# Patient Record
Sex: Female | Born: 1945
Health system: Southern US, Community
[De-identification: ages and names within clinical notes are randomized; demographics above are authoritative.]

## PROBLEM LIST (undated history)

## (undated) DIAGNOSIS — M069 Rheumatoid arthritis, unspecified: Secondary | ICD-10-CM

## (undated) DIAGNOSIS — F32A Depression, unspecified: Secondary | ICD-10-CM

## (undated) DIAGNOSIS — M1990 Unspecified osteoarthritis, unspecified site: Secondary | ICD-10-CM

## (undated) DIAGNOSIS — E559 Vitamin D deficiency, unspecified: Secondary | ICD-10-CM

## (undated) DIAGNOSIS — F419 Anxiety disorder, unspecified: Secondary | ICD-10-CM

## (undated) DIAGNOSIS — N390 Urinary tract infection, site not specified: Secondary | ICD-10-CM

## (undated) DIAGNOSIS — F410 Panic disorder [episodic paroxysmal anxiety] without agoraphobia: Secondary | ICD-10-CM

## (undated) DIAGNOSIS — K219 Gastro-esophageal reflux disease without esophagitis: Secondary | ICD-10-CM

## (undated) DIAGNOSIS — F329 Major depressive disorder, single episode, unspecified: Secondary | ICD-10-CM

## (undated) DIAGNOSIS — J111 Influenza due to unidentified influenza virus with other respiratory manifestations: Secondary | ICD-10-CM

## (undated) HISTORY — PX: COLONOSCOPY: SHX174

## (undated) HISTORY — DX: Vitamin D deficiency, unspecified: E55.9

## (undated) HISTORY — PX: ABDOMINAL HYSTERECTOMY: SHX81

## (undated) HISTORY — PX: OTHER SURGICAL HISTORY: SHX169

## (undated) HISTORY — PX: BRONCHOSCOPY: SUR163

## (undated) HISTORY — DX: Panic disorder (episodic paroxysmal anxiety): F41.0

---

## 1997-05-22 ENCOUNTER — Other Ambulatory Visit: Admission: RE | Admit: 1997-05-22 | Discharge: 1997-05-22 | Payer: Self-pay | Admitting: Gynecology

## 1998-08-12 ENCOUNTER — Other Ambulatory Visit: Admission: RE | Admit: 1998-08-12 | Discharge: 1998-08-12 | Payer: Self-pay | Admitting: Gynecology

## 1998-11-15 ENCOUNTER — Encounter: Payer: Self-pay | Admitting: Gynecology

## 1998-11-15 ENCOUNTER — Ambulatory Visit (HOSPITAL_COMMUNITY): Admission: RE | Admit: 1998-11-15 | Discharge: 1998-11-15 | Payer: Self-pay | Admitting: Gynecology

## 1999-10-29 ENCOUNTER — Other Ambulatory Visit: Admission: RE | Admit: 1999-10-29 | Discharge: 1999-10-29 | Payer: Self-pay | Admitting: Gynecology

## 2000-03-03 ENCOUNTER — Encounter: Payer: Self-pay | Admitting: Gynecology

## 2000-03-03 ENCOUNTER — Ambulatory Visit (HOSPITAL_COMMUNITY): Admission: RE | Admit: 2000-03-03 | Discharge: 2000-03-03 | Payer: Self-pay | Admitting: Gynecology

## 2000-10-28 ENCOUNTER — Other Ambulatory Visit: Admission: RE | Admit: 2000-10-28 | Discharge: 2000-10-28 | Payer: Self-pay | Admitting: Gynecology

## 2001-11-10 ENCOUNTER — Other Ambulatory Visit: Admission: RE | Admit: 2001-11-10 | Discharge: 2001-11-10 | Payer: Self-pay | Admitting: Gynecology

## 2002-06-09 ENCOUNTER — Encounter (INDEPENDENT_AMBULATORY_CARE_PROVIDER_SITE_OTHER): Payer: Self-pay | Admitting: *Deleted

## 2002-06-09 ENCOUNTER — Ambulatory Visit (HOSPITAL_COMMUNITY): Admission: RE | Admit: 2002-06-09 | Discharge: 2002-06-09 | Payer: Self-pay | Admitting: *Deleted

## 2002-06-13 ENCOUNTER — Encounter: Payer: Self-pay | Admitting: Gynecology

## 2002-06-13 ENCOUNTER — Ambulatory Visit (HOSPITAL_COMMUNITY): Admission: RE | Admit: 2002-06-13 | Discharge: 2002-06-13 | Payer: Self-pay | Admitting: Gynecology

## 2003-04-25 ENCOUNTER — Other Ambulatory Visit: Admission: RE | Admit: 2003-04-25 | Discharge: 2003-04-25 | Payer: Self-pay | Admitting: Gynecology

## 2003-06-14 ENCOUNTER — Ambulatory Visit (HOSPITAL_COMMUNITY): Admission: RE | Admit: 2003-06-14 | Discharge: 2003-06-14 | Payer: Self-pay | Admitting: Gynecology

## 2004-04-24 ENCOUNTER — Other Ambulatory Visit: Admission: RE | Admit: 2004-04-24 | Discharge: 2004-04-24 | Payer: Self-pay | Admitting: Gynecology

## 2004-09-29 ENCOUNTER — Ambulatory Visit (HOSPITAL_COMMUNITY): Admission: RE | Admit: 2004-09-29 | Discharge: 2004-09-29 | Payer: Self-pay | Admitting: Gynecology

## 2005-10-01 ENCOUNTER — Ambulatory Visit (HOSPITAL_COMMUNITY): Admission: RE | Admit: 2005-10-01 | Discharge: 2005-10-01 | Payer: Self-pay | Admitting: Gynecology

## 2005-10-07 ENCOUNTER — Other Ambulatory Visit: Admission: RE | Admit: 2005-10-07 | Discharge: 2005-10-07 | Payer: Self-pay | Admitting: Gynecology

## 2006-10-26 ENCOUNTER — Ambulatory Visit (HOSPITAL_COMMUNITY): Admission: RE | Admit: 2006-10-26 | Discharge: 2006-10-26 | Payer: Self-pay | Admitting: Gynecology

## 2007-04-05 ENCOUNTER — Other Ambulatory Visit: Admission: RE | Admit: 2007-04-05 | Discharge: 2007-04-05 | Payer: Self-pay | Admitting: Gynecology

## 2007-10-24 ENCOUNTER — Emergency Department (HOSPITAL_BASED_OUTPATIENT_CLINIC_OR_DEPARTMENT_OTHER): Admission: EM | Admit: 2007-10-24 | Discharge: 2007-10-24 | Payer: Self-pay | Admitting: Emergency Medicine

## 2010-02-13 ENCOUNTER — Ambulatory Visit (HOSPITAL_COMMUNITY)
Admission: RE | Admit: 2010-02-13 | Discharge: 2010-02-13 | Payer: Self-pay | Source: Home / Self Care | Attending: Gynecology | Admitting: Gynecology

## 2010-11-19 LAB — DIFFERENTIAL
Basophils Absolute: 0
Eosinophils Absolute: 0.1
Eosinophils Relative: 2
Lymphocytes Relative: 24
Monocytes Absolute: 0.4

## 2010-11-19 LAB — COMPREHENSIVE METABOLIC PANEL
ALT: 40 — ABNORMAL HIGH
AST: 50 — ABNORMAL HIGH
Albumin: 4.3
Alkaline Phosphatase: 104
Chloride: 105
Creatinine, Ser: 0.8
GFR calc Af Amer: >60
Potassium: 4.7
Sodium: 141
Total Bilirubin: 0.4

## 2010-11-19 LAB — POCT CARDIAC MARKERS
Troponin i, poc: <0.05
Troponin i, poc: <0.05

## 2010-11-19 LAB — CBC
Platelets: 175
RBC: 4.3
WBC: 4.6

## 2011-02-02 ENCOUNTER — Other Ambulatory Visit (HOSPITAL_COMMUNITY): Payer: Self-pay | Admitting: Gynecology

## 2011-02-02 DIAGNOSIS — Z1231 Encounter for screening mammogram for malignant neoplasm of breast: Secondary | ICD-10-CM

## 2011-02-13 ENCOUNTER — Other Ambulatory Visit: Payer: Self-pay

## 2011-02-13 ENCOUNTER — Emergency Department (HOSPITAL_COMMUNITY)
Admission: EM | Admit: 2011-02-13 | Discharge: 2011-02-13 | Disposition: A | Discharge status: Home / Self Care | Payer: Medicare Other | Attending: Emergency Medicine | Admitting: Emergency Medicine

## 2011-02-13 ENCOUNTER — Emergency Department (HOSPITAL_COMMUNITY): Payer: Medicare Other

## 2011-02-13 DIAGNOSIS — Z7982 Long term (current) use of aspirin: Secondary | ICD-10-CM | POA: Insufficient documentation

## 2011-02-13 DIAGNOSIS — Z79899 Other long term (current) drug therapy: Secondary | ICD-10-CM | POA: Insufficient documentation

## 2011-02-13 DIAGNOSIS — M129 Arthropathy, unspecified: Secondary | ICD-10-CM | POA: Insufficient documentation

## 2011-02-13 DIAGNOSIS — R002 Palpitations: Secondary | ICD-10-CM

## 2011-02-13 DIAGNOSIS — R42 Dizziness and giddiness: Secondary | ICD-10-CM | POA: Insufficient documentation

## 2011-02-13 HISTORY — DX: Unspecified osteoarthritis, unspecified site: M19.90

## 2011-02-13 LAB — URINALYSIS, ROUTINE W REFLEX MICROSCOPIC
Glucose, UA: NEGATIVE mg/dL
Specific Gravity, Urine: 1.003 — ABNORMAL LOW (ref 1.005–1.030)
pH: 7 (ref 5.0–8.0)

## 2011-02-13 LAB — CBC
HCT: 42.5 % (ref 36.0–46.0)
Platelets: 163 10*3/uL (ref 150–400)
RDW: 13.1 % (ref 11.5–15.5)
WBC: 7 10*3/uL (ref 4.0–10.5)

## 2011-02-13 LAB — BASIC METABOLIC PANEL
CO2: 27 mEq/L (ref 19–32)
Chloride: 106 mEq/L (ref 96–112)
Sodium: 143 mEq/L (ref 135–145)

## 2011-02-13 LAB — DIFFERENTIAL
Basophils Absolute: 0 10*3/uL (ref 0.0–0.1)
Lymphocytes Relative: 23 % (ref 12–46)
Monocytes Absolute: 0.5 10*3/uL (ref 0.1–1.0)
Neutro Abs: 4.8 10*3/uL (ref 1.7–7.7)

## 2011-02-13 LAB — URINE MICROSCOPIC-ADD ON

## 2011-02-13 LAB — POCT I-STAT TROPONIN I

## 2011-02-13 LAB — TSH: TSH: 0.907 u[IU]/mL (ref 0.350–4.500)

## 2011-02-13 NOTE — ED Provider Notes (Cosign Needed)
History     CSN: [id number]  Arrival date & time 02/13/11  1503   None     Chief Complaint  Patient presents with  . Dizziness  . Irregular Heart Beat    (Consider location/radiation/quality/duration/timing/severity/associated sxs/prior treatment) HPI Comments: The patient is a 65 year old woman who says she became very dizzy. It was a weird feeling. She's has an application on her cell phone which checks the pulse. She says her pulse was jumping between 40 and 180. Paramedics came and he said her pulse was followed with place also. 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 She feels better now. She says several months ago she had an episode where she felt kind of dizzy, and her pulse 102 150 but this resolved, and she did not seek medical evaluation at that time.  Patient is a 65 y.o. female presenting with palpitations.  Palpitations  This is a new problem. The current episode started less than 1 hour ago. Episode frequency: She had one episode of palpitations. The problem has been resolved. On average, each episode lasts 30 minutes. Associated symptoms include dizziness. She has tried nothing for the symptoms. Risk factors include no known risk factors (One sister has had atrial fibrillation.). Past medical history comments: One sister has thyroid problems..    Past Medical History  Diagnosis Date  . Arthritis     History reviewed. No pertinent past surgical history.  History reviewed. No pertinent family history.  History  Substance Use Topics  . Smoking status: Never Smoker   . Smokeless tobacco: Never Used  . Alcohol Use: Yes    OB History    Grav Para Term Preterm Abortions TAB SAB Ect Mult Living                  Review of Systems  Constitutional: Negative.   HENT: Negative.   Eyes: Negative.   Respiratory: Negative.   Cardiovascular: Positive for palpitations.  Gastrointestinal: Negative.   Genitourinary:  Negative.   Skin: Negative.   Neurological: Positive for dizziness.  Psychiatric/Behavioral: Negative.     Allergies  Review of patient's allergies indicates no known allergies.  Home Medications   Current Outpatient Rx  Name Route Sig Dispense Refill  . VITAMIN C PO Oral Take 1 tablet by mouth daily.      . ASPIRIN EC 325 MG PO TBEC Oral Take 325 mg by mouth daily.      Marland Kitchen CALCIUM PO Oral Take 1 tablet by mouth daily.      Marland Kitchen VITAMIN D 1000 UNITS PO TABS Oral Take 1,000 Units by mouth daily.      Marland Kitchen HYDROXYCHLOROQUINE SULFATE 200 MG PO TABS Oral Take by mouth daily.        BP 125/82  Pulse 93  Temp(Src) 98.1 F (36.7 C) (Oral)  Resp 20  SpO2 99%  Physical Exam  Nursing note and vitals reviewed. Constitutional: She is oriented to person, place, and time. She appears well-developed and well-nourished. No distress.  HENT:  Head: Normocephalic and atraumatic.  Right Ear: External ear normal.  Left Ear: External ear normal.  Mouth/Throat: Oropharynx is clear and moist.  Eyes: Conjunctivae and EOM are normal. Pupils are equal, round, and reactive to light.  Neck: Normal range of motion. Neck supple.       No carotid bruits.  Cardiovascular: Normal rate, regular rhythm and normal heart sounds.   Pulmonary/Chest: Effort normal and breath sounds normal.  Abdominal: Soft. Bowel sounds are normal.  Musculoskeletal: Normal range of motion.  Neurological: She is alert and oriented to person, place, and time.       Sensory or motor deficit.  Skin: Skin is warm and dry.  Psychiatric: She has a normal mood and affect. Her behavior is normal.    ED Course  Procedures (including critical care time)   Labs Reviewed  CBC  DIFFERENTIAL  BASIC METABOLIC PANEL  I-STAT TROPONIN I    3:27 PM  Date: 02/13/2011  Rate:87  Rhythm: normal sinus rhythm  QRS Axis: normal  Intervals: normal PQRS:  Left atrial abnormality  ST/T Wave abnormalities: normal  Conduction Disutrbances:none   Narrative Interpretation: Borderline EKG.  Old EKG Reviewed: unchanged Since tracing of 10/24/2007.  3:54 PM There were no tracings from the paramedics available to review. Review of her current monitoring shows no episodes of tachycardia. Laboratory evaluation was ordered.  5:20 PM Issues lab workup was negative. She has been asymptomatic throughout her visit. I will call [facility] to set up a time for her to be checked and possibly to have an event monitor placed.  1. Palpitations            Carleene Cooper III, MD 02/13/11 1730  Carleene Cooper III, MD 02/13/11 [phone]

## 2011-02-13 NOTE — ED Notes (Signed)
Patient was at work when she had onset of weakness and dizziness that she associated with an irregular feeling heartbeat. She states that at one point she felt very anxious and felt like there was some mild chest tightness and stated that she felt like she was having some pressure in her neck. With ems she was in atrial fib which is a new rhythm for her. Her rate ranged anywhere from 80-140 with ems. Pt already has taken 325mg  asa at home with her regular medications. She is alert and oriented. Iv started pta by ems. Protocols initiated upon arrival to dept.

## 2011-02-13 NOTE — ED Notes (Signed)
Patient was at work when she had onset of dizziness and weakness around 2pm today. Upon ems arrival her cbg was 79 but she was found to be in a-fib on their monitor. She denies any pain. Pt states that she just didn't feel right. Iv started pta by ems. Protocols initiated upon arrival to dept.

## 2011-02-26 ENCOUNTER — Ambulatory Visit (HOSPITAL_COMMUNITY): Payer: Medicare Other

## 2011-03-26 ENCOUNTER — Ambulatory Visit (HOSPITAL_COMMUNITY): Payer: Medicare Other

## 2011-04-07 ENCOUNTER — Ambulatory Visit (HOSPITAL_COMMUNITY)
Admission: RE | Admit: 2011-04-07 | Discharge: 2011-04-07 | Disposition: A | Discharge status: Home / Self Care | Payer: Medicare Other | Source: Ambulatory Visit | Attending: Gynecology | Admitting: Gynecology

## 2011-04-07 DIAGNOSIS — Z1231 Encounter for screening mammogram for malignant neoplasm of breast: Secondary | ICD-10-CM | POA: Insufficient documentation

## 2012-08-15 ENCOUNTER — Other Ambulatory Visit (HOSPITAL_COMMUNITY): Payer: Self-pay | Admitting: Gynecology

## 2012-08-15 DIAGNOSIS — Z1231 Encounter for screening mammogram for malignant neoplasm of breast: Secondary | ICD-10-CM

## 2012-08-30 ENCOUNTER — Ambulatory Visit (HOSPITAL_COMMUNITY)
Admission: RE | Admit: 2012-08-30 | Discharge: 2012-08-30 | Disposition: A | Discharge status: Home / Self Care | Payer: Medicare Other | Source: Ambulatory Visit | Attending: Gynecology | Admitting: Gynecology

## 2012-08-30 DIAGNOSIS — Z1231 Encounter for screening mammogram for malignant neoplasm of breast: Secondary | ICD-10-CM

## 2012-12-03 ENCOUNTER — Emergency Department (HOSPITAL_BASED_OUTPATIENT_CLINIC_OR_DEPARTMENT_OTHER)
Admission: EM | Admit: 2012-12-03 | Discharge: 2012-12-03 | Disposition: A | Discharge status: Home / Self Care | Payer: Medicare Other | Attending: Emergency Medicine | Admitting: Emergency Medicine

## 2012-12-03 ENCOUNTER — Encounter (HOSPITAL_BASED_OUTPATIENT_CLINIC_OR_DEPARTMENT_OTHER): Payer: Self-pay | Admitting: Emergency Medicine

## 2012-12-03 ENCOUNTER — Emergency Department (HOSPITAL_BASED_OUTPATIENT_CLINIC_OR_DEPARTMENT_OTHER): Payer: Medicare Other

## 2012-12-03 DIAGNOSIS — N390 Urinary tract infection, site not specified: Secondary | ICD-10-CM | POA: Insufficient documentation

## 2012-12-03 DIAGNOSIS — R109 Unspecified abdominal pain: Secondary | ICD-10-CM

## 2012-12-03 DIAGNOSIS — Z79899 Other long term (current) drug therapy: Secondary | ICD-10-CM | POA: Insufficient documentation

## 2012-12-03 DIAGNOSIS — M129 Arthropathy, unspecified: Secondary | ICD-10-CM | POA: Insufficient documentation

## 2012-12-03 LAB — COMPREHENSIVE METABOLIC PANEL
ALT: 59 U/L — ABNORMAL HIGH (ref 0–35)
AST: 65 U/L — ABNORMAL HIGH (ref 0–37)
Alkaline Phosphatase: 86 U/L (ref 39–117)
CO2: 24 mEq/L (ref 19–32)
Chloride: 101 mEq/L (ref 96–112)
GFR calc non Af Amer: 74 mL/min — ABNORMAL LOW (ref >90)
Sodium: 137 mEq/L (ref 135–145)
Total Bilirubin: 0.6 mg/dL (ref 0.3–1.2)

## 2012-12-03 LAB — CBC WITH DIFFERENTIAL/PLATELET
Basophils Absolute: 0 10*3/uL (ref 0.0–0.1)
HCT: 40.3 % (ref 36.0–46.0)
Lymphocytes Relative: 33 % (ref 12–46)
Neutro Abs: 3.1 10*3/uL (ref 1.7–7.7)
Platelets: 191 10*3/uL (ref 150–400)
RBC: 4.37 MIL/uL (ref 3.87–5.11)
RDW: 12.9 % (ref 11.5–15.5)
WBC: 5.5 10*3/uL (ref 4.0–10.5)

## 2012-12-03 LAB — URINALYSIS, ROUTINE W REFLEX MICROSCOPIC
Glucose, UA: NEGATIVE mg/dL
Protein, ur: NEGATIVE mg/dL
Specific Gravity, Urine: 1.005 (ref 1.005–1.030)

## 2012-12-03 LAB — URINE MICROSCOPIC-ADD ON

## 2012-12-03 MED ORDER — IOHEXOL 300 MG/ML  SOLN
100.0000 mL | Freq: Once | INTRAMUSCULAR | Status: AC | PRN
  Administered 2012-12-03: 100 mL via INTRAVENOUS

## 2012-12-03 MED ORDER — IOHEXOL 300 MG/ML  SOLN
50.0000 mL | Freq: Once | INTRAMUSCULAR | Status: AC | PRN
  Administered 2012-12-03: 50 mL via ORAL

## 2012-12-03 MED ORDER — SODIUM CHLORIDE 0.9 % IV BOLUS (SEPSIS)
500.0000 mL | Freq: Once | INTRAVENOUS | Status: AC
  Administered 2012-12-03: 500 mL via INTRAVENOUS

## 2012-12-03 MED ORDER — CEPHALEXIN 500 MG PO CAPS: ORAL_CAPSULE | ORAL | Status: DC

## 2012-12-03 NOTE — ED Notes (Signed)
Pt having abdominal pain and bloating since august.  Pt seen numerous times at [facility].  Pt treated for UTI.  Pt had abdominal xray.  Pt continues to have cramping.  Pt took laxative, had good results, but continues with cramping.

## 2012-12-03 NOTE — ED Provider Notes (Signed)
CSN: [id number]     Arrival date & time 12/03/12  1539 History  This chart was scribed for Hurman Horn, MD by Danella Maiers, ED Scribe. This patient was seen in room MH03/MH03 and the patient's care was started at 4:03 PM.   Chief Complaint  Patient presents with  . Abdominal Pain  . Dysuria   HPI HPI Comments: [name] [name] is a 67 y.o. female who presents to the Emergency Department complaining of dull, intermittent abdominal pain that radiates to the back for the past few months that worsened for a few hours yesterday but is now improved. She explains it as a "pressure" pain. It is mild, diffuse, lasts from hours at a time to all day. She has noticed a change in her bowel habits over the last month where she has had small caliber stools in a much smaller amount. She had an abdominal x-ray that was normal except for constipation and a pelvic US that was normal. She also reports dysuria and has recently been treated by her PCP for UTI. She denies emesis, diarrhea, blood in her stool, CP, SOB, fevers, hallucinations. She is otherwise healthy. She denies operations on her abdomen. She denies alcohol or tobacco use.    Past Medical History  Diagnosis Date  . Arthritis    History reviewed. No pertinent past surgical history. History reviewed. No pertinent family history. History  Substance Use Topics  . Smoking status: Never Smoker   . Smokeless tobacco: Never Used  . Alcohol Use: Yes   OB History   Grav Para Term Preterm Abortions TAB SAB Ect Mult Living                 Review of Systems 10 Systems reviewed and are negative for acute change except as noted in the HPI. Allergies  Review of patient's allergies indicates no known allergies.  Home Medications   Current Outpatient Rx  Name  Route  Sig  Dispense  Refill  . Ascorbic Acid (VITAMIN C PO)   Oral   Take 1 tablet by mouth daily.           Marland Kitchen CALCIUM PO   Oral   Take 1 tablet by mouth daily.           .  cephALEXin (KEFLEX) 500 MG capsule      2 caps po bid x 7 days   28 capsule   0   . cholecalciferol (VITAMIN D) 1000 UNITS tablet   Oral   Take 1,000 Units by mouth daily.           . hydroxychloroquine (PLAQUENIL) 200 MG tablet   Oral   Take by mouth daily.            BP 139/86  Pulse 93  Temp(Src) 98.1 F (36.7 C) (Oral)  Resp 16  Ht 5\' 8"  (1.727 m)  Wt 148 lb (67.132 kg)  BMI 22.51 kg/m2  SpO2 99% Physical Exam  Nursing note and vitals reviewed. Constitutional:  Awake, alert, nontoxic appearance.  HENT:  Head: Atraumatic.  Eyes: Right eye exhibits no discharge. Left eye exhibits no discharge.  Neck: Neck supple.  Cardiovascular: Normal rate and regular rhythm.   No murmur heard. Pulmonary/Chest: Effort normal and breath sounds normal. No respiratory distress. She has no wheezes. She has no rales. She exhibits no tenderness.  Abdominal: Soft. Bowel sounds are normal. She exhibits no distension and no mass. There is no tenderness. There is no rebound and  no guarding.  Genitourinary:  No CVA tenderness  Musculoskeletal: She exhibits no tenderness.  Baseline ROM, no obvious new focal weakness. Back nontender  Neurological:  Mental status and motor strength appears baseline for patient and situation.  Skin: No rash noted.  Psychiatric: She has a normal mood and affect.    ED Course  Procedures (including critical care time) Patient / Family / Caregiver understand and agree with initial ED impression and plan with expectations set for ED visit. Pt prefers CT while in ED. Medications  sodium chloride 0.9 % bolus 500 mL (500 mLs Intravenous New Bag/Given 12/03/12 1654)  iohexol (OMNIPAQUE) 300 MG/ML solution 50 mL (50 mLs Oral Contrast Given 12/03/12 1734)  iohexol (OMNIPAQUE) 300 MG/ML solution 100 mL (100 mLs Intravenous Contrast Given 12/03/12 1734)    DIAGNOSTIC STUDIES: Oxygen Saturation is 99% on RA, normal by my interpretation.    COORDINATION OF  CARE: 4:10 PM- Discussed treatment plan with pt which includes CT abdomen. Pt agrees to plan.    Labs Review Labs Reviewed  URINALYSIS, ROUTINE W REFLEX MICROSCOPIC - Abnormal; Notable for the following:    APPearance CLOUDY (*)    Hgb urine dipstick TRACE (*)    Leukocytes, UA LARGE (*)    All other components within normal limits  COMPREHENSIVE METABOLIC PANEL - Abnormal; Notable for the following:    AST 65 (*)    ALT 59 (*)    GFR calc non Af Amer 74 (*)    GFR calc Af Amer 86 (*)    All other components within normal limits  URINE MICROSCOPIC-ADD ON - Abnormal; Notable for the following:    Bacteria, UA MANY (*)    All other components within normal limits  URINE CULTURE  CBC WITH DIFFERENTIAL  LIPASE, BLOOD   Imaging Review No results found.  EKG Interpretation   None       MDM   1. Abdominal pain   2. UTI (urinary tract infection)    I doubt any other EMC precluding discharge at this time including, but not necessarily limited to the following:peritonitis.    I personally performed the services described in this documentation, which was scribed in my presence. The recorded information has been reviewed and is accurate.    Hurman Horn, MD 12/18/12 2037

## 2012-12-05 LAB — URINE CULTURE: Culture: NO GROWTH

## 2012-12-12 ENCOUNTER — Encounter: Payer: Self-pay | Admitting: Gastroenterology

## 2012-12-22 ENCOUNTER — Other Ambulatory Visit: Payer: Self-pay

## 2012-12-26 ENCOUNTER — Ambulatory Visit: Payer: Medicare Other | Admitting: Gastroenterology

## 2014-05-14 ENCOUNTER — Other Ambulatory Visit (HOSPITAL_COMMUNITY): Payer: Self-pay | Admitting: Gynecology

## 2014-05-14 DIAGNOSIS — Z1231 Encounter for screening mammogram for malignant neoplasm of breast: Secondary | ICD-10-CM

## 2014-05-18 ENCOUNTER — Ambulatory Visit (HOSPITAL_COMMUNITY)
Admission: RE | Admit: 2014-05-18 | Discharge: 2014-05-18 | Disposition: A | Discharge status: Home / Self Care | Payer: Medicare Other | Source: Ambulatory Visit | Attending: Gynecology | Admitting: Gynecology

## 2014-05-18 DIAGNOSIS — Z1231 Encounter for screening mammogram for malignant neoplasm of breast: Secondary | ICD-10-CM | POA: Insufficient documentation

## 2014-10-04 ENCOUNTER — Observation Stay (HOSPITAL_BASED_OUTPATIENT_CLINIC_OR_DEPARTMENT_OTHER)
Admission: EM | Admit: 2014-10-04 | Discharge: 2014-10-05 | Disposition: A | Discharge status: Home / Self Care | Payer: Medicare Other | Attending: Cardiology | Admitting: Cardiology

## 2014-10-04 ENCOUNTER — Encounter (HOSPITAL_BASED_OUTPATIENT_CLINIC_OR_DEPARTMENT_OTHER): Payer: Self-pay | Admitting: Emergency Medicine

## 2014-10-04 ENCOUNTER — Emergency Department (HOSPITAL_BASED_OUTPATIENT_CLINIC_OR_DEPARTMENT_OTHER): Payer: Medicare Other

## 2014-10-04 DIAGNOSIS — R079 Chest pain, unspecified: Secondary | ICD-10-CM

## 2014-10-04 DIAGNOSIS — R42 Dizziness and giddiness: Principal | ICD-10-CM

## 2014-10-04 DIAGNOSIS — R55 Syncope and collapse: Secondary | ICD-10-CM | POA: Diagnosis present

## 2014-10-04 DIAGNOSIS — R9431 Abnormal electrocardiogram [ECG] [EKG]: Secondary | ICD-10-CM

## 2014-10-04 HISTORY — DX: Anxiety disorder, unspecified: F41.9

## 2014-10-04 LAB — CBC
HCT: 41.3 % (ref 36.0–46.0)
Hemoglobin: 14 g/dL (ref 12.0–15.0)
MCH: 32.4 pg (ref 26.0–34.0)
MCHC: 33.9 g/dL (ref 30.0–36.0)
MCV: 95.6 fL (ref 78.0–100.0)
Platelets: 166 10*3/uL (ref 150–400)
RBC: 4.32 MIL/uL (ref 3.87–5.11)
RDW: 13.5 % (ref 11.5–15.5)
WBC: 5.9 10*3/uL (ref 4.0–10.5)

## 2014-10-04 LAB — BASIC METABOLIC PANEL
ANION GAP: 13 (ref 5–15)
BUN: 13 mg/dL (ref 6–20)
CHLORIDE: 105 mmol/L (ref 101–111)
CO2: 21 mmol/L — AB (ref 22–32)
Calcium: 9.1 mg/dL (ref 8.9–10.3)
Creatinine, Ser: 0.67 mg/dL (ref 0.44–1.00)
GFR calc non Af Amer: >60 mL/min (ref >60)
GLUCOSE: 91 mg/dL (ref 65–99)
POTASSIUM: 3.8 mmol/L (ref 3.5–5.1)
Sodium: 139 mmol/L (ref 135–145)

## 2014-10-04 LAB — TROPONIN I: Troponin I: <0.03 ng/mL (ref <0.031)

## 2014-10-04 MED ORDER — ASPIRIN 81 MG PO CHEW
324.0000 mg | CHEWABLE_TABLET | Freq: Once | ORAL | Status: AC
  Administered 2014-10-04: 324 mg via ORAL
  Filled 2014-10-04: qty 4

## 2014-10-04 MED ORDER — NITROGLYCERIN 2 % TD OINT
1.0000 [in_us] | TOPICAL_OINTMENT | Freq: Once | TRANSDERMAL | Status: AC
  Administered 2014-10-04: 1 [in_us] via TOPICAL
  Filled 2014-10-04: qty 1

## 2014-10-04 MED ORDER — ACETAMINOPHEN 500 MG PO TABS
1000.0000 mg | ORAL_TABLET | Freq: Once | ORAL | Status: AC
  Administered 2014-10-04: 1000 mg via ORAL
  Filled 2014-10-04: qty 2

## 2014-10-04 NOTE — ED Notes (Signed)
MD at bedside. 

## 2014-10-04 NOTE — ED Provider Notes (Signed)
CSN: [id number]     Arrival date & time 10/04/14  1736 History   First MD Initiated Contact with Patient 10/04/14 1740     Chief Complaint  Patient presents with  . Weakness     (Consider location/radiation/quality/duration/timing/severity/associated sxs/prior Treatment) HPI Comments: Was driving home about 45 minutes ago and had acute onset dizziness with associated L arm weakness and jaw pressure. Was hypertensive at home in the 170s.  No medical history other than some depression. She is not on any meds for HTN, HLD.   Patient is a 69 y.o. female presenting with weakness. The history is provided by the patient.  Weakness This is a new problem. The problem occurs constantly. The problem has been rapidly improving. Pertinent negatives include no chest pain and no shortness of breath. Nothing aggravates the symptoms. Nothing relieves the symptoms.    Past Medical History  Diagnosis Date  . Arthritis    History reviewed. No pertinent past surgical history. No family history on file. Social History  Substance Use Topics  . Smoking status: Never Smoker   . Smokeless tobacco: Never Used  . Alcohol Use: Yes   OB History    No data available     Review of Systems  Constitutional: Negative for fever.  Respiratory: Negative for shortness of breath.   Cardiovascular: Negative for chest pain and leg swelling.  Neurological: Positive for weakness.  All other systems reviewed and are negative.     Allergies  Review of patient's allergies indicates no known allergies.  Home Medications   Prior to Admission medications   Medication Sig Start Date End Date Taking? Authorizing Provider  citalopram (CELEXA) 10 MG tablet Take 10 mg by mouth daily.   Yes Historical Provider, MD  Ascorbic Acid (VITAMIN C PO) Take 1 tablet by mouth daily.      Historical Provider, MD  CALCIUM PO Take 1 tablet by mouth daily.      Historical Provider, MD  cephALEXin (KEFLEX) 500 MG capsule 2 caps po  bid x 7 days 12/03/12   Riki Altes, MD  cholecalciferol (VITAMIN D) 1000 UNITS tablet Take 1,000 Units by mouth daily.      Historical Provider, MD  hydroxychloroquine (PLAQUENIL) 200 MG tablet Take by mouth daily.      Historical Provider, MD   BP 174/99 mmHg  Pulse 86  Temp(Src) 98.1 F (36.7 C) (Oral)  Resp 16  Ht 5\' 8"  (1.727 m)  Wt 145 lb (65.772 kg)  BMI 22.05 kg/m2  SpO2 100% Physical Exam  Constitutional: She is oriented to person, place, and time. She appears well-developed and well-nourished. No distress.  HENT:  Head: Normocephalic and atraumatic.  Mouth/Throat: Oropharynx is clear and moist.  Eyes: EOM are normal. Pupils are equal, round, and reactive to light.  Neck: Normal range of motion. Neck supple.  Cardiovascular: Normal rate and regular rhythm.  Exam reveals no friction rub.   No murmur heard. Pulmonary/Chest: Effort normal and breath sounds normal. No respiratory distress. She has no wheezes. She has no rales.  Abdominal: Soft. She exhibits no distension. There is no tenderness. There is no rebound.  Musculoskeletal: Normal range of motion. She exhibits no edema.  Neurological: She is alert and oriented to person, place, and time.  Skin: No rash noted. She is not diaphoretic.  Nursing note and vitals reviewed.   ED Course  Procedures (including critical care time) Labs Review Labs Reviewed  CBC  BASIC METABOLIC PANEL  TROPONIN I  Imaging Review Dg Chest 2 View  10/04/2014   CLINICAL DATA:  Hypertension and syncope  EXAM: CHEST  2 VIEW  COMPARISON:  February 13, 2011  FINDINGS: There is no edema or consolidation. The heart size and pulmonary vascularity are normal. No adenopathy. No bone lesions.  IMPRESSION: No edema or consolidation.   Electronically Signed   By: Lowella Grip III M.D.   On: 10/04/2014 18:40   I have personally reviewed and evaluated these images and lab results as part of my medical decision-making.   EKG  Interpretation   Date/Time:  Thursday October 04 2014 17:47:30 EDT Ventricular Rate:  86 PR Interval:  138 QRS Duration: 88 QT Interval:  374 QTC Calculation: 447 R Axis:   39 Text Interpretation:  Normal sinus rhythm T wave abnormality, consider  anterior ischemia Abnormal ECG Confirmed by Mingo Amber  MD, [place] ([id number]) on  10/04/2014 5:51:52 PM      MDM   Final diagnoses:  Dizziness  EKG, abnormal    69 year old female here with story concerning for ACS. Was driving and acutely became dizzy and had some associated left arm weakness and jaw pressure. She is feeling a little better but still feeling mildly bad. Did not denies any chest pain, chest pressure, shortness of breath. Here has an EKG with T-wave inversions anteriorly. This is new from previous. She is mildly hypertensive at 175/99. Will give aspirin, Nitropaste. Will plan on admission  Labs ok. Dr Wynonia Lawman with Cards consulted, will admit. Transferred to [employer].  Evelina Bucy, MD 10/04/14 [phone]

## 2014-10-04 NOTE — ED Notes (Addendum)
Pt driving home from work and started feeling dizzy and "like I was going to pass out".  Weakness in left arm and "wierd feeling in jaws".

## 2014-10-05 ENCOUNTER — Observation Stay (HOSPITAL_COMMUNITY): Payer: Medicare Other

## 2014-10-05 ENCOUNTER — Encounter (HOSPITAL_COMMUNITY): Payer: Medicare Other

## 2014-10-05 ENCOUNTER — Other Ambulatory Visit: Payer: Self-pay | Admitting: Cardiology

## 2014-10-05 DIAGNOSIS — R55 Syncope and collapse: Secondary | ICD-10-CM | POA: Diagnosis not present

## 2014-10-05 DIAGNOSIS — R9431 Abnormal electrocardiogram [ECG] [EKG]: Secondary | ICD-10-CM | POA: Diagnosis not present

## 2014-10-05 DIAGNOSIS — R079 Chest pain, unspecified: Secondary | ICD-10-CM

## 2014-10-05 DIAGNOSIS — R42 Dizziness and giddiness: Secondary | ICD-10-CM

## 2014-10-05 LAB — LIPID PANEL
Cholesterol: 209 mg/dL — ABNORMAL HIGH (ref 0–200)
HDL: 76 mg/dL (ref >40)
LDL Cholesterol: 125 mg/dL — ABNORMAL HIGH (ref 0–99)
Total CHOL/HDL Ratio: 2.8 RATIO
Triglycerides: 42 mg/dL (ref <150)
VLDL: 8 mg/dL (ref 0–40)

## 2014-10-05 LAB — TROPONIN I: TROPONIN I: 0.03 ng/mL (ref <0.031)

## 2014-10-05 LAB — TSH: TSH: 2.172 u[IU]/mL (ref 0.350–4.500)

## 2014-10-05 MED ORDER — TECHNETIUM TC 99M SESTAMIBI GENERIC - CARDIOLITE
10.0000 | Freq: Once | INTRAVENOUS | Status: AC | PRN
  Administered 2014-10-05: 10 via INTRAVENOUS

## 2014-10-05 MED ORDER — ACETAMINOPHEN 325 MG PO TABS: 650.0000 mg | ORAL_TABLET | ORAL | Status: DC | PRN

## 2014-10-05 MED ORDER — CITALOPRAM HYDROBROMIDE 10 MG PO TABS
10.0000 mg | ORAL_TABLET | Freq: Every day | ORAL | Status: DC
  Administered 2014-10-05: 10 mg via ORAL
  Filled 2014-10-05: qty 1

## 2014-10-05 MED ORDER — ZOLPIDEM TARTRATE 5 MG PO TABS
5.0000 mg | ORAL_TABLET | Freq: Every evening | ORAL | Status: DC | PRN
  Administered 2014-10-05: 5 mg via ORAL
  Filled 2014-10-05: qty 1

## 2014-10-05 MED ORDER — ONDANSETRON HCL 4 MG/2ML IJ SOLN: 4.0000 mg | Freq: Four times a day (QID) | INTRAMUSCULAR | Status: DC | PRN

## 2014-10-05 MED ORDER — HYDROXYCHLOROQUINE SULFATE 200 MG PO TABS
200.0000 mg | ORAL_TABLET | Freq: Every day | ORAL | Status: DC
  Administered 2014-10-05: 200 mg via ORAL
  Filled 2014-10-05: qty 1

## 2014-10-05 MED ORDER — TECHNETIUM TC 99M SESTAMIBI GENERIC - CARDIOLITE
30.0000 | Freq: Once | INTRAVENOUS | Status: AC | PRN
  Administered 2014-10-05: 30 via INTRAVENOUS

## 2014-10-05 NOTE — Discharge Summary (Signed)
Physician Discharge Summary  Patient ID: KAMEKA WHAN MRN: [id number] DOB/AGE: [birth year] 69 y.o.   Primary Cardiologist: Dr. Johnsie Cancel  Admit date: 10/04/2014 Discharge date: 10/05/2014  Admission Diagnoses: Pre-syncope/ Jaw + left arm pain  Discharge Diagnoses:  Active Problems:   Dizziness   Pre-syncope   Discharged Condition: stable  Hospital Course: 69 y/o female without significant PMH except for panic attacks and RA. She also notes she has been under a great deal of emotional stress lately. She lost her daughter who died at age 55 and subsequently she has increased her alcohol consumption. No personal history of HTN, DM or tobacco use. No family h/o CAD.  She initially presented to [facility] on 10/04/14 with complaints of dizziness, near syncope, left jaw discomfort and left arm weakness. SBP was elevated in the 170s-190s. She was then transferred to [facility]. On arrival to the ED, her jaw pain and arm weakness had resolved. She was given NTG in the ED and her BP improved. She was admitted to telemetry. Cardiac enzymes were cycled and negative x 3. Her EKG showed slight anterolateral Twave changes, however she underwent an exercise NST w/o exertional symptoms and images showed no ischemia. EF was estimated at 80%. She had no further issues with HTN. She required no additional medications. SBP remained stable in the 120s-130s.   She denied any recurrent dizziness, near syncope, jaw pain or upper arm weakness. She was felt stable for discharge. An outpatient 2D echo and office f/u was ordered.   Consults: None  Significant Diagnostic Studies:  NST 10/04/16 FINDINGS: Perfusion: No decreased activity in the left ventricle on stress imaging to suggest reversible ischemia or infarction.  Wall Motion: Normal left ventricular wall motion. No left ventricular dilation.  Left Ventricular Ejection Fraction: 80 %  End diastolic volume 63 ml  End systolic volume 13 ml  IMPRESSION: 1. No  reversible ischemia or infarction.  2. Normal left ventricular wall motion.  3. Left ventricular ejection fraction of 80%  4. Low-risk stress test findings*.   Treatments: See Hospital   Discharge Exam: Blood pressure 128/68, pulse 94, temperature 98.2 F (36.8 C), temperature source Oral, resp. rate 16, height 5\' 8"  (1.727 m), weight 145 lb 14.4 oz (66.18 kg), SpO2 98 %.   Disposition: 01-Home or Self Care      Discharge Instructions    Diet - low sodium heart healthy    Complete by:  As directed      Increase activity slowly    Complete by:  As directed             Medication List    STOP taking these medications        cephALEXin 500 MG capsule  Commonly known as:  KEFLEX      TAKE these medications        CALCIUM PO  Take 1 tablet by mouth daily.     cholecalciferol 1000 UNITS tablet  Commonly known as:  VITAMIN D  Take 1,000 Units by mouth daily.     citalopram 10 MG tablet  Commonly known as:  CELEXA  Take 10 mg by mouth daily.     hydroxychloroquine 200 MG tablet  Commonly known as:  PLAQUENIL  Take by mouth daily.     VITAMIN C PO  Take 1 tablet by mouth daily.       Follow-up Information    Follow up with [facility] On 10/12/2014.   Why:  for ultrasound of heart @  4:00 pm    Contact information:   [place] [address] [address]       Follow up with Tarri Fuller, PA-C On 10/31/2014.   Specialties:  Physician Assistant, Radiology, Interventional Cardiology   Why:  3:30 pm    Contact information:   [address] [place] [address] [phone]     TIME SPENT ON DISCHARGE, INCLUDING PHYSICIAN TIME: >30 MINUTES  Signed: Lyda Jester 10/05/2014, 3:33 PM

## 2014-10-05 NOTE — Progress Notes (Signed)
Patient Profile: 69 yo female with h/o panic attacks, RA and significant emotional stress recently, presenting with dizziness/presyncope, left arm weakness and jaw discomfort. Recent emotional stress in the setting of the passing of her 13 y/o daughter.   Subjective: No recurrent symptoms overnight. Denies CP.   Objective: Vital signs in last 24 hours: Temp:  [97.9 F (36.6 C)-98.3 F (36.8 C)] 98.2 F (36.8 C) (08/19 0734) Pulse Rate:  [58-86] 62 (08/19 0734) Resp:  [16-18] 16 (08/19 0734) BP: (115-174)/(68-99) 126/68 mmHg (08/19 0734) SpO2:  [93 %-100 %] 98 % (08/19 0734) Weight:  [145 lb (65.772 kg)-145 lb 14.4 oz (66.18 kg)] 145 lb 14.4 oz (66.18 kg) (08/18 2151) Last BM Date: 10/04/14  Intake/Output from previous day: 08/18 0701 - 08/19 0700 In: 240 [P.O.:240] Out: -  Intake/Output this shift:    Medications Current Facility-Administered Medications  Medication Dose Route Frequency Provider Last Rate Last Dose  . acetaminophen (TYLENOL) tablet 650 mg  650 mg Oral Q4H PRN Alphia Moh, MD      . citalopram (CELEXA) tablet 10 mg  10 mg Oral Daily Alphia Moh, MD   10 mg at 10/05/14 0807  . hydroxychloroquine (PLAQUENIL) tablet 200 mg  200 mg Oral Daily Alphia Moh, MD   200 mg at 10/05/14 0806  . ondansetron (ZOFRAN) injection 4 mg  4 mg Intravenous Q6H PRN Alphia Moh, MD      . zolpidem (AMBIEN) tablet 5 mg  5 mg Oral QHS PRN Alphia Moh, MD   5 mg at 10/05/14 0101    PE: General appearance: alert, cooperative and no distress Neck: no carotid bruit and no JVD Lungs: clear to auscultation bilaterally Heart: regular rate and rhythm, S1, S2 normal, no murmur, click, rub or gallop Extremities: no LEE Pulses: 2+ and symmetric Skin: warm and dry Neurologic: Grossly normal  Lab Results:   Recent Labs  10/04/14 1755  WBC 5.9  HGB 14.0  HCT 41.3  PLT 166   BMET  Recent Labs  10/04/14 1755  NA 139  K 3.8  CL 105  CO2 21*    GLUCOSE 91  BUN 13  CREATININE 0.67  CALCIUM 9.1   PT/INR No results for input(s): LABPROT, INR in the last 72 hours. Cholesterol  Recent Labs  10/05/14 0217  CHOL 209*   Cardiac Panel (last 3 results)  Recent Labs  10/04/14 1755 10/05/14 0217  TROPONINI <0.03 0.03    Assessment/Plan  Active Problems:   Dizziness   Pre-syncope    1. Left Jaw Pain/ left arm weakness: ? If anginal equivalent. Cardiac enzymes are negative x 3. Currently pain free.  However, given risk factors of HTN and HLD (LDL 125), plan for NST to rule out ischemia.   2. Dizziness/ Pre-syncope: in the setting of HTN with SBP in the 170s on arrival. Resolved after improvement in BP. No arrhthymias on telemetry.   3. HTN: SBP on arrival was 170. Improved in the ED after NTG. BP remained stable overnight w/o any additional treatment.   4. HLD: LDL elevated at 125. Consider initiation of low dose statin. However, may also be reasonable to address through lifestyle modification including modified diet and increase in physical activity, if negative ischemic w/u.     Brittainy M. Ladoris Gene 10/05/2014 8:07 AM  Patient in nuclear getting myovue.  R/O doubt CAD but ECG is abnormal with anterolateral T wave changes Agree with ischemic evaluation and echo Telemetry has been fine with no arrhythmia  Jenkins Rouge

## 2014-10-05 NOTE — Progress Notes (Signed)
     Patient seen in nuclear department for exercise nuclear stress test. Resting ECG with inferior and anterior T wave inversions.  Stress portion went well with no sx and no significant ST or TW changes on ECG worrisome for ischemia. Awaiting nuclear images.    Angelena Form PA-C  [facility]

## 2014-10-05 NOTE — H&P (Signed)
HPI: 69 yo woman without significant PMH except for panic attacks and RA who presents from [facility] after being accepted earlier today by Dr. Wynonia Lawman.  She presented to [facility] with cc of dizziness.  She describes this as "I was going to black out".  Episode started while driving home approaching [facility].  She [place] a [place] and placed the car in park while train passed.  Symptoms eased off a bit and she continued home.  Upon arrival she was still dizzy.  Husband checked BP and was elevated to 170s. She had associated left arm weakness and mild jaw discomfort.  She has had dizzy spells similar to this in the past and attributes them to "panic attacks".  However, she has never had the arm weakness nor jaw discomfort before which prompted her to visit ED.  Arm weakness resolved prior to ED arrival.  In ED, she got ASA and NTG.  Dizziness resolved and BP normalized.    She also reports lots of stress.  Her youngest daughter recently passed away and she has had a hard time dealing with this, I believe she was 69 yo.  Apparently having problems with her daughter's fiancee as well.    She also reports having a cardiac evaluation done in [place], [place] [address], about 2 years ago which consisted of ECG, echo, stress and monitor without significant findings.    Review of Systems:     Cardiac Review of Systems: {Y] = yes [ ]  = no  Chest Pain [    ]  Resting SOB [   ] Exertional SOB  [  ]  Orthopnea [  ]   Pedal Edema [   ]    Palpitations [  ] Syncope  [  ]   Presyncope [ x  ]  General Review of Systems: [Y] = yes [  ]=no Constitional: recent weight change [  ]; anorexia [  ]; fatigue [  ]; nausea [  ]; night sweats [  ]; fever [  ]; or chills [  ];                                                                      Dental: poor dentition[  ];   Eye : blurred vision [  ]; diplopia [   ]; vision changes [  ];  Amaurosis fugax[  ]; Resp: cough [  ];  wheezing[  ];  hemoptysis[  ]; shortness of  breath[  ]; paroxysmal nocturnal dyspnea[  ]; dyspnea on exertion[  ]; or orthopnea[  ];  GI:  gallstones[  ], vomiting[  ];  dysphagia[  ]; melena[  ];  hematochezia [  ]; heartburn[  ];   GU: kidney stones [  ]; hematuria[  ];   dysuria [  ];  nocturia[  ];               Skin: rash [  ], swelling[  ];, hair loss[  ];  peripheral edema[  ];  or itching[  ]; Musculosketetal: myalgias[  ];  joint swelling[  ];  joint erythema[  ];  joint pain[  ];  back pain[  ];  Heme/Lymph: bruising[  ];  bleeding[  ];  anemia[  ];  Neuro: TIA[  ];  headaches[  ];  stroke[  ];  vertigo[  ];  seizures[  ];   paresthesias[  ];  difficulty walking[  ];  Psych:depression[  ]; anxiety[  ];  Endocrine: diabetes[  ];  thyroid dysfunction[  ];  Other:  Past Medical History  Diagnosis Date  . Arthritis   . Anxiety disorder      MEDS:  Only taking celexa and plaquenil   No Known Allergies  Social History   Social History  . Marital Status: Married    Spouse Name: N/A  . Number of Children: N/A  . Years of Education: N/A   Occupational History  . Not on file.   Social History Main Topics  . Smoking status: Never Smoker   . Smokeless tobacco: Never Used  . Alcohol Use: Yes     Comment: 2-3 glasses of wine some days "I probably drink  more than I should"  . Drug Use: No  . Sexual Activity: Yes    Birth Control/ Protection: Post-menopausal   Other Topics Concern  . Not on file   Social History Narrative    No family history on file.  PHYSICAL EXAM: Filed Vitals:   10/04/14 2151  BP: 121/78  Pulse: 58  Temp: 98 F (36.7 C)  Resp: 18   General:  Well appearing. No respiratory difficulty HEENT: normal Neck: supple. no JVD. Carotids 2+ bilat; no bruits. No lymphadenopathy or thryomegaly appreciated. Cor: PMI nondisplaced. Regular rate & rhythm. No rubs, gallops or murmurs. Lungs: clear Abdomen: soft, nontender, nondistended. No hepatosplenomegaly. No bruits or masses. Good bowel  sounds. Extremities: no cyanosis, clubbing, rash, edema Neuro: alert & oriented x 3, cranial nerves grossly intact. moves all 4 extremities w/o difficulty. Affect pleasant.  ECG: SR with anterior TWI.   Results for orders placed or performed during the hospital encounter of 10/04/14 (from the past 24 hour(s))  CBC     Status: None   Collection Time: 10/04/14  5:55 PM  Result Value Ref Range   WBC 5.9 4.0 - 10.5 K/uL   RBC 4.32 3.87 - 5.11 MIL/uL   Hemoglobin 14.0 12.0 - 15.0 g/dL   HCT 41.3 36.0 - 46.0 %   MCV 95.6 78.0 - 100.0 fL   MCH 32.4 26.0 - 34.0 pg   MCHC 33.9 30.0 - 36.0 g/dL   RDW 13.5 11.5 - 15.5 %   Platelets 166 150 - 400 K/uL  Basic metabolic panel     Status: Abnormal   Collection Time: 10/04/14  5:55 PM  Result Value Ref Range   Sodium 139 135 - 145 mmol/L   Potassium 3.8 3.5 - 5.1 mmol/L   Chloride 105 101 - 111 mmol/L   CO2 21 (L) 22 - 32 mmol/L   Glucose, Bld 91 65 - 99 mg/dL   BUN 13 6 - 20 mg/dL   Creatinine, Ser 0.67 0.44 - 1.00 mg/dL   Calcium 9.1 8.9 - 10.3 mg/dL   GFR calc non Af Amer >60 >60 mL/min   GFR calc Af Amer >60 >60 mL/min   Anion gap 13 5 - 15  Troponin I     Status: None   Collection Time: 10/04/14  5:55 PM  Result Value Ref Range   Troponin I <0.03 <0.031 ng/mL   Dg Chest 2 View  10/04/2014   CLINICAL DATA:  Hypertension and syncope  EXAM: CHEST  2 VIEW  COMPARISON:  February 13, 2011  FINDINGS: There is no edema or consolidation. The heart size and pulmonary vascularity are normal. No adenopathy. No bone lesions.  IMPRESSION: No edema or consolidation.   Electronically Signed   By: Lowella Grip III M.D.   On: 10/04/2014 18:40     ASSESSMENT: 69 yo woman with h/o panic, RA and significant emotional stress recently presenting with dizziness/presyncope, left arm weakness and jaw discomfort.  Overall seems atypical but there is some concern that jaw discomfort and left arm weakness could be anginal equivalents especially with ECG  changes.  However, these potentially could have been related to elevated BP.  I think ischemia is less likely given undetectable troponin, however, symptoms lasted less than an hour and she sought medical attention quickly.  I am not sure about her presyncope as this appears to have occurred multiple times in the past and related to anxiety/panic per report.    PLAN/DISCUSSION: Observation NPO after MN ECG now Cycle troponins A1c, lipids and TSH to stratify May consider exercise stress test if above negative.

## 2014-10-06 LAB — HEMOGLOBIN A1C
HEMOGLOBIN A1C: 5.3 % (ref 4.8–5.6)
MEAN PLASMA GLUCOSE: 105 mg/dL

## 2014-10-12 ENCOUNTER — Ambulatory Visit (HOSPITAL_COMMUNITY): Payer: Medicare Other | Attending: Cardiovascular Disease

## 2014-10-12 ENCOUNTER — Other Ambulatory Visit: Payer: Self-pay

## 2014-10-12 DIAGNOSIS — I517 Cardiomegaly: Secondary | ICD-10-CM | POA: Diagnosis not present

## 2014-10-12 DIAGNOSIS — R55 Syncope and collapse: Secondary | ICD-10-CM | POA: Insufficient documentation

## 2014-10-31 ENCOUNTER — Encounter: Payer: Self-pay | Admitting: Physician Assistant

## 2014-10-31 ENCOUNTER — Ambulatory Visit (INDEPENDENT_AMBULATORY_CARE_PROVIDER_SITE_OTHER): Payer: Medicare Other | Admitting: Physician Assistant

## 2014-10-31 VITALS — BP 130/70 | HR 60 | Ht 68.0 in | Wt 146.7 lb

## 2014-10-31 DIAGNOSIS — R42 Dizziness and giddiness: Secondary | ICD-10-CM | POA: Diagnosis not present

## 2014-10-31 DIAGNOSIS — F1010 Alcohol abuse, uncomplicated: Secondary | ICD-10-CM | POA: Diagnosis not present

## 2014-10-31 NOTE — Patient Instructions (Signed)
Your physician recommends that you schedule a follow-up appointment in: AS NEEDED  

## 2014-10-31 NOTE — Progress Notes (Signed)
Patient ID: [name] [name], female   DOB: December 30, 1945, 69 y.o.   MRN: [id number]    Date:  10/31/2014   ID:  [name] [name], DOB [birth year], MRN [id number]  PCP:  Pcp Not In System  Primary Cardiologist:  Johnsie Cancel   Chief complaint: Post hospital follow-up   History of Present Illness: [name] [name] is a 69 y.o. female without significant PMH except for panic attacks and RA. She also notes she has been under a great deal of emotional stress lately. She lost her daughter who died at age 22 and subsequently she has increased her alcohol consumption. No personal history of HTN, DM or tobacco use. No family h/o CAD.  She initially presented to [facility] on 10/04/14 with complaints of dizziness, near syncope, left jaw discomfort and left arm weakness. SBP was elevated in the 170s-190s. She was then transferred to [facility]. On arrival to the ED, her jaw pain and arm weakness had resolved. She was given NTG in the ED and her BP improved. She was admitted to telemetry. Cardiac enzymes were cycled and negative x 3. Her EKG showed slight anterolateral Twave changes, however she underwent an exercise NST w/o exertional symptoms and images showed no ischemia. EF was estimated at 80%. She had no further issues with HTN. She required no additional medications. SBP remained stable in the 120s-130s. She denied any recurrent dizziness, near syncope, jaw pain or upper arm weakness.    The patient presents for posthospital follow-up  Interval review her echocardiogram.  She had an outpatient echocardiogram which revealed ejection fraction of 55-60% normal wall motion. Diastolic parameters were normal.no valvular abnormalities.    She has no specific complaints..   She reports she continues to drink excessively.  The patient currently denies nausea, vomiting, fever, chest pain, shortness of breath, orthopnea, dizziness, PND, cough, congestion, abdominal pain, hematochezia, melena, lower extremity edema,  claudication.  Wt Readings from Last 3 Encounters:  10/31/14 146 lb 11.2 oz (66.543 kg)  10/04/14 145 lb 14.4 oz (66.18 kg)  12/03/12 148 lb (67.132 kg)     Past Medical History  Diagnosis Date  . Arthritis   . Anxiety disorder     Current Outpatient Prescriptions  Medication Sig Dispense Refill  . Ascorbic Acid (VITAMIN C PO) Take 1 tablet by mouth daily.      . Calcium Carb-Cholecalciferol (CALCIUM + D3 PO) Take 1 capsule by mouth 2 (two) times daily.    . cholecalciferol (VITAMIN D) 1000 UNITS tablet Take 1,000 Units by mouth daily.      . citalopram (CELEXA) 20 MG tablet Take 20 mg by mouth daily.  0  . hydroxychloroquine (PLAQUENIL) 200 MG tablet Take by mouth daily.       No current facility-administered medications for this visit.    Allergies:   No Known Allergies  Social History:  The patient  reports that she has never smoked. She has never used smokeless tobacco. She reports that she drinks alcohol. She reports that she does not use illicit drugs.   Family history:  No family history on file.  ROS:  Please see the history of present illness.  All other systems reviewed and negative.   PHYSICAL EXAM: VS:  BP 130/70 mmHg  Pulse 60  Ht 5\' 8"  (1.727 m)  Wt 146 lb 11.2 oz (66.543 kg)  BMI 22.31 kg/m2 Well nourished, well developed, in no acute distress HEENT: Pupils are equal round react to light accommodation extraocular movements are intact.  Neck:  no JVDNo cervical lymphadenopathy. Cardiac: Regular rate and rhythm without murmurs rubs or gallops. Lungs:  clear to auscultation bilaterally, no wheezing, rhonchi or rales Ext: no lower extremity edema.  2+ radial and dorsalis pedis pulses. Skin: warm and dry Neuro:  Grossly normal  Lipid Panel     Component Value Date/Time   CHOL 209* 10/05/2014 0217   TRIG 42 10/05/2014 0217   HDL 76 10/05/2014 0217   CHOLHDL 2.8 10/05/2014 0217   VLDL 8 10/05/2014 0217   LDLCALC 125* 10/05/2014 0217        ASSESSMENT  AND PLAN:  Problem List Items Addressed This Visit    Dizziness   Alcohol abuse - Primary      Dizziness: No further complaints.  Echo was normal.  The patient has been under a great deal of emotional stress after the loss of her daughter.  As a result, she is drinking ETOH very heavily.  We discussed cutting back and trying to find alternative means for grieving.  No further cardiac follow up needed at this time.

## 2014-11-28 NOTE — H&P (Signed)
[name] [name] is an 69 y.o. female with symptomatic pelvic relaxation. Cystocele sometimes protrudes at vaginal introitus. U/S shows uterus 6.4 x 2.8 x 4.0 cm, ovaries normal.  Pertinent Gynecological History: Menses: post-menopausal Bleeding: N/A Contraception: none DES exposure: denies Blood transfusions: none Sexually transmitted diseases: no past history Previous GYN Procedures: N/A  Last mammogram: normal Date: 2016 Last pap: normal Date: 2014 OB History: G7, P4   Menstrual History: Menarche age: [age]  No LMP recorded. Patient is postmenopausal.    Past Medical History  Diagnosis Date  . Arthritis   . Anxiety disorder     No past surgical history on file.  No family history on file.  Social History:  reports that she has never smoked. She has never used smokeless tobacco. She reports that she drinks alcohol. She reports that she does not use illicit drugs.  Allergies: No Known Allergies  No prescriptions prior to admission    Review of Systems  Constitutional: Negative for fever.    There were no vitals taken for this visit. Physical Exam  Cardiovascular: Normal rate and regular rhythm.   Respiratory: Effort normal and breath sounds normal.  GI: Soft. There is no tenderness.  Genitourinary:  Uterus very mobile Cystocele at vaginal introitus  Neurological: She has normal reflexes.    No results found for this or any previous visit (from the past 24 hour(s)).  No results found.  Assessment/Plan: 69 yo G7P4 with symptomatic pelvic relaxation D/W patient LAVH/BSO/A&P repair/SSLS. Potential complications discussed including infection, organ damage, bleeding/transfusion-HIV/Hep, DVT/PE, pneumonia, fistula, laparotomy, pelvic pain, abdominal pain, painful intercourse, return to OR. Recurrence of prolapse also reviewed. All questions answered.  Hansen Carino II,[name] [name] E 11/28/2014, 6:45 PM

## 2014-11-28 NOTE — Patient Instructions (Addendum)
Your procedure is scheduled on:  THURSDAY, December 13, 2014  Enter through the Main Entrance of [facility] at:  6:00 A.M.  Pick up the phone at the desk and dial [phone].  Call this number if you have problems the morning of surgery: [phone].  Remember: Do NOT eat food or drink after: Midnight Wednesday, December 12, 2014 Take these medicines the morning of surgery with a SIP OF WATER:  Celexa, Plaquenil  Do NOT wear jewelry (body piercing), metal hair clips/bobby pins, make-up, or nail polish. Do NOT wear lotions, powders, or perfumes.  You may wear deoderant. Do NOT shave for 48 hours prior to surgery. Do NOT bring valuables to the hospital. Contacts, dentures, or bridgework may not be worn into surgery. Have a responsible adult drive you home and stay with you for 24 hours after your procedure

## 2014-11-29 ENCOUNTER — Encounter (HOSPITAL_COMMUNITY): Payer: Self-pay

## 2014-11-29 ENCOUNTER — Encounter (HOSPITAL_COMMUNITY)
Admission: RE | Admit: 2014-11-29 | Discharge: 2014-11-29 | Disposition: A | Discharge status: Home / Self Care | Payer: Medicare Other | Source: Ambulatory Visit | Attending: Obstetrics and Gynecology | Admitting: Obstetrics and Gynecology

## 2014-11-29 DIAGNOSIS — Z01818 Encounter for other preprocedural examination: Secondary | ICD-10-CM | POA: Insufficient documentation

## 2014-11-29 DIAGNOSIS — N819 Female genital prolapse, unspecified: Secondary | ICD-10-CM | POA: Diagnosis not present

## 2014-11-29 HISTORY — DX: Major depressive disorder, single episode, unspecified: F32.9

## 2014-11-29 HISTORY — DX: Rheumatoid arthritis, unspecified: M06.9

## 2014-11-29 HISTORY — DX: Depression, unspecified: F32.A

## 2014-11-29 LAB — CBC
HEMATOCRIT: 43 % (ref 36.0–46.0)
Hemoglobin: 14.5 g/dL (ref 12.0–15.0)
MCH: 32.3 pg (ref 26.0–34.0)
MCHC: 33.7 g/dL (ref 30.0–36.0)
MCV: 95.8 fL (ref 78.0–100.0)
PLATELETS: 197 10*3/uL (ref 150–400)
RBC: 4.49 MIL/uL (ref 3.87–5.11)
RDW: 13.4 % (ref 11.5–15.5)
WBC: 6.1 10*3/uL (ref 4.0–10.5)

## 2014-11-29 LAB — COMPREHENSIVE METABOLIC PANEL
ALK PHOS: 101 U/L (ref 38–126)
ALT: 34 U/L (ref 14–54)
ANION GAP: 7 (ref 5–15)
AST: 36 U/L (ref 15–41)
Albumin: 4.4 g/dL (ref 3.5–5.0)
BUN: 14 mg/dL (ref 6–20)
CALCIUM: 9.3 mg/dL (ref 8.9–10.3)
CO2: 26 mmol/L (ref 22–32)
Chloride: 104 mmol/L (ref 101–111)
Creatinine, Ser: 0.61 mg/dL (ref 0.44–1.00)
GFR calc non Af Amer: >60 mL/min (ref >60)
Glucose, Bld: 93 mg/dL (ref 65–99)
POTASSIUM: 4.2 mmol/L (ref 3.5–5.1)
SODIUM: 137 mmol/L (ref 135–145)
Total Bilirubin: 0.7 mg/dL (ref 0.3–1.2)
Total Protein: 7.6 g/dL (ref 6.5–8.1)

## 2014-11-29 LAB — ABO/RH: ABO/RH(D): O POS

## 2014-11-29 LAB — TYPE AND SCREEN
ABO/RH(D): O POS
ANTIBODY SCREEN: NEGATIVE

## 2014-12-13 ENCOUNTER — Ambulatory Visit (HOSPITAL_COMMUNITY): Payer: Medicare Other | Admitting: Anesthesiology

## 2014-12-13 ENCOUNTER — Encounter (HOSPITAL_COMMUNITY): Payer: Self-pay | Admitting: *Deleted

## 2014-12-13 ENCOUNTER — Observation Stay (HOSPITAL_COMMUNITY)
Admission: RE | Admit: 2014-12-13 | Discharge: 2014-12-14 | Disposition: A | Discharge status: Home / Self Care | Payer: Medicare Other | Source: Ambulatory Visit | Attending: Obstetrics and Gynecology | Admitting: Obstetrics and Gynecology

## 2014-12-13 ENCOUNTER — Encounter (HOSPITAL_COMMUNITY)
Admission: RE | Disposition: A | Discharge status: Home / Self Care | Payer: Self-pay | Source: Ambulatory Visit | Attending: Obstetrics and Gynecology

## 2014-12-13 DIAGNOSIS — N8110 Cystocele, unspecified: Secondary | ICD-10-CM | POA: Diagnosis not present

## 2014-12-13 DIAGNOSIS — N8189 Other female genital prolapse: Principal | ICD-10-CM | POA: Insufficient documentation

## 2014-12-13 DIAGNOSIS — N819 Female genital prolapse, unspecified: Secondary | ICD-10-CM | POA: Diagnosis present

## 2014-12-13 DIAGNOSIS — M1990 Unspecified osteoarthritis, unspecified site: Secondary | ICD-10-CM | POA: Insufficient documentation

## 2014-12-13 DIAGNOSIS — F419 Anxiety disorder, unspecified: Secondary | ICD-10-CM | POA: Diagnosis not present

## 2014-12-13 DIAGNOSIS — N858 Other specified noninflammatory disorders of uterus: Secondary | ICD-10-CM | POA: Insufficient documentation

## 2014-12-13 HISTORY — PX: ANTERIOR AND POSTERIOR REPAIR WITH SACROSPINOUS FIXATION: SHX6536

## 2014-12-13 SURGERY — HYSTERECTOMY, VAGINAL, LAPAROSCOPY-ASSISTED, WITH SALPINGO-OOPHORECTOMY
Anesthesia: General | Site: Vagina

## 2014-12-13 MED ORDER — LACTATED RINGERS IV SOLN
INTRAVENOUS | Status: DC
  Administered 2014-12-13 (×3): via INTRAVENOUS

## 2014-12-13 MED ORDER — CITALOPRAM HYDROBROMIDE 20 MG PO TABS
20.0000 mg | ORAL_TABLET | Freq: Every day | ORAL | Status: DC
  Administered 2014-12-13 – 2014-12-14 (×2): 20 mg via ORAL
  Filled 2014-12-13 (×3): qty 1

## 2014-12-13 MED ORDER — DIPHENHYDRAMINE HCL 12.5 MG/5ML PO ELIX
12.5000 mg | ORAL_SOLUTION | Freq: Four times a day (QID) | ORAL | Status: DC | PRN
  Administered 2014-12-13: 12.5 mg via ORAL
  Filled 2014-12-13 (×2): qty 5

## 2014-12-13 MED ORDER — HYDROMORPHONE 1 MG/ML IV SOLN
INTRAVENOUS | Status: DC
  Administered 2014-12-13: 0.4 mg via INTRAVENOUS
  Administered 2014-12-13: 2.6 mg via INTRAVENOUS
  Administered 2014-12-13: 12:00:00 via INTRAVENOUS
  Filled 2014-12-13: qty 25

## 2014-12-13 MED ORDER — SENNA 8.6 MG PO TABS
1.0000 | ORAL_TABLET | Freq: Two times a day (BID) | ORAL | Status: DC
  Administered 2014-12-13 – 2014-12-14 (×3): 8.6 mg via ORAL
  Filled 2014-12-13 (×5): qty 1

## 2014-12-13 MED ORDER — HYDROMORPHONE HCL 1 MG/ML IJ SOLN
0.2500 mg | INTRAMUSCULAR | Status: DC | PRN
  Administered 2014-12-13 (×2): 0.5 mg via INTRAVENOUS

## 2014-12-13 MED ORDER — NEOSTIGMINE METHYLSULFATE 10 MG/10ML IV SOLN
INTRAVENOUS | Status: AC
  Filled 2014-12-13: qty 1

## 2014-12-13 MED ORDER — IBUPROFEN 600 MG PO TABS
600.0000 mg | ORAL_TABLET | Freq: Four times a day (QID) | ORAL | Status: DC | PRN
  Administered 2014-12-14: 600 mg via ORAL
  Filled 2014-12-13: qty 1

## 2014-12-13 MED ORDER — ESTRADIOL 0.1 MG/GM VA CREA
TOPICAL_CREAM | VAGINAL | Status: AC
  Filled 2014-12-13: qty 42.5

## 2014-12-13 MED ORDER — SODIUM CHLORIDE 0.9 % IJ SOLN
INTRAMUSCULAR | Status: DC | PRN
  Administered 2014-12-13: 18 mL

## 2014-12-13 MED ORDER — MENTHOL 3 MG MT LOZG
1.0000 | LOZENGE | OROMUCOSAL | Status: DC | PRN
  Administered 2014-12-13: 3 mg via ORAL
  Filled 2014-12-13: qty 9

## 2014-12-13 MED ORDER — FENTANYL CITRATE (PF) 100 MCG/2ML IJ SOLN
INTRAMUSCULAR | Status: AC
  Filled 2014-12-13: qty 4

## 2014-12-13 MED ORDER — LIDOCAINE-EPINEPHRINE 0.5 %-1:200000 IJ SOLN
INTRAMUSCULAR | Status: DC | PRN
  Administered 2014-12-13: 18 mL

## 2014-12-13 MED ORDER — ONDANSETRON HCL 4 MG/2ML IJ SOLN
INTRAMUSCULAR | Status: DC | PRN
  Administered 2014-12-13: 4 mg via INTRAVENOUS

## 2014-12-13 MED ORDER — DIPHENHYDRAMINE HCL 50 MG/ML IJ SOLN
INTRAMUSCULAR | Status: AC
  Filled 2014-12-13: qty 1

## 2014-12-13 MED ORDER — PROMETHAZINE HCL 25 MG/ML IJ SOLN: 6.2500 mg | INTRAMUSCULAR | Status: DC | PRN

## 2014-12-13 MED ORDER — FENTANYL CITRATE (PF) 100 MCG/2ML IJ SOLN
INTRAMUSCULAR | Status: DC | PRN
  Administered 2014-12-13 (×4): 50 ug via INTRAVENOUS
  Administered 2014-12-13: 25 ug via INTRAVENOUS
  Administered 2014-12-13: 50 ug via INTRAVENOUS

## 2014-12-13 MED ORDER — PROPOFOL 10 MG/ML IV BOLUS
INTRAVENOUS | Status: AC
  Filled 2014-12-13: qty 20

## 2014-12-13 MED ORDER — GLYCOPYRROLATE 0.2 MG/ML IJ SOLN
INTRAMUSCULAR | Status: DC | PRN
  Administered 2014-12-13: 0.6 mg via INTRAVENOUS

## 2014-12-13 MED ORDER — ONDANSETRON HCL 4 MG PO TABS: 4.0000 mg | ORAL_TABLET | Freq: Four times a day (QID) | ORAL | Status: DC | PRN

## 2014-12-13 MED ORDER — LIDOCAINE HCL (CARDIAC) 20 MG/ML IV SOLN
INTRAVENOUS | Status: DC | PRN
  Administered 2014-12-13: 40 mg via INTRAVENOUS
  Administered 2014-12-13: 20 mg via INTRAVENOUS

## 2014-12-13 MED ORDER — BUPIVACAINE HCL (PF) 0.5 % IJ SOLN
INTRAMUSCULAR | Status: AC
  Filled 2014-12-13: qty 30

## 2014-12-13 MED ORDER — NALOXONE HCL 0.4 MG/ML IJ SOLN: 0.4000 mg | INTRAMUSCULAR | Status: DC | PRN

## 2014-12-13 MED ORDER — HYDROMORPHONE HCL 1 MG/ML IJ SOLN
INTRAMUSCULAR | Status: AC
  Filled 2014-12-13: qty 1

## 2014-12-13 MED ORDER — SODIUM CHLORIDE 0.9 % IJ SOLN: 9.0000 mL | INTRAMUSCULAR | Status: DC | PRN

## 2014-12-13 MED ORDER — MIDAZOLAM HCL 2 MG/2ML IJ SOLN
INTRAMUSCULAR | Status: AC
  Filled 2014-12-13: qty 4

## 2014-12-13 MED ORDER — FENTANYL CITRATE (PF) 250 MCG/5ML IJ SOLN
INTRAMUSCULAR | Status: AC
  Filled 2014-12-13: qty 25

## 2014-12-13 MED ORDER — DEXAMETHASONE SODIUM PHOSPHATE 4 MG/ML IJ SOLN
INTRAMUSCULAR | Status: AC
  Filled 2014-12-13: qty 1

## 2014-12-13 MED ORDER — SODIUM CHLORIDE 0.9 % IJ SOLN
INTRAMUSCULAR | Status: AC
  Filled 2014-12-13: qty 100

## 2014-12-13 MED ORDER — LACTATED RINGERS IV SOLN
INTRAVENOUS | Status: DC
  Administered 2014-12-13 – 2014-12-14 (×2): via INTRAVENOUS

## 2014-12-13 MED ORDER — ONDANSETRON HCL 4 MG/2ML IJ SOLN: 4.0000 mg | Freq: Four times a day (QID) | INTRAMUSCULAR | Status: DC | PRN

## 2014-12-13 MED ORDER — HYDROXYCHLOROQUINE SULFATE 200 MG PO TABS
200.0000 mg | ORAL_TABLET | Freq: Every day | ORAL | Status: DC
  Administered 2014-12-13 – 2014-12-14 (×2): 200 mg via ORAL
  Filled 2014-12-13 (×3): qty 1

## 2014-12-13 MED ORDER — HYDROMORPHONE 1 MG/ML IV SOLN
INTRAVENOUS | Status: DC
  Administered 2014-12-13: 1 mg via INTRAVENOUS
  Administered 2014-12-14: 0.8 mg via INTRAVENOUS

## 2014-12-13 MED ORDER — NEOSTIGMINE METHYLSULFATE 10 MG/10ML IV SOLN
INTRAVENOUS | Status: DC | PRN
  Administered 2014-12-13: 4 mg via INTRAVENOUS

## 2014-12-13 MED ORDER — KETOROLAC TROMETHAMINE 30 MG/ML IJ SOLN: 30.0000 mg | Freq: Once | INTRAMUSCULAR | Status: DC | PRN

## 2014-12-13 MED ORDER — SCOPOLAMINE 1 MG/3DAYS TD PT72: 1.0000 | MEDICATED_PATCH | Freq: Once | TRANSDERMAL | Status: DC

## 2014-12-13 MED ORDER — OXYCODONE-ACETAMINOPHEN 5-325 MG PO TABS
1.0000 | ORAL_TABLET | ORAL | Status: DC | PRN
  Administered 2014-12-14 (×2): 1 via ORAL
  Administered 2014-12-14: 2 via ORAL
  Filled 2014-12-13: qty 1
  Filled 2014-12-13: qty 2
  Filled 2014-12-13: qty 1

## 2014-12-13 MED ORDER — ROCURONIUM BROMIDE 100 MG/10ML IV SOLN
INTRAVENOUS | Status: DC | PRN
  Administered 2014-12-13: 35 mg via INTRAVENOUS
  Administered 2014-12-13: 5 mg via INTRAVENOUS

## 2014-12-13 MED ORDER — MEPERIDINE HCL 25 MG/ML IJ SOLN: 6.2500 mg | INTRAMUSCULAR | Status: DC | PRN

## 2014-12-13 MED ORDER — DIPHENHYDRAMINE HCL 25 MG PO CAPS
25.0000 mg | ORAL_CAPSULE | Freq: Four times a day (QID) | ORAL | Status: DC | PRN
  Administered 2014-12-13 – 2014-12-14 (×3): 25 mg via ORAL
  Filled 2014-12-13 (×3): qty 1

## 2014-12-13 MED ORDER — PROPOFOL 10 MG/ML IV BOLUS
INTRAVENOUS | Status: DC | PRN
  Administered 2014-12-13: 10 mg via INTRAVENOUS
  Administered 2014-12-13: 170 mg via INTRAVENOUS

## 2014-12-13 MED ORDER — ONDANSETRON HCL 4 MG/2ML IJ SOLN
INTRAMUSCULAR | Status: AC
  Filled 2014-12-13: qty 2

## 2014-12-13 MED ORDER — LIDOCAINE HCL (CARDIAC) 20 MG/ML IV SOLN
INTRAVENOUS | Status: AC
  Filled 2014-12-13: qty 5

## 2014-12-13 MED ORDER — DIPHENHYDRAMINE HCL 50 MG/ML IJ SOLN: 12.5000 mg | Freq: Four times a day (QID) | INTRAMUSCULAR | Status: DC | PRN

## 2014-12-13 MED ORDER — EPHEDRINE SULFATE 50 MG/ML IJ SOLN
INTRAMUSCULAR | Status: DC | PRN
  Administered 2014-12-13: 5 mg via INTRAVENOUS
  Administered 2014-12-13: 10 mg via INTRAVENOUS
  Administered 2014-12-13 (×2): 5 mg via INTRAVENOUS

## 2014-12-13 MED ORDER — EPHEDRINE 5 MG/ML INJ
INTRAVENOUS | Status: AC
  Filled 2014-12-13: qty 10

## 2014-12-13 MED ORDER — ALUM & MAG HYDROXIDE-SIMETH 200-200-20 MG/5ML PO SUSP: 30.0000 mL | ORAL | Status: DC | PRN

## 2014-12-13 MED ORDER — DEXAMETHASONE SODIUM PHOSPHATE 10 MG/ML IJ SOLN
INTRAMUSCULAR | Status: DC | PRN
Start: 2014-12-13 — End: 2014-12-13
  Administered 2014-12-13: 4 mg via INTRAVENOUS

## 2014-12-13 MED ORDER — SODIUM CHLORIDE 0.9 % IJ SOLN
INTRAMUSCULAR | Status: AC
  Filled 2014-12-13: qty 10

## 2014-12-13 MED ORDER — SCOPOLAMINE 1 MG/3DAYS TD PT72
MEDICATED_PATCH | TRANSDERMAL | Status: AC
  Filled 2014-12-13: qty 1

## 2014-12-13 MED ORDER — ROCURONIUM BROMIDE 100 MG/10ML IV SOLN
INTRAVENOUS | Status: AC
  Filled 2014-12-13: qty 1

## 2014-12-13 MED ORDER — DIPHENHYDRAMINE HCL 50 MG/ML IJ SOLN
INTRAMUSCULAR | Status: DC | PRN
  Administered 2014-12-13 (×2): 12.5 mg via INTRAVENOUS

## 2014-12-13 MED ORDER — ZOLPIDEM TARTRATE 5 MG PO TABS: 5.0000 mg | ORAL_TABLET | Freq: Every evening | ORAL | Status: DC | PRN

## 2014-12-13 MED ORDER — GLYCOPYRROLATE 0.2 MG/ML IJ SOLN
INTRAMUSCULAR | Status: AC
  Filled 2014-12-13: qty 3

## 2014-12-13 MED ORDER — KETOROLAC TROMETHAMINE 30 MG/ML IJ SOLN
INTRAMUSCULAR | Status: AC
  Filled 2014-12-13: qty 1

## 2014-12-13 MED ORDER — LIDOCAINE-EPINEPHRINE 0.5 %-1:200000 IJ SOLN
INTRAMUSCULAR | Status: AC
  Filled 2014-12-13: qty 1

## 2014-12-13 MED ORDER — KETOROLAC TROMETHAMINE 30 MG/ML IJ SOLN
INTRAMUSCULAR | Status: DC | PRN
  Administered 2014-12-13: 15 mg via INTRAVENOUS

## 2014-12-13 MED ORDER — ESTRADIOL 0.1 MG/GM VA CREA
TOPICAL_CREAM | VAGINAL | Status: DC | PRN
  Administered 2014-12-13: 1 via VAGINAL

## 2014-12-13 MED ORDER — CEFAZOLIN SODIUM-DEXTROSE 2-3 GM-% IV SOLR
INTRAVENOUS | Status: AC
Start: 2014-12-13 — End: 2014-12-13
  Filled 2014-12-13: qty 50

## 2014-12-13 MED ORDER — BUPIVACAINE HCL (PF) 0.5 % IJ SOLN
INTRAMUSCULAR | Status: DC | PRN
  Administered 2014-12-13: 6 mL
  Administered 2014-12-13: 24 mL

## 2014-12-13 MED ORDER — CEFAZOLIN SODIUM-DEXTROSE 2-3 GM-% IV SOLR
2.0000 g | INTRAVENOUS | Status: AC
  Administered 2014-12-13: 2 g via INTRAVENOUS

## 2014-12-13 SURGICAL SUPPLY — 63 items
BLADE SURG 11 STRL SS (BLADE) ×4 IMPLANT
BLADE SURG 15 STRL LF C SS BP (BLADE) ×2 IMPLANT
BLADE SURG 15 STRL SS (BLADE) ×2
CABLE HIGH FREQUENCY MONO STRZ (ELECTRODE) IMPLANT
CATH ROBINSON RED A/P 16FR (CATHETERS) ×4 IMPLANT
CLOTH BEACON ORANGE TIMEOUT ST (SAFETY) ×4 IMPLANT
CONT PATH 16OZ SNAP LID 3702 (MISCELLANEOUS) ×4 IMPLANT
COVER BACK TABLE 60X90IN (DRAPES) ×4 IMPLANT
DECANTER SPIKE VIAL GLASS SM (MISCELLANEOUS) IMPLANT
DEVICE CAPIO SLIM SINGLE (INSTRUMENTS) ×4 IMPLANT
DISSECTOR SPONGE CHERRY (GAUZE/BANDAGES/DRESSINGS) IMPLANT
DRSG COVADERM PLUS 2X2 (GAUZE/BANDAGES/DRESSINGS) ×8 IMPLANT
DRSG OPSITE POSTOP 3X4 (GAUZE/BANDAGES/DRESSINGS) IMPLANT
DURAPREP 26ML APPLICATOR (WOUND CARE) ×4 IMPLANT
ELECT LIGASURE LONG (ELECTRODE) ×4 IMPLANT
ELECT REM PT RETURN 9FT ADLT (ELECTROSURGICAL)
ELECTRODE REM PT RTRN 9FT ADLT (ELECTROSURGICAL) IMPLANT
FILTER SMOKE EVAC LAPAROSHD (FILTER) ×4 IMPLANT
GAUZE PACKING 1 X5 YD ST (GAUZE/BANDAGES/DRESSINGS) ×4 IMPLANT
GAUZE SPONGE 4X4 16PLY XRAY LF (GAUZE/BANDAGES/DRESSINGS) IMPLANT
GLOVE BIO SURGEON STRL SZ8 (GLOVE) ×12 IMPLANT
GLOVE BIOGEL PI IND STRL 6.5 (GLOVE) ×2 IMPLANT
GLOVE BIOGEL PI IND STRL 8 (GLOVE) ×2 IMPLANT
GLOVE BIOGEL PI INDICATOR 6.5 (GLOVE) ×2
GLOVE BIOGEL PI INDICATOR 8 (GLOVE) ×2
GOWN STRL REUS W/ TWL XL LVL3 (GOWN DISPOSABLE) ×4 IMPLANT
GOWN STRL REUS W/TWL LRG LVL3 (GOWN DISPOSABLE) ×16 IMPLANT
GOWN STRL REUS W/TWL XL LVL3 (GOWN DISPOSABLE) ×4
LEGGING LITHOTOMY PAIR STRL (DRAPES) ×4 IMPLANT
LIQUID BAND (GAUZE/BANDAGES/DRESSINGS) ×4 IMPLANT
NEEDLE HYPO 22GX1.5 SAFETY (NEEDLE) IMPLANT
NEEDLE HYPO 25X5/8 SAFETYGLIDE (NEEDLE) ×4 IMPLANT
NEEDLE INSUFFLATION 120MM (ENDOMECHANICALS) ×4 IMPLANT
NEEDLE MAYO CATGUT SZ4 (NEEDLE) ×4 IMPLANT
NS IRRIG 1000ML POUR BTL (IV SOLUTION) ×8 IMPLANT
PACK LAVH (CUSTOM PROCEDURE TRAY) ×4 IMPLANT
PACK ROBOTIC GOWN (GOWN DISPOSABLE) ×8 IMPLANT
PACK VAGINAL WOMENS (CUSTOM PROCEDURE TRAY) IMPLANT
PAD POSITIONING PINK XL (MISCELLANEOUS) ×4 IMPLANT
PLUG CATH AND CAP STER (CATHETERS) IMPLANT
SCISSORS LAP 5X45 EPIX DISP (ENDOMECHANICALS) IMPLANT
SEALER TISSUE G2 CVD JAW 45CM (ENDOMECHANICALS) ×4 IMPLANT
SET CYSTO W/LG BORE CLAMP LF (SET/KITS/TRAYS/PACK) IMPLANT
SET IRRIG TUBING LAPAROSCOPIC (IRRIGATION / IRRIGATOR) ×4 IMPLANT
SUT CAPIO POLYGLYCOLIC (SUTURE) ×4 IMPLANT
SUT MNCRL 0 MO-4 VIOLET 18 CR (SUTURE) ×4 IMPLANT
SUT MNCRL 0 VIOLET 6X18 (SUTURE) ×2 IMPLANT
SUT MON AB 2-0 CT1 27 (SUTURE) ×4 IMPLANT
SUT MONOCRYL 0 6X18 (SUTURE) ×2
SUT MONOCRYL 0 MO 4 18  CR/8 (SUTURE) ×4
SUT PLAIN 2 0 (SUTURE)
SUT PLAIN ABS 2-0 54XMFL TIE (SUTURE) IMPLANT
SUT VIC AB 2-0 CT2 27 (SUTURE) ×8 IMPLANT
SUT VIC AB 2-0 UR6 27 (SUTURE) ×8 IMPLANT
SUT VIC AB 4-0 PS2 27 (SUTURE) ×8 IMPLANT
SUT VICRYL 0 UR6 27IN ABS (SUTURE) ×8 IMPLANT
SYR 30ML LL (SYRINGE) ×4 IMPLANT
TOWEL OR 17X24 6PK STRL BLUE (TOWEL DISPOSABLE) ×8 IMPLANT
TRAY FOLEY CATH SILVER 14FR (SET/KITS/TRAYS/PACK) ×4 IMPLANT
TROCAR XCEL NON-BLD 11X100MML (ENDOMECHANICALS) ×4 IMPLANT
TROCAR XCEL NON-BLD 5MMX100MML (ENDOMECHANICALS) ×4 IMPLANT
WARMER LAPAROSCOPE (MISCELLANEOUS) ×4 IMPLANT
WATER STERILE IRR 1000ML POUR (IV SOLUTION) IMPLANT

## 2014-12-13 NOTE — Anesthesia Preprocedure Evaluation (Signed)
Anesthesia Evaluation  Patient identified by MRN, date of birth, ID band Patient awake    Reviewed: Allergy & Precautions, H&P , NPO status , Patient's Chart, lab work & pertinent test results  Airway Mallampati: I  TM Distance: >3 FB Neck ROM: full    Dental no notable dental hx. (+) Teeth Intact   Pulmonary neg pulmonary ROS,    Pulmonary exam normal        Cardiovascular negative cardio ROS Normal cardiovascular exam     Neuro/Psych negative neurological ROS     GI/Hepatic negative GI ROS, Neg liver ROS,   Endo/Other  negative endocrine ROS  Renal/GU negative Renal ROS     Musculoskeletal   Abdominal Normal abdominal exam  (+)   Peds  Hematology negative hematology ROS (+)   Anesthesia Other Findings   Reproductive/Obstetrics negative OB ROS                             Anesthesia Physical Anesthesia Plan  ASA: II  Anesthesia Plan: General   Post-op Pain Management:    Induction: Intravenous  Airway Management Planned: Oral ETT  Additional Equipment:   Intra-op Plan:   Post-operative Plan: Extubation in OR  Informed Consent: I have reviewed the patients History and Physical, chart, labs and discussed the procedure including the risks, benefits and alternatives for the proposed anesthesia with the patient or authorized representative who has indicated his/her understanding and acceptance.   Dental Advisory Given  Plan Discussed with: CRNA  Anesthesia Plan Comments:         Anesthesia Quick Evaluation

## 2014-12-13 NOTE — Brief Op Note (Signed)
12/13/2014  9:41 AM  PATIENT:  [name] [name]  69 y.o. female  PRE-OPERATIVE DIAGNOSIS:  prolapse  POST-OPERATIVE DIAGNOSIS:  prolapse  PROCEDURE:  Procedure(s): LAPAROSCOPIC ASSISTED VAGINAL HYSTERECTOMY WITH SALPINGO OOPHORECTOMY (Bilateral) ANTERIOR AND POSTERIOR REPAIR WITH SACROSPINOUS LIGAMENT SUSPENSION (N/A)  SURGEON:  Surgeon(s) and Role:    * Everlene Farrier, MD - Primary    * Linda Hedges, DO - Assisting  PHYSICIAN ASSISTANT:   ASSISTANTS: Morris   ANESTHESIA:   general  EBL:  Total I/O In: 1100 [I.V.:1100] Out: 100 [Urine:50; Blood:50]  BLOOD ADMINISTERED:none  DRAINS: Urinary Catheter (Foley)   LOCAL MEDICATIONS USED:  MARCAINE    and LIDOCAINE   SPECIMEN:  Source of Specimen:  uterus, bilateral tubes and ovaries  DISPOSITION OF SPECIMEN:  PATHOLOGY  COUNTS:  YES  TOURNIQUET:  * No tourniquets in log *  DICTATION: .Other Dictation: Dictation Number [id number]  PLAN OF CARE: Admit for overnight observation  PATIENT DISPOSITION:  PACU - hemodynamically stable.   Delay start of Pharmacological VTE agent (>24hrs) due to surgical blood loss or risk of bleeding: not applicable

## 2014-12-13 NOTE — Transfer of Care (Signed)
Immediate Anesthesia Transfer of Care Note  Patient: [name] [name]  Procedure(s) Performed: Procedure(s): LAPAROSCOPIC ASSISTED VAGINAL HYSTERECTOMY WITH SALPINGO OOPHORECTOMY (Bilateral) ANTERIOR AND POSTERIOR REPAIR WITH SACROSPINOUS LIGAMENT SUSPENSION (N/A)  Patient Location: PACU  Anesthesia Type:General  Level of Consciousness: awake, alert , oriented and patient cooperative  Airway & Oxygen Therapy: Patient Spontanous Breathing and Patient connected to nasal cannula oxygen  Post-op Assessment: Report given to RN and Post -op Vital signs reviewed and stable  Post vital signs: Reviewed and stable  Last Vitals:  Filed Vitals:   12/13/14 0607  BP: 856/31    Complications: No apparent anesthesia complications

## 2014-12-13 NOTE — Op Note (Signed)
NAMEMarland [name]  [name], [name]:  [id number]  MEDICAL RECORD NO.:  [id number]  LOCATION:  9304                          FACILITY:  [facility]  PHYSICIAN:  Daleen Bo. Gaetano Net, M.D. DATE OF BIRTH:  02/06/1946  DATE OF PROCEDURE:  12/13/2014 DATE OF DISCHARGE:                              OPERATIVE REPORT   PREOPERATIVE DIAGNOSIS:  Pelvic prolapse.  POSTOPERATIVE DIAGNOSIS:  Pelvic prolapse.  PROCEDURE:  Laparoscopically-assisted vaginal hysterectomy, bilateral salpingo-oophorectomy, anterior-posterior vaginal repair, sacrospinous ligament suspension.  SURGEON:  Daleen Bo. Gaetano Net, M.D.  ANESTHESIA:  General with endotracheal intubation.  ESTIMATED BLOOD LOSS:  50 mL.  SPECIMENS:  Uterus, bilateral fallopian tubes and ovaries to Pathology.  INDICATIONS AND CONSENT:  This patient is a 69 year old patient with symptomatic pelvic prolapse.  Details are dictated in the history and physical.  Laparoscopically-assisted vaginal hysterectomy, bilateral salpingo-oophorectomy, anterior-posterior vaginal repair and sacrospinous ligament suspension have been discussed with the patient preoperatively.  Potential risks and complications are reviewed preoperatively including, but not limited to, infection, organ damage, bleeding requiring transfusion of blood products with HIV and hepatitis acquisition, DVT, PE, pneumonia, fistula formation, pelvic pain, abdominal pain, vulvar pain, return to the operating room, laparotomy and recurrent pelvic relaxation have all been reviewed.  All questions have been answered.  Consent was signed on the chart.  The patient states she understands.  FINDINGS:  Upper abdomen is grossly normal.  Uterus is smooth in contour.  Anterior and posterior cul-de-sacs were normal.  Tubes and ovaries were normal bilaterally.  DESCRIPTION OF PROCEDURE:  The patient was taken to the operating room, where she was identified, placed in dorsal supine position and  general anesthesia was induced via the endotracheal intubation.  She was placed in the dorsal lithotomy position.  Time-out was undertaken.  She was prepped vaginally and abdominally.  Bladder straight catheterized. Hulka tenaculum was placed, uterus as a manipulator and she was draped in a sterile fashion.  Small infraumbilical incision was made and a disposable Veress needle was placed on the first attempt without difficulty.  A good syringe and drop test were noted.  Two liters of gas were then insufflated under low pressure with good tympany in the right upper quadrant.  Veress needle was removed and a 10/11 Xcel bladeless disposable trocar sleeve was placed using direct visualization with the diagnostic laparoscope.  After placement, the operative scope was used. Small suprapubic incision was made in the midline and a 5-mm disposable trocar sleeve was placed under direct visualization without difficulty. The above findings were noted.  Then, using the EnSeal bipolar cautery cutting instrument, the right infundibulopelvic ligament was taken down. This was carried across under the ovary, across the proximal round ligament down to the level of vesicouterine peritoneum.  Similar procedure was carried out on the left side.  Vesicouterine peritoneum was taken down cephalad laterally.  Good hemostasis was noted. Suprapubic trocar sleeve was removed.  Instruments were removed and attention was turned to the vagina.  Posterior cul-de-sacs entered sharply with scissors and the cervix was circumscribed with unipolar cautery.  Mucosa was advanced sharply and bluntly.  Handheld LigaSure bipolar cautery was used and the uterosacral ligaments followed by the bladder pillars.  Cardinal ligaments  and uterine vessels were taken down bilaterally.  This allowed the fundus with tubes and ovaries to be delivered posteriorly and the remaining tissue was taken down anteriorly under direct visualization  without difficulty.  The specimen was then delivered and sent to Pathology.  All suture will be 0 Monocryl unless otherwise designated.  Both uterosacral ligaments were then plicated the vaginal cuff bilaterally and then plicated the midline with a third suture.  Posterior half of the cuff was closed with interrupted figure- of-eights.  The anterior vaginal mucosa was then injected with lidocaine and 1:100,000 epinephrine.  This has been diluted further in 100 mL of normal saline.  This solution was injected in the anterior vaginal mucosa.  The anterior vaginal mucosa was then taken down in the midline to a point about 2 cm below the urethral meatus.  The mucosa was then dissected bilaterally sharply and bluntly from the underlying bladder pillar.  This mobilizes the bladder well.  The cystocele was then reduced with progressive pursestring sutures.  This does a good job of reducing the cystocele.  Excess mucosa was trimmed.  The anterior vaginal mucosa was then closed in a running, locking fashion with a 2-0 Vicryl suture down to the level of the anterior vaginal cuff.  The remainder of the anterior vaginal cuff was then closed with interrupted 0 Monocryl suture.  Posterior vaginal mucosa was then injected in a similar fashion.  Small shallow wedge tissue was removed at the 6 o'clock position of the vaginal introitus.  Posterior vaginal mucosa was then taken down in the midline to a point of about 3 cm below the vaginal cuff.  This was then dissected bilaterally sharply and bluntly. This dissection was carried upwards until the sacrospinous ligament was identified and the ischial spine was carefully identified.  Then, using the Capio needle driver with Vicryl suture, it was placed through the sacrospinous ligament 1-2 fingerbreadths medial to the ischial spine. This was then held for later use.  The rectovaginal fascia was then reapproximated in the midline with interrupted 0 Monocryl  suture. Excess vaginal mucosa was trimmed and the upper one-third of the vaginal mucosa was closed in a running, locking fashion with Vicryl suture. This was then held and the sacrospinous ligament was then tied down in the place, which does a good job of elevating the superior vagina.  The remainder of the posterior vaginal mucosa was then closed with the 0 Vicryl suture in a running, locking fashion and was taken all the way down to close the skin over the perineal body as well.  Foley catheter was placed in the bladder and clear urine was noted.  Vagina was packed with plain pack with estrogen cream.  Attention was returned to the abdomen.  Pneumoperitoneum was reintroduced and the suprapubic trocar sleeve was reintroduced under direct visualization.  Irrigation was carried out.  Minor oozing at peritoneal edges was controlled with bipolar cautery.  Excellent hemostasis was noted.  Excess fluid was removed and the remaining approximately 24 mL of 0.5% plain Marcaine was instilled into the peritoneal cavity.  Suprapubic trocar sleeve was removed.  The pneumoperitoneum was reduced and the umbilical trocar sleeve was removed.  The umbilical incision was closed with a 0 Vicryl in the fascia under good visualization.  Skin on both incisions was closed with interrupted 2-0 Vicryl.  Glue was applied to both.  All counts were correct and the patient was awakened, taken to the recovery room in stable condition.  Daleen Bo Gaetano Net, M.D.     JET/MEDQ  D:  12/13/2014  T:  12/13/2014  Job:  [id number]

## 2014-12-13 NOTE — Progress Notes (Signed)
No changes to H&P per patient history Reviewed with patient procedure-LAVH/BSO/A&P repair/SSLS All questions answered Patient states she understands and agrees

## 2014-12-13 NOTE — Progress Notes (Signed)
Good pain relief, ambulating  Filed Vitals:   12/13/14 1709  BP:   Pulse:   Temp:   Resp: 13   VSS Afeb Lungs CTA Cor RRR Abd soft, BS+ Ext PAS on UO clear  A/P: stable

## 2014-12-13 NOTE — Anesthesia Postprocedure Evaluation (Signed)
Anesthesia Post Note  Patient: [name] [name]  Procedure(s) Performed: Procedure(s) (LRB): LAPAROSCOPIC ASSISTED VAGINAL HYSTERECTOMY WITH SALPINGO OOPHORECTOMY (Bilateral) ANTERIOR AND POSTERIOR REPAIR WITH SACROSPINOUS LIGAMENT SUSPENSION (N/A)  Anesthesia type: General  Patient location: PACU  Post pain: Pain level controlled  Post assessment: Post-op Vital signs reviewed  Last Vitals:  Filed Vitals:   12/13/14 1115  BP: 94/56  Pulse: 95  Temp: 36.7 C  Resp: 20    Post vital signs: Reviewed  Level of consciousness: sedated  Complications: No apparent anesthesia complications

## 2014-12-13 NOTE — Addendum Note (Signed)
Addendum  created 12/13/14 1336 by Ignacia Bayley, CRNA   Modules edited: Notes Section   Notes Section:  File: [id number]

## 2014-12-13 NOTE — Anesthesia Procedure Notes (Signed)
Procedure Name: Intubation Date/Time: 12/13/2014 7:39 AM Performed by: Tobin Chad Pre-anesthesia Checklist: Patient identified, Timeout performed, Emergency Drugs available, Suction available and Patient being monitored Patient Re-evaluated:Patient Re-evaluated prior to inductionOxygen Delivery Method: Circle system utilized and Simple face mask Preoxygenation: Pre-oxygenation with 100% oxygen Intubation Type: IV induction and Inhalational induction Ventilation: Mask ventilation without difficulty Laryngoscope Size: Mac and 3 Grade View: Grade III Tube type: Oral Tube size: 7.0 mm Number of attempts: 1 Airway Equipment and Method: Stylet Placement Confirmation: positive ETCO2 and breath sounds checked- equal and bilateral Secured at: 21 cm Tube secured with: Tape Dental Injury: Teeth and Oropharynx as per pre-operative assessment

## 2014-12-13 NOTE — Anesthesia Postprocedure Evaluation (Signed)
  Anesthesia Post-op Note  Patient: [name] [name]  Procedure(s) Performed: Procedure(s): LAPAROSCOPIC ASSISTED VAGINAL HYSTERECTOMY WITH SALPINGO OOPHORECTOMY (Bilateral) ANTERIOR AND POSTERIOR REPAIR WITH SACROSPINOUS LIGAMENT SUSPENSION (N/A)  Patient Location: [facility]  Anesthesia Type:General  Level of Consciousness: awake  Airway and Oxygen Therapy: Patient Spontanous Breathing  Post-op Pain: mild  Post-op Assessment: Patient's Cardiovascular Status Stable and Respiratory Function Stable     RLE Motor Response: Purposeful movement RLE Sensation: Full sensation      Post-op Vital Signs: stable  Last Vitals:  Filed Vitals:   12/13/14 1245  BP: 101/57  Pulse: 82  Temp: 36.8 C  Resp: 16    Complications: No apparent anesthesia complications

## 2014-12-14 ENCOUNTER — Encounter (HOSPITAL_COMMUNITY): Payer: Self-pay | Admitting: Obstetrics and Gynecology

## 2014-12-14 DIAGNOSIS — N8189 Other female genital prolapse: Secondary | ICD-10-CM | POA: Diagnosis not present

## 2014-12-14 LAB — CBC
HCT: 34.7 % — ABNORMAL LOW (ref 36.0–46.0)
HEMOGLOBIN: 11.6 g/dL — AB (ref 12.0–15.0)
MCH: 31.9 pg (ref 26.0–34.0)
MCHC: 33.4 g/dL (ref 30.0–36.0)
MCV: 95.3 fL (ref 78.0–100.0)
PLATELETS: 144 10*3/uL — AB (ref 150–400)
RBC: 3.64 MIL/uL — AB (ref 3.87–5.11)
RDW: 13.1 % (ref 11.5–15.5)
WBC: 10.5 10*3/uL (ref 4.0–10.5)

## 2014-12-14 MED ORDER — OXYCODONE-ACETAMINOPHEN 5-325 MG PO TABS: 1.0000 | ORAL_TABLET | Freq: Four times a day (QID) | ORAL | Status: DC | PRN

## 2014-12-14 MED ORDER — IBUPROFEN 600 MG PO TABS: 600.0000 mg | ORAL_TABLET | Freq: Four times a day (QID) | ORAL | Status: DC | PRN

## 2014-12-14 NOTE — Progress Notes (Signed)
Vaginal packing removed, small amount dark vaginal drainage noted on packing, patient tolerated well.

## 2014-12-14 NOTE — Progress Notes (Signed)
Voiding, ambulating, good pain control, +flatus  VSS Afeb Abdomen soft Results for orders placed or performed during the hospital encounter of 12/13/14 (from the past 24 hour(s))  CBC     Status: Abnormal   Collection Time: 12/14/14  5:38 AM  Result Value Ref Range   WBC 10.5 4.0 - 10.5 K/uL   RBC 3.64 (L) 3.87 - 5.11 MIL/uL   Hemoglobin 11.6 (L) 12.0 - 15.0 g/dL   HCT 34.7 (L) 36.0 - 46.0 %   MCV 95.3 78.0 - 100.0 fL   MCH 31.9 26.0 - 34.0 pg   MCHC 33.4 30.0 - 36.0 g/dL   RDW 13.1 11.5 - 15.5 %   Platelets 144 (L) 150 - 400 K/uL   A/P: D/C         Instructions given

## 2014-12-14 NOTE — Discharge Summary (Signed)
Physician Discharge Summary  Patient ID: [name] [name] MRN: [id number] DOB/AGE: 1945-07-22 69 y.o.  Admit date: 12/13/2014 Discharge date: 12/14/2014  Admission Diagnoses:Pelvic Prolapse  Discharge Diagnoses:  Active Problems:   Pelvic prolapse   Discharged Condition: good  Hospital Course: good post operative resumption of bowel function. Voiding well and ambulating. Good pain control.  Consults: None  Significant Diagnostic Studies: labs:  Results for orders placed or performed during the hospital encounter of 12/13/14 (from the past 24 hour(s))  CBC     Status: Abnormal   Collection Time: 12/14/14  5:38 AM  Result Value Ref Range   WBC 10.5 4.0 - 10.5 K/uL   RBC 3.64 (L) 3.87 - 5.11 MIL/uL   Hemoglobin 11.6 (L) 12.0 - 15.0 g/dL   HCT 34.7 (L) 36.0 - 46.0 %   MCV 95.3 78.0 - 100.0 fL   MCH 31.9 26.0 - 34.0 pg   MCHC 33.4 30.0 - 36.0 g/dL   RDW 13.1 11.5 - 15.5 %   Platelets 144 (L) 150 - 400 K/uL    Treatments: surgery: LAVH/BSO/A&P repair/SSLS  Discharge Exam: Blood pressure 92/52, pulse 67, temperature 97.7 F (36.5 C), temperature source Oral, resp. rate 20, height 5\' 8"  (1.727 m), weight 147 lb (66.679 kg), SpO2 95 %. General appearance: alert, cooperative and no distress GI: soft, non-tender; bowel sounds normal; no masses,  no organomegaly  Disposition: 01-Home or Self Care     Medication List    STOP taking these medications        OVER THE COUNTER MEDICATION      TAKE these medications        CALCIUM-MAGNESIUM-VITAMIN D PO  Take 1 capsule by mouth daily.     cholecalciferol 1000 UNITS tablet  Commonly known as:  VITAMIN D  Take 1,000 Units by mouth daily.     citalopram 20 MG tablet  Commonly known as:  CELEXA  Take 20 mg by mouth daily.     hydroxychloroquine 200 MG tablet  Commonly known as:  PLAQUENIL  Take 200 mg by mouth daily.     ibuprofen 600 MG tablet  Commonly known as:  ADVIL,MOTRIN  Take 1 tablet (600 mg total) by  mouth every 6 (six) hours as needed (mild pain).     oxyCODONE-acetaminophen 5-325 MG tablet  Commonly known as:  PERCOCET/ROXICET  Take 1-2 tablets by mouth every 6 (six) hours as needed (moderate to severe pain (when tolerating fluids)).           Follow-up Information    Follow up In 2 weeks.      Signed: Casmer Yepiz II,Zoha Spranger E 12/14/2014, 1:34 PM

## 2014-12-14 NOTE — Progress Notes (Signed)
Patient discharge instructions reviewed, all questions answered at this time. To follow up with Dr Gaetano Net in 2 weeks.  Patient vital signs stable and pain controlled.  Ambulated to private vehicle by RN and family member.  Patient to private residence.

## 2015-12-03 ENCOUNTER — Other Ambulatory Visit: Payer: Self-pay | Admitting: Rheumatology

## 2015-12-03 DIAGNOSIS — L4050 Arthropathic psoriasis, unspecified: Secondary | ICD-10-CM

## 2015-12-04 ENCOUNTER — Other Ambulatory Visit: Payer: Self-pay | Admitting: Rheumatology

## 2015-12-04 DIAGNOSIS — M25562 Pain in left knee: Secondary | ICD-10-CM

## 2015-12-12 ENCOUNTER — Ambulatory Visit
Admission: RE | Admit: 2015-12-12 | Discharge: 2015-12-12 | Disposition: A | Discharge status: Home / Self Care | Payer: Medicare Other | Source: Ambulatory Visit | Attending: Rheumatology | Admitting: Rheumatology

## 2015-12-12 DIAGNOSIS — M25562 Pain in left knee: Secondary | ICD-10-CM

## 2015-12-14 ENCOUNTER — Other Ambulatory Visit: Payer: Medicare Other

## 2016-01-12 ENCOUNTER — Ambulatory Visit (HOSPITAL_BASED_OUTPATIENT_CLINIC_OR_DEPARTMENT_OTHER)
Admit: 2016-01-12 | Discharge: 2016-01-12 | Disposition: A | Discharge status: Home / Self Care | Payer: Medicare Other | Attending: Emergency Medicine | Admitting: Emergency Medicine

## 2016-01-12 ENCOUNTER — Encounter (HOSPITAL_BASED_OUTPATIENT_CLINIC_OR_DEPARTMENT_OTHER): Payer: Self-pay | Admitting: Adult Health

## 2016-01-12 ENCOUNTER — Emergency Department (HOSPITAL_BASED_OUTPATIENT_CLINIC_OR_DEPARTMENT_OTHER): Payer: Medicare Other

## 2016-01-12 ENCOUNTER — Emergency Department (HOSPITAL_BASED_OUTPATIENT_CLINIC_OR_DEPARTMENT_OTHER)
Admission: EM | Admit: 2016-01-12 | Discharge: 2016-01-12 | Disposition: A | Discharge status: Home / Self Care | Payer: Medicare Other | Attending: Emergency Medicine | Admitting: Emergency Medicine

## 2016-01-12 DIAGNOSIS — M25562 Pain in left knee: Secondary | ICD-10-CM | POA: Diagnosis present

## 2016-01-12 MED ORDER — HYDROCODONE-ACETAMINOPHEN 5-325 MG PO TABS: 1.0000 | ORAL_TABLET | Freq: Four times a day (QID) | ORAL | 0 refills | Status: DC | PRN

## 2016-01-12 NOTE — ED Notes (Signed)
To xray by stretcher, alert, NAD, calm, interactive.

## 2016-01-12 NOTE — ED Notes (Signed)
EDP at BS 

## 2016-01-12 NOTE — ED Triage Notes (Signed)
Presents with left knee pain began yesterday after wearing high heels out to dinner,  Today pain began to radiate down back of calf and knee became swollen. Denies recent air travel or Estrogen use, denies smoking. Pain is worse with touch and ambulating.

## 2016-01-12 NOTE — ED Notes (Signed)
Back from xray, no changes.  ?

## 2016-01-12 NOTE — ED Provider Notes (Signed)
TIME SEEN: 12:30 AM  CHIEF COMPLAINT: Left knee pain, left calf pain  HPI: Pt is a 70 y.o. female with history of rheumatoid arthritis on Plaquenil, depression and anxiety who presents the emergency department with left knee pain and left calf pain. States that she has had problem with her left knee for years. No history of previous left knee surgery. Had an MRI of her left knee 3-4 weeks ago and told that her "kneecap was bone on bone" and that she had a Baker cyst. Had a cortisone shot at [place] last week which she reports no significant improvement. States yesterday she was wearing 2-3 inch heels at a dinner party and then when she got home she had pain in her posterior left knee, medial left knee and down her left calf. States she got concerned because she began googling her symptoms and was concerned about a DVT. Denies history of previous DVT, recent prolonged immobilization such as long flight or hospitalization, fracture, surgery, trauma. Not on exogenous estrogen. Denies chest pain or shortness of breath. No unilateral leg swelling. Denies fever, redness or warmth of her joint. Is able to ambulate but it causes her to have increased pain to flex her knee and walk on it.  States it partly 2 weeks ago she did fall in her bathroom and hit her left knee and left elbow but it had improved with rest, ice. No other known injury.  ROS: See HPI Constitutional: no fever  Eyes: no drainage  ENT: no runny nose   Cardiovascular:  no chest pain  Resp: no SOB  GI: no vomiting GU: no dysuria Integumentary: no rash  Allergy: no hives  Musculoskeletal: Left knee pain Neurological: no slurred speech ROS otherwise negative  PAST MEDICAL HISTORY/PAST SURGICAL HISTORY:  Past Medical History:  Diagnosis Date  . Anxiety disorder   . Arthritis   . Depression   . RA (rheumatoid arthritis) (HCC)     MEDICATIONS:  Prior to Admission medications   Medication Sig Start Date End Date Taking?  Authorizing Provider  CALCIUM-MAGNESIUM-VITAMIN D PO Take 1 capsule by mouth daily.    Historical Provider, MD  cholecalciferol (VITAMIN D) 1000 UNITS tablet Take 1,000 Units by mouth daily.    Historical Provider, MD  citalopram (CELEXA) 20 MG tablet Take 20 mg by mouth daily. 10/26/14   Historical Provider, MD  hydroxychloroquine (PLAQUENIL) 200 MG tablet Take 200 mg by mouth daily.     Historical Provider, MD  ibuprofen (ADVIL,MOTRIN) 600 MG tablet Take 1 tablet (600 mg total) by mouth every 6 (six) hours as needed (mild pain). 12/14/14   Everlene Farrier, MD  oxyCODONE-acetaminophen (PERCOCET/ROXICET) 5-325 MG tablet Take 1-2 tablets by mouth every 6 (six) hours as needed (moderate to severe pain (when tolerating fluids)). 12/14/14   Everlene Farrier, MD    ALLERGIES:  No Known Allergies  SOCIAL HISTORY:  Social History  Substance Use Topics  . Smoking status: Never Smoker  . Smokeless tobacco: Never Used  . Alcohol use Yes     Comment: 2-3 glasses of wine some days "I probably drink  more than I should"    FAMILY HISTORY: History reviewed. No pertinent family history.  EXAM: BP 152/82   Pulse 85   Temp 97.6 F (36.4 C) (Oral)   Resp 16   SpO2 97%  CONSTITUTIONAL: Alert and oriented and responds appropriately to questions. Well-appearing; well-nourished, Elderly but appears younger than stated age HEAD: Normocephalic EYES: Conjunctivae clear, PERRL, EOMI ENT: normal  nose; no rhinorrhea; moist mucous membranes NECK: Supple, no meningismus, no nuchal rigidity, no LAD  CARD: RRR; S1 and S2 appreciated; no murmurs, no clicks, no rubs, no gallops RESP: Normal chest excursion without splinting or tachypnea; breath sounds clear and equal bilaterally; no wheezes, no rhonchi, no rales, no hypoxia or respiratory distress, speaking full sentences ABD/GI: Normal bowel sounds; non-distended; soft, non-tender, no rebound, no guarding, no peritoneal signs, no hepatosplenomegaly BACK:  The back  appears normal and is non-tender to palpation, there is no CVA tenderness EXT: Tender to palpation over the medial left knee and over the patellar tendon. No bony deformity. No erythema, warmth but does have a moderate joint effusion. She does have some mild left-sided Tenderness without significant swelling. 2+ DP pulses bilaterally. No ligamentous laxity of the left knee. No pain over the left foot, ankle or hip. Pain with flexion of the left knee but does have full range of motion in this joint. Able to fully extend the left knee. Normal ROM in all joints; otherwise extremities are non-tender to palpation; no edema; normal capillary refill; no cyanosis, no right-sided calf tenderness or swelling    SKIN: Normal color for age and race; warm; no rash NEURO: Moves all extremities equally, sensation to light touch intact diffusely, cranial nerves II through XII intact, normal speech PSYCH: The patient's mood and manner are appropriate. Grooming and personal hygiene are appropriate.  MEDICAL DECISION MAKING: Patient here with left knee pain. Likely exacerbated chronic arthritis after wearing high heels yesterday. Will obtain x-ray to evaluate for any bony injury. No sign of gout, septic arthritis on exam. No ligamentous laxity. Neurovascularly intact distally. She is concerned about a DVT. I suspect most of her pain in her calf is probably from radiation from her knee pain and from her Baker cyst but I recommend that she return in the morning for a venous Doppler. She denies any chest pain or shortness of breath. She declines pain medication at this time.  ED PROGRESS: 1:50 AM   Patient still declines pain medication at this time. She has been able to ambulate. X-ray shows no acute abnormality other to moderate sized effusion. Suspect this is reactive. Again no sign of infection of this joint. Have recommended close follow up with her orthopedic physician. We'll schedule a venous Doppler for her to come back  later in the morning. I recommended ice, elevation and rest. We'll discharge with prescription for Vicodin to take as needed. Discussed return precautions. She is comfortable with this plan. Her husband is here to drive her home.    At this time, I do not feel there is any life-threatening condition present. I have reviewed and discussed all results (EKG, imaging, lab, urine as appropriate) and exam findings with patient/family. I have reviewed nursing notes and appropriate previous records.  I feel the patient is safe to be discharged home without further emergent workup and can continue workup as an outpatient as needed. Discussed usual and customary return precautions. Patient/family verbalize understanding and are comfortable with this plan.  Outpatient follow-up has been provided. All questions have been answered.    [place], DO 01/12/16 [phone]

## 2016-01-19 ENCOUNTER — Emergency Department (HOSPITAL_BASED_OUTPATIENT_CLINIC_OR_DEPARTMENT_OTHER)
Admit: 2016-01-19 | Discharge: 2016-01-19 | Disposition: A | Discharge status: Home / Self Care | Payer: Medicare Other | Attending: Emergency Medicine | Admitting: Emergency Medicine

## 2016-01-19 ENCOUNTER — Emergency Department (HOSPITAL_COMMUNITY)
Admission: EM | Admit: 2016-01-19 | Discharge: 2016-01-19 | Disposition: A | Discharge status: Home / Self Care | Payer: Medicare Other | Attending: Emergency Medicine | Admitting: Emergency Medicine

## 2016-01-19 ENCOUNTER — Encounter (HOSPITAL_COMMUNITY): Payer: Self-pay

## 2016-01-19 DIAGNOSIS — S8991XA Unspecified injury of right lower leg, initial encounter: Secondary | ICD-10-CM | POA: Diagnosis present

## 2016-01-19 DIAGNOSIS — M660 Rupture of popliteal cyst: Secondary | ICD-10-CM | POA: Insufficient documentation

## 2016-01-19 DIAGNOSIS — Y929 Unspecified place or not applicable: Secondary | ICD-10-CM | POA: Diagnosis not present

## 2016-01-19 DIAGNOSIS — X58XXXA Exposure to other specified factors, initial encounter: Secondary | ICD-10-CM | POA: Insufficient documentation

## 2016-01-19 DIAGNOSIS — M7989 Other specified soft tissue disorders: Secondary | ICD-10-CM | POA: Diagnosis not present

## 2016-01-19 DIAGNOSIS — M79609 Pain in unspecified limb: Secondary | ICD-10-CM | POA: Diagnosis not present

## 2016-01-19 DIAGNOSIS — S8011XA Contusion of right lower leg, initial encounter: Secondary | ICD-10-CM | POA: Diagnosis not present

## 2016-01-19 DIAGNOSIS — Y939 Activity, unspecified: Secondary | ICD-10-CM | POA: Insufficient documentation

## 2016-01-19 DIAGNOSIS — Y999 Unspecified external cause status: Secondary | ICD-10-CM | POA: Insufficient documentation

## 2016-01-19 MED ORDER — OXYCODONE-ACETAMINOPHEN 5-325 MG PO TABS: 1.0000 | ORAL_TABLET | Freq: Three times a day (TID) | ORAL | 0 refills | Status: DC | PRN

## 2016-01-19 NOTE — ED Notes (Signed)
EDP at bedside  

## 2016-01-19 NOTE — Progress Notes (Signed)
VASCULAR LAB PRELIMINARY  PRELIMINARY  PRELIMINARY  PRELIMINARY  Left lower extremity venous duplex completed.    Preliminary report:  There is no DVT or SVT noted in the left lower extremity.  There is a partially rupturing Baker's cyst noted in the left popliteal fossa with fluid noted throughout the left calf.  Called report to [facility].  Leland Staszewski, RVT 01/19/2016, 5:43 PM

## 2016-01-19 NOTE — ED Provider Notes (Signed)
[place] DEPT Provider Note   CSN: [id number] Arrival date & time: 01/19/16  1354     History   Chief Complaint No chief complaint on file.   HPI [name] [name] is a 70 y.o. female.  The history is provided by the patient and medical records. No language interpreter was used.   [name] [name] is a 70 y.o. female  With history of rheumatoid arthritis on Plaquenil, depression and anxiety who presents to the Emergency Department complaining of worsening left knee and calf pain. Patient states she has hx of severe arthritis in the knee, but 10 days ago she had much more severe pain behind the knee which radiates to the calf. She was seen at [facility] at onset of symptoms where ultrasound was performed and negative for DVT. She does have a known complex baker's cyst. She then saw her orthopedist, Dr. Alvan Dame this week who offered cortisone shot for pain but she declined, stating they didn't work well in the past. She was given a compression stocking which she wore for two days, but noticed bruising to her leg from just below her knee down to foot. This worried her, so she quit using compression and decided to come to ER for further evaluation. She has been taking vicodin with moderate relief in symptoms. No redness or warmth. No fevers.   Past Medical History:  Diagnosis Date  . Anxiety disorder   . Arthritis   . Depression   . RA (rheumatoid arthritis) [facility])     Patient Active Problem List   Diagnosis Date Noted  . Pelvic prolapse 12/13/2014  . Alcohol abuse 10/31/2014  . Pre-syncope 10/05/2014  . Dizziness 10/04/2014    Past Surgical History:  Procedure Laterality Date  . ANTERIOR AND POSTERIOR REPAIR WITH SACROSPINOUS FIXATION N/A 12/13/2014   Procedure: ANTERIOR AND POSTERIOR REPAIR WITH SACROSPINOUS LIGAMENT SUSPENSION;  Surgeon: Everlene Farrier, MD;  Location: [facility];  Service: Gynecology;  Laterality: N/A;  . BRONCHOSCOPY    . COLONOSCOPY      OB History    No  data available       Home Medications    Prior to Admission medications   Medication Sig Start Date End Date Taking? Authorizing Provider  CALCIUM-MAGNESIUM-VITAMIN D PO Take 1 capsule by mouth daily.   Yes Historical Provider, MD  cholecalciferol (VITAMIN D) 1000 UNITS tablet Take 1,000 Units by mouth daily.   Yes Historical Provider, MD  citalopram (CELEXA) 20 MG tablet Take 20 mg by mouth daily. 10/26/14  Yes Historical Provider, MD  diclofenac sodium (VOLTAREN) 1 % GEL Apply 2 g topically 4 (four) times daily.   Yes Historical Provider, MD  hydroxychloroquine (PLAQUENIL) 200 MG tablet Take 200 mg by mouth daily.    Yes Historical Provider, MD  ibuprofen (ADVIL,MOTRIN) 600 MG tablet Take 1 tablet (600 mg total) by mouth every 6 (six) hours as needed (mild pain). 12/14/14  Yes Everlene Farrier, MD  oxyCODONE-acetaminophen (PERCOCET/ROXICET) 5-325 MG tablet Take 1 tablet by mouth every 8 (eight) hours as needed for severe pain. 01/19/16   Barry, PA-C    Family History No family history on file.  Social History Social History  Substance Use Topics  . Smoking status: Never Smoker  . Smokeless tobacco: Never Used  . Alcohol use Yes     Comment: 2-3 glasses of wine some days "I probably drink  more than I should"     Allergies   Patient has no known allergies.  Review of Systems Review of Systems  Constitutional: Negative for chills and fever.  HENT: Negative for congestion.   Eyes: Negative for visual disturbance.  Respiratory: Negative for cough and shortness of breath.   Cardiovascular: Negative for chest pain and palpitations.  Gastrointestinal: Negative for abdominal pain, nausea and vomiting.  Genitourinary: Negative for dysuria.  Musculoskeletal: Positive for arthralgias and myalgias.  Skin: Positive for color change (Bruising).  Neurological: Negative for headaches.     Physical Exam Updated Vital Signs BP 124/83   Pulse 64   Temp 98.5 F (36.9 C)  (Oral)   Resp 15   Ht 5\' 8"  (1.727 m)   Wt 68 kg   SpO2 100%   BMI 22.81 kg/m   Physical Exam  Constitutional: She is oriented to person, place, and time. She appears well-developed and well-nourished. No distress.  HENT:  Head: Normocephalic and atraumatic.  Cardiovascular: Normal rate, regular rhythm and normal heart sounds.   No murmur heard. 2+ DP pulses bilaterally.   Pulmonary/Chest: Effort normal and breath sounds normal. No respiratory distress.  Abdominal: Soft. She exhibits no distension. There is no tenderness.  Musculoskeletal:  Knee with full ROM. Tenderness to palpation behind knee and down to the mid calf. Ligaments intact, no laxity. Good cap refill and sensation intact.   Neurological: She is alert and oriented to person, place, and time.  Skin: Skin is warm and dry.  Bruising to RLE from posterior calf down to foot. No erythema or warmth. No open wounds.   Nursing note and vitals reviewed.    ED Treatments / Results  Labs (all labs ordered are listed, but only abnormal results are displayed) Labs Reviewed - No data to display  EKG  EKG Interpretation None       Radiology No results found.  Procedures Procedures (including critical care time)  Medications Ordered in ED Medications - No data to display   Initial Impression / Assessment and Plan / ED Course  I have reviewed the triage vital signs and the nursing notes.  Pertinent labs & imaging results that were available during my care of the patient were reviewed by me and considered in my medical decision making (see chart for details).  Clinical Course    [name] [name] is a 70 y.o. female who presents to ED for persistent knee pain x 10 days. She has new bruising along the lower leg and pain seems to be worse in the calf now. She has a known baker's cyst to the knee. DVT study was performed on 11/26 and negative. On exam, no erythema or warmth. Does not appear infectious. Repeat  ultrasound Shows no DVT. Partially ruptured Baker's cyst in the left popliteal fossa with fluid noted throughout the left calf. This is likely source of pain and discoloration. Results discussed with patient at bedside. Symptomatic home care instructions were discussed. Orthopedic follow-up discussed. All questions answered.  Patient seen by and discussed with Dr. Maryan Rued who agrees with treatment plan.    Final Clinical Impressions(s) / ED Diagnoses   Final diagnoses:  Baker's cyst, ruptured    New Prescriptions Discharge Medication List as of 01/19/2016  6:19 PM       Ozella Almond Bretta Fees, PA-C 01/19/16 [place], MD 01/21/16 2123

## 2016-01-19 NOTE — ED Triage Notes (Signed)
Patient here with ongoing left kne pain and left lower leg discomfort for 10 days. Has been seen at [facility] and had negative xray and negative doppler, seen by ortho office for same. States that her pain to knee is continuing and now increased pain with ambulation. Concerned with swelling and reported bruising.

## 2016-01-19 NOTE — ED Notes (Signed)
Pt transported to US

## 2016-01-19 NOTE — Discharge Instructions (Signed)
Please call your orthopedist to schedule a follow-up appointment if symptoms are not improving in the next 3-4 days. Pain medication only as needed for severe pain. Return to ER for new or worsening symptoms, any additional concerns.

## 2016-01-30 ENCOUNTER — Other Ambulatory Visit (HOSPITAL_COMMUNITY): Payer: Self-pay | Admitting: Physician Assistant

## 2016-01-30 ENCOUNTER — Ambulatory Visit (HOSPITAL_COMMUNITY)
Admission: RE | Admit: 2016-01-30 | Discharge: 2016-01-30 | Disposition: A | Discharge status: Home / Self Care | Payer: Medicare Other | Source: Ambulatory Visit | Attending: Cardiology | Admitting: Cardiology

## 2016-01-30 DIAGNOSIS — M2242 Chondromalacia patellae, left knee: Secondary | ICD-10-CM | POA: Insufficient documentation

## 2016-02-18 DIAGNOSIS — H00021 Hordeolum internum right upper eyelid: Secondary | ICD-10-CM | POA: Diagnosis not present

## 2016-02-19 DIAGNOSIS — J101 Influenza due to other identified influenza virus with other respiratory manifestations: Secondary | ICD-10-CM | POA: Diagnosis not present

## 2016-02-26 ENCOUNTER — Encounter (HOSPITAL_COMMUNITY): Admission: RE | Admit: 2016-02-26 | Payer: Medicare Other | Source: Ambulatory Visit

## 2016-03-16 ENCOUNTER — Telehealth: Payer: Self-pay | Admitting: *Deleted

## 2016-03-16 DIAGNOSIS — R300 Dysuria: Secondary | ICD-10-CM | POA: Diagnosis not present

## 2016-03-16 DIAGNOSIS — R9431 Abnormal electrocardiogram [ECG] [EKG]: Secondary | ICD-10-CM | POA: Diagnosis not present

## 2016-03-16 NOTE — Telephone Encounter (Signed)
Pt of Dr. Johnsie Cancel.  Patient came in w dropoff request for surgical clearance for a knee surgery by Dr. Alvan Dame, scheduled for 2/20. Also included an EKG performed at [facility] for our review.  EKG strips given to medical records for scanning. Routed to [facility] triage to f/u.

## 2016-03-16 NOTE — Telephone Encounter (Signed)
Patient of Dr. Johnsie Cancel, per B. Hager, [place] note from 10/2014 Has not seen cardiology provider since 10/2014 and has not seen MD in the office   Routed to Dr. Johnsie Cancel to advise

## 2016-03-17 NOTE — Telephone Encounter (Signed)
EKG has been scanned into chart- see under media tab in chart review.

## 2016-03-17 NOTE — Telephone Encounter (Signed)
Can't find ECG in epic ??

## 2016-03-18 ENCOUNTER — Telehealth: Payer: Self-pay | Admitting: Cardiology

## 2016-03-18 ENCOUNTER — Other Ambulatory Visit: Payer: Self-pay | Admitting: Obstetrics and Gynecology

## 2016-03-18 DIAGNOSIS — Z1231 Encounter for screening mammogram for malignant neoplasm of breast: Secondary | ICD-10-CM

## 2016-03-18 NOTE — Telephone Encounter (Signed)
ECG abnormal before in 2016 had normal myovue at that time ok for surgery with Dr Alvan Dame if she has not had any issues or chest pain since then

## 2016-03-18 NOTE — Telephone Encounter (Signed)
Received records from [facility] for appointment on 03/20/16 with Dr Martinique.  Records put with Dr Doug Sou schedule for 03/20/16. lp

## 2016-03-19 DIAGNOSIS — N390 Urinary tract infection, site not specified: Secondary | ICD-10-CM | POA: Diagnosis not present

## 2016-03-20 ENCOUNTER — Encounter: Payer: Self-pay | Admitting: Cardiology

## 2016-03-20 ENCOUNTER — Ambulatory Visit (INDEPENDENT_AMBULATORY_CARE_PROVIDER_SITE_OTHER): Payer: Medicare HMO | Admitting: Cardiology

## 2016-03-20 ENCOUNTER — Telehealth: Payer: Self-pay

## 2016-03-20 DIAGNOSIS — R9431 Abnormal electrocardiogram [ECG] [EKG]: Secondary | ICD-10-CM

## 2016-03-20 NOTE — Telephone Encounter (Signed)
Dr.Jordan cleared patient for upcoming knee surgery.03/20/16 office note faxed to Dr.Matthew Alvan Dame at fax # [phone].

## 2016-03-20 NOTE — Patient Instructions (Signed)
You are clear to have knee surgery

## 2016-03-20 NOTE — Progress Notes (Signed)
Cardiology Office Note    Date:  03/20/2016   ID:  Lenae, Bodoh 1945-04-13, MRN [id number]  PCP:  Merrilee Seashore, MD  Cardiologist:  Savvy Peeters Martinique, MD    History of Present Illness:  [name] [name] is a 71 y.o. female seen at the request of Dr. Alvan Dame for pre op clearance for left TKR. She was noted in pre op evaluation to have an abnormal Ecg. Of note, she was admitted in August 2016 with near syncope, left arm pain and numbness. She had some anterior T wave abnormalities on Ecg. She ruled out for MI. Subsequent evaluation with Lexiscan Myoview and Echo were unremarkable. She was under considerable stress at that time which was felt to contribute to her symptoms. She does have a history of panic attacks and RA. No history of HTN, HL, DM, tobacco use, or family history of premature CAD. Today she denies any cardiac symptoms. No chest pain, SOB, palpitations, dizziness, edema, orthopnea, or PND. She did have a ruptured Baker's cyst on the left with some swelling in left foot.   Past Medical History:  Diagnosis Date  . Anxiety disorder   . Arthritis   . Depression   . Panic attack   . RA (rheumatoid arthritis) ([place])   . Vitamin D deficiency     Past Surgical History:  Procedure Laterality Date  . ANTERIOR AND POSTERIOR REPAIR WITH SACROSPINOUS FIXATION N/A 12/13/2014   Procedure: ANTERIOR AND POSTERIOR REPAIR WITH SACROSPINOUS LIGAMENT SUSPENSION;  Surgeon: Everlene Farrier, MD;  Location: [facility];  Service: Gynecology;  Laterality: N/A;  . BRONCHOSCOPY    . COLONOSCOPY      Current Medications: Outpatient Medications Prior to Visit  Medication Sig Dispense Refill  . acetaminophen (TYLENOL) 500 MG tablet Take 1,000 mg by mouth every 6 (six) hours as needed (pain).    . CALCIUM-MAGNESIUM-VITAMIN D PO Take 1 capsule by mouth daily.    . cholecalciferol (VITAMIN D) 1000 UNITS tablet Take 1,000 Units by mouth daily.    . citalopram (CELEXA) 20 MG tablet Take 20 mg by  mouth daily.  0  . hydroxychloroquine (PLAQUENIL) 200 MG tablet Take 200 mg by mouth daily.     . Probiotic Product (ALIGN PO) Take 1 tablet by mouth daily.    . sodium chloride (OCEAN) 0.65 % SOLN nasal spray Place 1 spray into both nostrils every 6 (six) hours as needed for congestion.    Marland Kitchen oseltamivir (TAMIFLU) 75 MG capsule Take 75 mg by mouth 2 (two) times daily.     No facility-administered medications prior to visit.      Allergies:   Clinoril [sulindac] and Voltaren [diclofenac sodium]   Social History   Social History  . Marital status: Married    Spouse name: N/A  . Number of children: N/A  . Years of education: N/A   Social History Main Topics  . Smoking status: Never Smoker  . Smokeless tobacco: Never Used  . Alcohol use Yes     Comment: 2-3 glasses of wine some days "I probably drink  more than I should"  . Drug use: No  . Sexual activity: Yes    Birth control/ protection: Post-menopausal   Other Topics Concern  . None   Social History Narrative  . None     Family History:  The patient's family history includes Heart attack in her mother.   ROS:   Please see the history of present illness.    ROS All other systems  reviewed and are negative.   PHYSICAL EXAM:   VS:  BP (!) 141/91 (BP Location: Left Arm, Patient Position: Sitting, Cuff Size: Normal)   Pulse 85   Ht 5\' 8"  (1.727 m)   Wt 156 lb (70.8 kg)   BMI 23.72 kg/m    GEN: Well nourished, well developed, in no acute distress  HEENT: normal  Neck: no JVD, carotid bruits, or masses Cardiac: RRR; no murmurs, rubs, or gallops,no edema  Respiratory:  clear to auscultation bilaterally, normal work of breathing GI: soft, nontender, nondistended, + BS MS: no deformity or atrophy  Skin: warm and dry, no rash Neuro:  Alert and Oriented x 3, Strength and sensation are intact Psych: euthymic mood, full affect  Wt Readings from Last 3 Encounters:  03/20/16 156 lb (70.8 kg)  01/19/16 150 lb (68 kg)    01/12/16 150 lb (68 kg)      Studies/Labs Reviewed:   EKG:  EKG is done 03/16/16 reviewed.  The ekg  demonstrates NSR with rate 75. Anterior T wave inversion. Findings are not significantly changed from August 2016. I have personally reviewed and interpreted this study.   Recent Labs: No results found for requested labs within last 8760 hours.   Lipid Panel    Component Value Date/Time   CHOL 209 (H) 10/05/2014 0217   TRIG 42 10/05/2014 0217   HDL 76 10/05/2014 0217   CHOLHDL 2.8 10/05/2014 0217   VLDL 8 10/05/2014 0217   LDLCALC 125 (H) 10/05/2014 0217    Additional studies/ records that were reviewed today include:  Myoview 10/05/14:Study Result   CLINICAL DATA:  Chest pain, jaw pain and left arm weakness. Syncope.  EXAM: MYOCARDIAL IMAGING WITH SPECT (REST AND PHARMACOLOGIC-STRESS)  GATED LEFT VENTRICULAR WALL MOTION STUDY  LEFT VENTRICULAR EJECTION FRACTION  TECHNIQUE: Standard myocardial SPECT imaging was performed after resting intravenous injection of 10 mCi Tc-11m sestamibi. Subsequently, intravenous infusion of Lexiscan was performed under the supervision of the Cardiology staff. At peak effect of the drug, 30 mCi Tc-10m sestamibi was injected intravenously and standard myocardial SPECT imaging was performed. Quantitative gated imaging was also performed to evaluate left ventricular wall motion, and estimate left ventricular ejection fraction.  COMPARISON:  None.  FINDINGS: Perfusion: No decreased activity in the left ventricle on stress imaging to suggest reversible ischemia or infarction.  Wall Motion: Normal left ventricular wall motion. No left ventricular dilation.  Left Ventricular Ejection Fraction: 80 %  End diastolic volume 63 ml  End systolic volume 13 ml  IMPRESSION: 1. No reversible ischemia or infarction.  2. Normal left ventricular wall motion.  3. Left ventricular ejection fraction of 80%  4. Low-risk stress test  findings*.  *2012 Appropriate Use Criteria for Coronary Revascularization Focused Update: J Am Coll Cardiol. N6492421. http://content.airportbarriers.com.aspx?articleid=1201161   Electronically Signed   By: Marijo Sanes M.D.   On: 10/05/2014 13:57   Echo: 10/12/14:Study Conclusions  - Left ventricle: The cavity size was normal. There was mild focal   basal and mild concentric hypertrophy of the septum. Systolic   function was normal. The estimated ejection fraction was in the   range of 55% to 60%. Wall motion was normal; there were no   regional wall motion abnormalities. Left ventricular diastolic   function parameters were normal. - Atrial septum: No defect or patent foramen ovale was identified.   ASSESSMENT:    1. Abnormal ECG      PLAN:  In order of problems listed above:  1. She  has chronic T wave changes. Extensive cardiac evaluation in August 2016 with lexiscan Myoview and Echo were normal. No active cardiac symptoms or physical findings. She is clear to proceed with TKR and her cardiac risk is low.    Medication Adjustments/Labs and Tests Ordered: Current medicines are reviewed at length with the patient today.  Concerns regarding medicines are outlined above.  Medication changes, Labs and Tests ordered today are listed in the Patient Instructions below. Patient Instructions  You are clear to have knee surgery      Signed, Cheronda Erck Martinique, MD  03/20/2016 11:25 AM    [facility] [address] [place], [place], [address] [phone]

## 2016-03-27 NOTE — Patient Instructions (Signed)
[name] [name]  03/27/2016   Your procedure is scheduled on: 04/07/2016    Report to [facility]  Entrance take [facility]  elevators to 3rd floor to  [place] at      1245pm.  Call this number if you have problems the morning of surgery [phone]   Remember: ONLY 1 PERSON MAY GO WITH YOU TO SHORT STAY TO GET  READY MORNING OF [place].  Do not eat food after midnite,.  May have clear liquids from 12 midnite until 0900am morning of surgery then nothing by mouth.      Take these medicines the morning of surgery with A SIP OF WATER:   Celexa, Ocean nasal spray if needed                                 You may not have any metal on your body including hair pins and              piercings  Do not wear jewelry, make-up, lotions, powders or perfumes, deodorant             Do not wear nail polish.  Do not shave  48 hours prior to surgery.     Do not bring valuables to the hospital. [place].  Contacts, dentures or bridgework may not be worn into surgery.  Leave suitcase in the car. After surgery it may be brought to your room.                     Please read over the following fact sheets you were given: _____________________________________________________________________                CLEAR LIQUID DIET   Foods Allowed                                                                     Foods Excluded  Coffee and tea, regular and decaf                             liquids that you cannot  Plain Jell-O in any flavor                                             see through such as: Fruit ices (not with fruit pulp)                                     milk, soups, orange juice  Iced Popsicles                                    All solid food Carbonated beverages,  regular and diet                                    Cranberry, grape and apple juices Sports drinks like Gatorade Lightly  seasoned clear broth or consume(fat free) Sugar, honey syrup  Sample Menu Breakfast                                Lunch                                     Supper Cranberry juice                    Beef broth                            Chicken broth Jell-O                                     Grape juice                           Apple juice Coffee or tea                        Jell-O                                      Popsicle                                                Coffee or tea                        Coffee or tea  _____________________________________________________________________  [facility] - Preparing for Surgery Before surgery, you can play an important role.  Because skin is not sterile, your skin needs to be as free of germs as possible.  You can reduce the number of germs on your skin by washing with CHG (chlorahexidine gluconate) soap before surgery.  CHG is an antiseptic cleaner which kills germs and bonds with the skin to continue killing germs even after washing. Please DO NOT use if you have an allergy to CHG or antibacterial soaps.  If your skin becomes reddened/irritated stop using the CHG and inform your nurse when you arrive at Short Stay. Do not shave (including legs and underarms) for at least 48 hours prior to the first CHG shower.  You may shave your face/neck. Please follow these instructions carefully:  1.  Shower with CHG Soap the night before surgery and the  morning of Surgery.  2.  If you choose to wash your hair, wash your hair first as usual with your  normal  shampoo.  3.  After you shampoo, rinse your hair and body thoroughly to remove the  shampoo.  4.  Use CHG as you would any other liquid soap.  You can apply chg directly  to the skin and wash                       Gently with a scrungie or clean washcloth.  5.  Apply the CHG Soap to your body ONLY FROM THE NECK DOWN.   Do not use on face/ open                            Wound or open sores. Avoid contact with eyes, ears mouth and genitals (private parts).                       Wash face,  Genitals (private parts) with your normal soap.             6.  Wash thoroughly, paying special attention to the area where your surgery  will be performed.  7.  Thoroughly rinse your body with warm water from the neck down.  8.  DO NOT shower/wash with your normal soap after using and rinsing off  the CHG Soap.                9.  Pat yourself dry with a clean towel.            10.  Wear clean pajamas.            11.  Place clean sheets on your bed the night of your first shower and do not  sleep with pets. Day of Surgery : Do not apply any lotions/deodorants the morning of surgery.  Please wear clean clothes to the hospital/surgery center.  FAILURE TO FOLLOW THESE INSTRUCTIONS MAY RESULT IN THE CANCELLATION OF YOUR SURGERY PATIENT SIGNATURE_________________________________  NURSE SIGNATURE__________________________________  ________________________________________________________________________  WHAT IS A BLOOD TRANSFUSION? Blood Transfusion Information  A transfusion is the replacement of blood or some of its parts. Blood is made up of multiple cells which provide different functions.  Red blood cells carry oxygen and are used for blood loss replacement.  White blood cells fight against infection.  Platelets control bleeding.  Plasma helps clot blood.  Other blood products are available for specialized needs, such as hemophilia or other clotting disorders. BEFORE THE TRANSFUSION  Who gives blood for transfusions?   Healthy volunteers who are fully evaluated to make sure their blood is safe. This is blood bank blood. Transfusion therapy is the safest it has ever been in the practice of medicine. Before blood is taken from a donor, a complete history is taken to make sure that person has no history of diseases nor engages in risky social behavior (examples are  intravenous drug use or sexual activity with multiple partners). The donor's travel history is screened to minimize risk of transmitting infections, such as malaria. The donated blood is tested for signs of infectious diseases, such as HIV and hepatitis. The blood is then tested to be sure it is compatible with you in order to minimize the chance of a transfusion reaction. If you or a relative donates blood, this is often done in anticipation of surgery and is not appropriate for emergency situations. It takes many days to process the donated blood. RISKS AND COMPLICATIONS Although transfusion therapy is very safe and saves many lives, the main dangers of transfusion include:   Getting an infectious disease.  Developing a transfusion reaction.  This is an allergic reaction to something in the blood you were given. Every precaution is taken to prevent this. The decision to have a blood transfusion has been considered carefully by your caregiver before blood is given. Blood is not given unless the benefits outweigh the risks. AFTER THE TRANSFUSION  Right after receiving a blood transfusion, you will usually feel much better and more energetic. This is especially true if your red blood cells have gotten low (anemic). The transfusion raises the level of the red blood cells which carry oxygen, and this usually causes an energy increase.  The nurse administering the transfusion will monitor you carefully for complications. HOME CARE INSTRUCTIONS  No special instructions are needed after a transfusion. You may find your energy is better. Speak with your caregiver about any limitations on activity for underlying diseases you may have. SEEK MEDICAL CARE IF:   Your condition is not improving after your transfusion.  You develop redness or irritation at the intravenous (IV) site. SEEK IMMEDIATE MEDICAL CARE IF:  Any of the following symptoms occur over the next 12 hours:  Shaking chills.  You have a  temperature by mouth above 102 F (38.9 C), not controlled by medicine.  Chest, back, or muscle pain.  People around you feel you are not acting correctly or are confused.  Shortness of breath or difficulty breathing.  Dizziness and fainting.  You get a rash or develop hives.  You have a decrease in urine output.  Your urine turns a dark color or changes to pink, red, or brown. Any of the following symptoms occur over the next 10 days:  You have a temperature by mouth above 102 F (38.9 C), not controlled by medicine.  Shortness of breath.  Weakness after normal activity.  The white part of the eye turns yellow (jaundice).  You have a decrease in the amount of urine or are urinating less often.  Your urine turns a dark color or changes to pink, red, or brown. Document Released: 01/31/2000 Document Revised: 04/27/2011 Document Reviewed: 09/19/2007 ExitCare Patient Information 2014 [place].  _______________________________________________________________________  Incentive Spirometer  An incentive spirometer is a tool that can help keep your lungs clear and active. This tool measures how well you are filling your lungs with each breath. Taking long deep breaths may help reverse or decrease the chance of developing breathing (pulmonary) problems (especially infection) following:  A long period of time when you are unable to move or be active. BEFORE THE PROCEDURE   If the spirometer includes an indicator to show your best effort, your nurse or respiratory therapist will set it to a desired goal.  If possible, sit up straight or lean slightly forward. Try not to slouch.  Hold the incentive spirometer in an upright position. INSTRUCTIONS FOR USE  1. Sit on the edge of your bed if possible, or sit up as far as you can in bed or on a chair. 2. Hold the incentive spirometer in an upright position. 3. Breathe out normally. 4. Place the mouthpiece in your mouth and  seal your lips tightly around it. 5. Breathe in slowly and as deeply as possible, raising the piston or the ball toward the top of the column. 6. Hold your breath for 3-5 seconds or for as long as possible. Allow the piston or ball to fall to the bottom of the column. 7. Remove the mouthpiece from your mouth and breathe out normally. 8. Rest for a few seconds and repeat Steps  1 through 7 at least 10 times every 1-2 hours when you are awake. Take your time and take a few normal breaths between deep breaths. 9. The spirometer may include an indicator to show your best effort. Use the indicator as a goal to work toward during each repetition. 10. After each set of 10 deep breaths, practice coughing to be sure your lungs are clear. If you have an incision (the cut made at the time of surgery), support your incision when coughing by placing a pillow or rolled up towels firmly against it. Once you are able to get out of bed, walk around indoors and cough well. You may stop using the incentive spirometer when instructed by your caregiver.  RISKS AND COMPLICATIONS  Take your time so you do not get dizzy or light-headed.  If you are in pain, you may need to take or ask for pain medication before doing incentive spirometry. It is harder to take a deep breath if you are having pain. AFTER USE  Rest and breathe slowly and easily.  It can be helpful to keep track of a log of your progress. Your caregiver can provide you with a simple table to help with this. If you are using the spirometer at home, follow these instructions: [place] IF:   You are having difficultly using the spirometer.  You have trouble using the spirometer as often as instructed.  Your pain medication is not giving enough relief while using the spirometer.  You develop fever of 100.5 F (38.1 C) or higher. SEEK IMMEDIATE MEDICAL CARE IF:   You cough up bloody sputum that had not been present before.  You develop fever  of 102 F (38.9 C) or greater.  You develop worsening pain at or near the incision site. MAKE SURE YOU:   Understand these instructions.  Will watch your condition.  Will get help right away if you are not doing well or get worse. Document Released: 06/15/2006 Document Revised: 04/27/2011 Document Reviewed: 08/16/2006 [facility] Patient Information 2014 [place], [place].   ________________________________________________________________________

## 2016-03-30 ENCOUNTER — Encounter (HOSPITAL_COMMUNITY): Payer: Self-pay

## 2016-03-30 ENCOUNTER — Encounter (HOSPITAL_COMMUNITY)
Admission: RE | Admit: 2016-03-30 | Discharge: 2016-03-30 | Disposition: A | Discharge status: Home / Self Care | Payer: Medicare HMO | Source: Ambulatory Visit | Attending: Orthopedic Surgery | Admitting: Orthopedic Surgery

## 2016-03-30 DIAGNOSIS — M1712 Unilateral primary osteoarthritis, left knee: Secondary | ICD-10-CM | POA: Diagnosis not present

## 2016-03-30 DIAGNOSIS — Z01812 Encounter for preprocedural laboratory examination: Secondary | ICD-10-CM | POA: Insufficient documentation

## 2016-03-30 HISTORY — DX: Urinary tract infection, site not specified: N39.0

## 2016-03-30 HISTORY — DX: Gastro-esophageal reflux disease without esophagitis: K21.9

## 2016-03-30 HISTORY — DX: Influenza due to unidentified influenza virus with other respiratory manifestations: J11.1

## 2016-03-30 LAB — CBC
HCT: 41 % (ref 36.0–46.0)
HEMOGLOBIN: 13.6 g/dL (ref 12.0–15.0)
MCH: 30.8 pg (ref 26.0–34.0)
MCHC: 33.2 g/dL (ref 30.0–36.0)
MCV: 93 fL (ref 78.0–100.0)
Platelets: 205 10*3/uL (ref 150–400)
RBC: 4.41 MIL/uL (ref 3.87–5.11)
RDW: 13.4 % (ref 11.5–15.5)
WBC: 8.1 10*3/uL (ref 4.0–10.5)

## 2016-03-30 LAB — URINALYSIS, ROUTINE W REFLEX MICROSCOPIC
Bilirubin Urine: NEGATIVE
Glucose, UA: NEGATIVE mg/dL
Hgb urine dipstick: NEGATIVE
KETONES UR: NEGATIVE mg/dL
Nitrite: NEGATIVE
PH: 6 (ref 5.0–8.0)
PROTEIN: NEGATIVE mg/dL
Specific Gravity, Urine: 1.017 (ref 1.005–1.030)

## 2016-03-30 LAB — SURGICAL PCR SCREEN
MRSA, PCR: NEGATIVE
Staphylococcus aureus: NEGATIVE

## 2016-03-30 LAB — BASIC METABOLIC PANEL
ANION GAP: 8 (ref 5–15)
BUN: 13 mg/dL (ref 6–20)
CALCIUM: 9.6 mg/dL (ref 8.9–10.3)
CO2: 27 mmol/L (ref 22–32)
Chloride: 103 mmol/L (ref 101–111)
Creatinine, Ser: 0.68 mg/dL (ref 0.44–1.00)
GFR calc Af Amer: >60 mL/min (ref >60)
GLUCOSE: 98 mg/dL (ref 65–99)
Potassium: 4.5 mmol/L (ref 3.5–5.1)
Sodium: 138 mmol/L (ref 135–145)

## 2016-03-30 LAB — ABO/RH: ABO/RH(D): O POS

## 2016-03-30 NOTE — Progress Notes (Signed)
EKG-03/16/16- EPIC  10/12/14- [place] 03/20/16- LOV- Cardiology- epic  Clearance - telephone note 03/20/2014-epic

## 2016-03-30 NOTE — Progress Notes (Addendum)
Patient reports hx of uti 02/2016.  Patient reports uti symptoms again.  U/A done and sent to lab.   Results of UA faxed via epic to Dr Alvan Dame.

## 2016-03-31 ENCOUNTER — Ambulatory Visit: Payer: Medicare HMO

## 2016-04-02 DIAGNOSIS — Z1231 Encounter for screening mammogram for malignant neoplasm of breast: Secondary | ICD-10-CM | POA: Diagnosis not present

## 2016-04-02 NOTE — H&P (Signed)
TOTAL KNEE ADMISSION H&P  Patient is being admitted for left total knee arthroplasty.  Subjective:  Chief Complaint:    Left knee primary OA / pain  HPI: [name] [name], 71 y.o. female, has a history of pain and functional disability in the left knee due to arthritis and has failed non-surgical conservative treatments for greater than 12 weeks to include NSAID's and/or analgesics, corticosteriod injections, viscosupplementation injections and activity modification.  Onset of symptoms was gradual, starting 5+ years ago with gradually worsening course since that time. The patient noted no past surgery on the left knee(s).  Patient currently rates pain in the left knee(s) at 10 out of 10 with activity. Patient has worsening of pain with activity and weight bearing, pain that interferes with activities of daily living, pain with passive range of motion, crepitus and joint swelling.  Patient has evidence of periarticular osteophytes and joint space narrowing by imaging studies. There is no active infection.  Risks, benefits and expectations were discussed with the patient.  Risks including but not limited to the risk of anesthesia, blood clots, nerve damage, blood vessel damage, failure of the prosthesis, infection and up to and including death.  Patient understand the risks, benefits and expectations and wishes to proceed with surgery.   PCP: Merrilee Seashore, MD  D/C Plans:       Home   Post-op Meds:       No Rx given  Tranexamic Acid:      To be given - IV   Decadron:      Is to be given  FYI:     ASA  Norco     Patient Active Problem List   Diagnosis Date Noted  . Abnormal ECG 03/20/2016  . Pelvic prolapse 12/13/2014  . Alcohol abuse 10/31/2014  . Pre-syncope 10/05/2014  . Dizziness 10/04/2014   Past Medical History:  Diagnosis Date  . Anxiety disorder   . Arthritis   . Depression   . Flu    01/2017   . GERD (gastroesophageal reflux disease)   . Panic attack    panic  attacks   . RA (rheumatoid arthritis) ([place])   . Urinary tract infection    hx of 02/2016   . Vitamin D deficiency     Past Surgical History:  Procedure Laterality Date  . ABDOMINAL HYSTERECTOMY    . ANTERIOR AND POSTERIOR REPAIR WITH SACROSPINOUS FIXATION N/A 12/13/2014   Procedure: ANTERIOR AND POSTERIOR REPAIR WITH SACROSPINOUS LIGAMENT SUSPENSION;  Surgeon: Everlene Farrier, MD;  Location: [facility];  Service: Gynecology;  Laterality: N/A;  . bladder tack surgery     . BRONCHOSCOPY    . COLONOSCOPY      No prescriptions prior to admission.   Allergies  Allergen Reactions  . Clinoril [Sulindac] Other (See Comments)    Pt doesn't remember   . Voltaren [Diclofenac Sodium] Other (See Comments)    Unknown doesn't remember      Social History  Substance Use Topics  . Smoking status: Former Research scientist (life sciences)  . Smokeless tobacco: Never Used  . Alcohol use Yes     Comment: 2-3 glasses of wine some days "I probably drink  more than I should"    Family History  Problem Relation Age of Onset  . Heart attack Mother      Review of Systems  Constitutional: Negative.   HENT: Negative.   Eyes: Negative.   Respiratory: Negative.   Cardiovascular: Negative.   Genitourinary: Negative.   Musculoskeletal: Positive for back  pain and joint pain.  Skin: Negative.   Neurological: Negative.   Endo/Heme/Allergies: Negative.     Objective:  Physical Exam  Constitutional: She is oriented to person, place, and time. She appears well-developed.  HENT:  Head: Normocephalic.  Eyes: Pupils are equal, round, and reactive to light.  Neck: Neck supple. No JVD present. No tracheal deviation present. No thyromegaly present.  Cardiovascular: Normal rate, regular rhythm, normal heart sounds and intact distal pulses.   Respiratory: Effort normal and breath sounds normal. No respiratory distress. She has no wheezes.  GI: Soft. There is no tenderness. There is no guarding.  Musculoskeletal:       Left knee: She  exhibits decreased range of motion, swelling and bony tenderness. She exhibits no ecchymosis, no deformity, no laceration and no erythema. Tenderness found.  Lymphadenopathy:    She has no cervical adenopathy.  Neurological: She is alert and oriented to person, place, and time.  Skin: Skin is warm and dry.  Psychiatric: She has a normal mood and affect.     Labs:  Estimated body mass index is 23.11 kg/m as calculated from the following:   Height as of 03/30/16: 5\' 8"  (1.727 m).   Weight as of 03/30/16: 68.9 kg (152 lb).   Imaging Review Plain radiographs demonstrate severe degenerative joint disease of the left knee(s). The overall alignment is neutral. The bone quality appears to be good for age and reported activity level.  Assessment/Plan:  End stage arthritis, left knee   The patient history, physical examination, clinical judgment of the provider and imaging studies are consistent with end stage degenerative joint disease of the left knee(s) and total knee arthroplasty is deemed medically necessary. The treatment options including medical management, injection therapy arthroscopy and arthroplasty were discussed at length. The risks and benefits of total knee arthroplasty were presented and reviewed. The risks due to aseptic loosening, infection, stiffness, patella tracking problems, thromboembolic complications and other imponderables were discussed. The patient acknowledged the explanation, agreed to proceed with the plan and consent was signed. Patient is being admitted for inpatient treatment for surgery, pain control, PT, OT, prophylactic antibiotics, VTE prophylaxis, progressive ambulation and ADL's and discharge planning. The patient is planning to be discharged home.     [place] Pugh Makayli Bracken   PA-C  04/02/2016, 9:54 PM

## 2016-04-06 ENCOUNTER — Other Ambulatory Visit: Payer: Self-pay | Admitting: Obstetrics and Gynecology

## 2016-04-06 DIAGNOSIS — R928 Other abnormal and inconclusive findings on diagnostic imaging of breast: Secondary | ICD-10-CM

## 2016-04-07 ENCOUNTER — Inpatient Hospital Stay (HOSPITAL_COMMUNITY): Payer: Medicare HMO | Admitting: Anesthesiology

## 2016-04-07 ENCOUNTER — Encounter (HOSPITAL_COMMUNITY)
Admission: RE | Disposition: A | Discharge status: Home / Self Care | Payer: Self-pay | Source: Ambulatory Visit | Attending: Orthopedic Surgery

## 2016-04-07 ENCOUNTER — Observation Stay (HOSPITAL_COMMUNITY)
Admission: RE | Admit: 2016-04-07 | Discharge: 2016-04-08 | Disposition: A | Discharge status: Home / Self Care | Payer: Medicare HMO | Source: Ambulatory Visit | Attending: Orthopedic Surgery | Admitting: Orthopedic Surgery

## 2016-04-07 ENCOUNTER — Encounter (HOSPITAL_COMMUNITY): Payer: Self-pay | Admitting: *Deleted

## 2016-04-07 DIAGNOSIS — M25462 Effusion, left knee: Secondary | ICD-10-CM | POA: Diagnosis not present

## 2016-04-07 DIAGNOSIS — M25762 Osteophyte, left knee: Secondary | ICD-10-CM | POA: Diagnosis not present

## 2016-04-07 DIAGNOSIS — F329 Major depressive disorder, single episode, unspecified: Secondary | ICD-10-CM | POA: Diagnosis not present

## 2016-04-07 DIAGNOSIS — Z90710 Acquired absence of both cervix and uterus: Secondary | ICD-10-CM | POA: Insufficient documentation

## 2016-04-07 DIAGNOSIS — F410 Panic disorder [episodic paroxysmal anxiety] without agoraphobia: Secondary | ICD-10-CM | POA: Insufficient documentation

## 2016-04-07 DIAGNOSIS — K219 Gastro-esophageal reflux disease without esophagitis: Secondary | ICD-10-CM | POA: Insufficient documentation

## 2016-04-07 DIAGNOSIS — G8918 Other acute postprocedural pain: Secondary | ICD-10-CM | POA: Diagnosis not present

## 2016-04-07 DIAGNOSIS — Z96652 Presence of left artificial knee joint: Secondary | ICD-10-CM

## 2016-04-07 DIAGNOSIS — M069 Rheumatoid arthritis, unspecified: Secondary | ICD-10-CM | POA: Diagnosis not present

## 2016-04-07 DIAGNOSIS — M65862 Other synovitis and tenosynovitis, left lower leg: Secondary | ICD-10-CM | POA: Insufficient documentation

## 2016-04-07 DIAGNOSIS — Z87891 Personal history of nicotine dependence: Secondary | ICD-10-CM | POA: Insufficient documentation

## 2016-04-07 DIAGNOSIS — Z7982 Long term (current) use of aspirin: Secondary | ICD-10-CM | POA: Diagnosis not present

## 2016-04-07 DIAGNOSIS — Z888 Allergy status to other drugs, medicaments and biological substances status: Secondary | ICD-10-CM | POA: Insufficient documentation

## 2016-04-07 DIAGNOSIS — E559 Vitamin D deficiency, unspecified: Secondary | ICD-10-CM | POA: Insufficient documentation

## 2016-04-07 DIAGNOSIS — M1712 Unilateral primary osteoarthritis, left knee: Principal | ICD-10-CM | POA: Insufficient documentation

## 2016-04-07 HISTORY — PX: TOTAL KNEE ARTHROPLASTY: SHX125

## 2016-04-07 LAB — TYPE AND SCREEN
ABO/RH(D): O POS
Antibody Screen: NEGATIVE

## 2016-04-07 SURGERY — ARTHROPLASTY, KNEE, TOTAL
Anesthesia: Spinal | Site: Knee | Laterality: Left

## 2016-04-07 MED ORDER — FENTANYL CITRATE (PF) 100 MCG/2ML IJ SOLN
INTRAMUSCULAR | Status: AC
  Filled 2016-04-07: qty 2

## 2016-04-07 MED ORDER — BUPIVACAINE HCL (PF) 0.25 % IJ SOLN
INTRAMUSCULAR | Status: AC
  Filled 2016-04-07: qty 30

## 2016-04-07 MED ORDER — CITALOPRAM HYDROBROMIDE 20 MG PO TABS
20.0000 mg | ORAL_TABLET | Freq: Every day | ORAL | Status: DC
  Administered 2016-04-08: 20 mg via ORAL
  Filled 2016-04-07: qty 1

## 2016-04-07 MED ORDER — ALUM & MAG HYDROXIDE-SIMETH 200-200-20 MG/5ML PO SUSP: 30.0000 mL | ORAL | Status: DC | PRN

## 2016-04-07 MED ORDER — MENTHOL 3 MG MT LOZG: 1.0000 | LOZENGE | OROMUCOSAL | Status: DC | PRN

## 2016-04-07 MED ORDER — LACTATED RINGERS IV SOLN
INTRAVENOUS | Status: DC
  Administered 2016-04-07 (×2): via INTRAVENOUS
  Administered 2016-04-07: 1000 mL via INTRAVENOUS

## 2016-04-07 MED ORDER — POLYETHYLENE GLYCOL 3350 17 G PO PACK
17.0000 g | PACK | Freq: Two times a day (BID) | ORAL | Status: DC
  Administered 2016-04-08: 17 g via ORAL
  Filled 2016-04-07: qty 1

## 2016-04-07 MED ORDER — CHLORHEXIDINE GLUCONATE 4 % EX LIQD: 60.0000 mL | Freq: Once | CUTANEOUS | Status: DC

## 2016-04-07 MED ORDER — PROMETHAZINE HCL 25 MG/ML IJ SOLN: 6.2500 mg | INTRAMUSCULAR | Status: DC | PRN

## 2016-04-07 MED ORDER — MIDAZOLAM HCL 2 MG/2ML IJ SOLN
INTRAMUSCULAR | Status: AC
  Filled 2016-04-07: qty 2

## 2016-04-07 MED ORDER — FERROUS SULFATE 325 (65 FE) MG PO TABS: 325.0000 mg | ORAL_TABLET | Freq: Three times a day (TID) | ORAL | Status: DC

## 2016-04-07 MED ORDER — HYDROCODONE-ACETAMINOPHEN 7.5-325 MG PO TABS
1.0000 | ORAL_TABLET | ORAL | Status: DC
  Administered 2016-04-07: 20:00:00 2 via ORAL
  Filled 2016-04-07: qty 2

## 2016-04-07 MED ORDER — ONDANSETRON HCL 4 MG/2ML IJ SOLN
INTRAMUSCULAR | Status: AC
  Filled 2016-04-07: qty 2

## 2016-04-07 MED ORDER — METHOCARBAMOL 500 MG PO TABS: 500.0000 mg | ORAL_TABLET | Freq: Four times a day (QID) | ORAL | 0 refills | Status: DC | PRN

## 2016-04-07 MED ORDER — ONDANSETRON HCL 4 MG/2ML IJ SOLN
INTRAMUSCULAR | Status: DC | PRN
  Administered 2016-04-07: 4 mg via INTRAVENOUS

## 2016-04-07 MED ORDER — METHOCARBAMOL 500 MG PO TABS
500.0000 mg | ORAL_TABLET | Freq: Four times a day (QID) | ORAL | Status: DC | PRN
  Administered 2016-04-08 (×2): 500 mg via ORAL
  Filled 2016-04-07 (×2): qty 1

## 2016-04-07 MED ORDER — BISACODYL 10 MG RE SUPP: 10.0000 mg | Freq: Every day | RECTAL | Status: DC | PRN

## 2016-04-07 MED ORDER — CEFAZOLIN SODIUM-DEXTROSE 2-4 GM/100ML-% IV SOLN
2.0000 g | Freq: Four times a day (QID) | INTRAVENOUS | Status: AC
  Administered 2016-04-07 – 2016-04-08 (×2): 2 g via INTRAVENOUS
  Filled 2016-04-07: qty 100

## 2016-04-07 MED ORDER — POLYETHYLENE GLYCOL 3350 17 G PO PACK: 17.0000 g | PACK | Freq: Two times a day (BID) | ORAL | 0 refills | Status: DC

## 2016-04-07 MED ORDER — PHENOL 1.4 % MT LIQD
1.0000 | OROMUCOSAL | Status: DC | PRN
  Filled 2016-04-07: qty 177

## 2016-04-07 MED ORDER — METOCLOPRAMIDE HCL 5 MG/ML IJ SOLN: 5.0000 mg | Freq: Three times a day (TID) | INTRAMUSCULAR | Status: DC | PRN

## 2016-04-07 MED ORDER — METOCLOPRAMIDE HCL 5 MG PO TABS: 5.0000 mg | ORAL_TABLET | Freq: Three times a day (TID) | ORAL | Status: DC | PRN

## 2016-04-07 MED ORDER — PROPOFOL 10 MG/ML IV BOLUS
INTRAVENOUS | Status: AC
  Filled 2016-04-07: qty 20

## 2016-04-07 MED ORDER — SODIUM CHLORIDE 0.9 % IR SOLN
Status: DC | PRN
  Administered 2016-04-07: 1000 mL

## 2016-04-07 MED ORDER — DEXAMETHASONE SODIUM PHOSPHATE 10 MG/ML IJ SOLN
10.0000 mg | Freq: Once | INTRAMUSCULAR | Status: AC
  Administered 2016-04-07: 10 mg via INTRAVENOUS

## 2016-04-07 MED ORDER — CEFAZOLIN SODIUM-DEXTROSE 2-4 GM/100ML-% IV SOLN
2.0000 g | INTRAVENOUS | Status: AC
  Administered 2016-04-07: 2 g via INTRAVENOUS
  Filled 2016-04-07: qty 100

## 2016-04-07 MED ORDER — ONDANSETRON HCL 4 MG PO TABS: 4.0000 mg | ORAL_TABLET | Freq: Four times a day (QID) | ORAL | Status: DC | PRN

## 2016-04-07 MED ORDER — DEXAMETHASONE SODIUM PHOSPHATE 10 MG/ML IJ SOLN
INTRAMUSCULAR | Status: AC
  Filled 2016-04-07: qty 1

## 2016-04-07 MED ORDER — BUPIVACAINE HCL (PF) 0.25 % IJ SOLN
INTRAMUSCULAR | Status: DC | PRN
  Administered 2016-04-07: 30 mL

## 2016-04-07 MED ORDER — KETOROLAC TROMETHAMINE 30 MG/ML IJ SOLN
INTRAMUSCULAR | Status: AC
  Filled 2016-04-07: qty 1

## 2016-04-07 MED ORDER — HYDROCODONE-ACETAMINOPHEN 7.5-325 MG PO TABS: 1.0000 | ORAL_TABLET | ORAL | 0 refills | Status: DC | PRN

## 2016-04-07 MED ORDER — ASPIRIN 81 MG PO CHEW
81.0000 mg | CHEWABLE_TABLET | Freq: Two times a day (BID) | ORAL | Status: DC
  Administered 2016-04-07 – 2016-04-08 (×2): 81 mg via ORAL
  Filled 2016-04-07 (×2): qty 1

## 2016-04-07 MED ORDER — DOCUSATE SODIUM 100 MG PO CAPS
100.0000 mg | ORAL_CAPSULE | Freq: Two times a day (BID) | ORAL | Status: DC
  Administered 2016-04-07 – 2016-04-08 (×2): 100 mg via ORAL
  Filled 2016-04-07 (×2): qty 1

## 2016-04-07 MED ORDER — TRANEXAMIC ACID 1000 MG/10ML IV SOLN
1000.0000 mg | INTRAVENOUS | Status: AC
  Administered 2016-04-07: 1000 mg via INTRAVENOUS
  Filled 2016-04-07: qty 1100

## 2016-04-07 MED ORDER — KETOROLAC TROMETHAMINE 30 MG/ML IJ SOLN
INTRAMUSCULAR | Status: DC | PRN
  Administered 2016-04-07: 30 mg

## 2016-04-07 MED ORDER — MAGNESIUM CITRATE PO SOLN: 1.0000 | Freq: Once | ORAL | Status: DC | PRN

## 2016-04-07 MED ORDER — FERROUS SULFATE 325 (65 FE) MG PO TABS
325.0000 mg | ORAL_TABLET | Freq: Three times a day (TID) | ORAL | Status: DC
  Administered 2016-04-08: 08:00:00 325 mg via ORAL
  Filled 2016-04-07: qty 1

## 2016-04-07 MED ORDER — DIPHENHYDRAMINE HCL 25 MG PO CAPS: 25.0000 mg | ORAL_CAPSULE | Freq: Four times a day (QID) | ORAL | Status: DC | PRN

## 2016-04-07 MED ORDER — PROPOFOL 500 MG/50ML IV EMUL
INTRAVENOUS | Status: DC | PRN
  Administered 2016-04-07: 125 ug/kg/min via INTRAVENOUS

## 2016-04-07 MED ORDER — MIDAZOLAM HCL 2 MG/2ML IJ SOLN
1.0000 mg | INTRAMUSCULAR | Status: DC | PRN
  Administered 2016-04-07: 2 mg via INTRAVENOUS
  Filled 2016-04-07: qty 2

## 2016-04-07 MED ORDER — SODIUM CHLORIDE 0.9 % IV SOLN
INTRAVENOUS | Status: DC
  Administered 2016-04-07: 20:00:00 via INTRAVENOUS

## 2016-04-07 MED ORDER — SODIUM CHLORIDE 0.9 % IJ SOLN
INTRAMUSCULAR | Status: DC | PRN
  Administered 2016-04-07: 30 mL

## 2016-04-07 MED ORDER — HYDROMORPHONE HCL 1 MG/ML IJ SOLN
0.5000 mg | INTRAMUSCULAR | Status: DC | PRN
  Administered 2016-04-07 (×2): 1 mg via INTRAVENOUS
  Administered 2016-04-07: 0.5 mg via INTRAVENOUS
  Administered 2016-04-08: 01:00:00 1 mg via INTRAVENOUS
  Filled 2016-04-07 (×4): qty 1

## 2016-04-07 MED ORDER — OXYCODONE HCL 5 MG PO TABS
5.0000 mg | ORAL_TABLET | ORAL | Status: DC | PRN
  Administered 2016-04-07 – 2016-04-08 (×5): 10 mg via ORAL
  Filled 2016-04-07 (×5): qty 2

## 2016-04-07 MED ORDER — ONDANSETRON HCL 4 MG/2ML IJ SOLN: 4.0000 mg | Freq: Four times a day (QID) | INTRAMUSCULAR | Status: DC | PRN

## 2016-04-07 MED ORDER — FENTANYL CITRATE (PF) 100 MCG/2ML IJ SOLN
INTRAMUSCULAR | Status: DC | PRN
  Administered 2016-04-07: 100 ug via INTRAVENOUS

## 2016-04-07 MED ORDER — SODIUM CHLORIDE 0.9 % IJ SOLN
INTRAMUSCULAR | Status: AC
  Filled 2016-04-07: qty 50

## 2016-04-07 MED ORDER — BUPIVACAINE IN DEXTROSE 0.75-8.25 % IT SOLN
INTRATHECAL | Status: DC | PRN
  Administered 2016-04-07: 2 mL via INTRATHECAL

## 2016-04-07 MED ORDER — PROPOFOL 10 MG/ML IV BOLUS
INTRAVENOUS | Status: DC | PRN
Start: 2016-04-07 — End: 2016-04-07
  Administered 2016-04-07: 30 mg via INTRAVENOUS

## 2016-04-07 MED ORDER — DEXAMETHASONE SODIUM PHOSPHATE 10 MG/ML IJ SOLN: 10.0000 mg | Freq: Once | INTRAMUSCULAR | Status: DC

## 2016-04-07 MED ORDER — CEFAZOLIN SODIUM-DEXTROSE 2-4 GM/100ML-% IV SOLN
INTRAVENOUS | Status: AC
  Filled 2016-04-07: qty 100

## 2016-04-07 MED ORDER — PROPOFOL 500 MG/50ML IV EMUL: INTRAVENOUS | Status: DC | PRN

## 2016-04-07 MED ORDER — HYDROMORPHONE HCL 1 MG/ML IJ SOLN: 0.2500 mg | INTRAMUSCULAR | Status: DC | PRN

## 2016-04-07 MED ORDER — PROPOFOL 10 MG/ML IV BOLUS
INTRAVENOUS | Status: AC
  Filled 2016-04-07: qty 40

## 2016-04-07 MED ORDER — ROPIVACAINE HCL 7.5 MG/ML IJ SOLN
INTRAMUSCULAR | Status: DC | PRN
  Administered 2016-04-07: 20 mL via PERINEURAL

## 2016-04-07 MED ORDER — DOCUSATE SODIUM 100 MG PO CAPS: 100.0000 mg | ORAL_CAPSULE | Freq: Two times a day (BID) | ORAL | 0 refills | Status: DC

## 2016-04-07 MED ORDER — ROPIVACAINE HCL 7.5 MG/ML IJ SOLN
INTRAMUSCULAR | Status: AC
  Filled 2016-04-07: qty 20

## 2016-04-07 MED ORDER — PHENYLEPHRINE HCL 10 MG/ML IJ SOLN
INTRAVENOUS | Status: DC | PRN
  Administered 2016-04-07: 25 ug/min via INTRAVENOUS

## 2016-04-07 MED ORDER — MIDAZOLAM HCL 5 MG/5ML IJ SOLN
INTRAMUSCULAR | Status: DC | PRN
  Administered 2016-04-07 (×2): 1 mg via INTRAVENOUS

## 2016-04-07 MED ORDER — ASPIRIN 81 MG PO CHEW: 81.0000 mg | CHEWABLE_TABLET | Freq: Two times a day (BID) | ORAL | 0 refills | Status: AC

## 2016-04-07 MED ORDER — METHOCARBAMOL 1000 MG/10ML IJ SOLN
500.0000 mg | Freq: Four times a day (QID) | INTRAVENOUS | Status: DC | PRN
  Administered 2016-04-07 (×2): 500 mg via INTRAVENOUS
  Filled 2016-04-07: qty 550
  Filled 2016-04-07 (×2): qty 5
  Filled 2016-04-07: qty 550

## 2016-04-07 SURGICAL SUPPLY — 44 items
BAG DECANTER FOR FLEXI CONT (MISCELLANEOUS) IMPLANT
BAG ZIPLOCK 12X15 (MISCELLANEOUS) IMPLANT
BANDAGE ACE 6X5 VEL STRL LF (GAUZE/BANDAGES/DRESSINGS) ×3 IMPLANT
BLADE SAW SGTL 13.0X1.19X90.0M (BLADE) ×3 IMPLANT
BOWL SMART MIX CTS (DISPOSABLE) ×3 IMPLANT
CAPT KNEE TOTAL 3 ATTUNE ×3 IMPLANT
CEMENT HV SMART SET (Cement) ×3 IMPLANT
CLOTH BEACON ORANGE TIMEOUT ST (SAFETY) ×3 IMPLANT
CUFF TOURN SGL QUICK 34 (TOURNIQUET CUFF) ×2
CUFF TRNQT CYL 34X4X40X1 (TOURNIQUET CUFF) ×1 IMPLANT
DECANTER SPIKE VIAL GLASS SM (MISCELLANEOUS) ×3 IMPLANT
DERMABOND ADVANCED (GAUZE/BANDAGES/DRESSINGS) ×2
DERMABOND ADVANCED .7 DNX12 (GAUZE/BANDAGES/DRESSINGS) ×1 IMPLANT
DRAPE U-SHAPE 47X51 STRL (DRAPES) ×3 IMPLANT
DRESSING AQUACEL AG SP 3.5X10 (GAUZE/BANDAGES/DRESSINGS) ×1 IMPLANT
DRSG AQUACEL AG SP 3.5X10 (GAUZE/BANDAGES/DRESSINGS) ×3
DURAPREP 26ML APPLICATOR (WOUND CARE) ×6 IMPLANT
ELECT REM PT RETURN 9FT ADLT (ELECTROSURGICAL) ×3
ELECTRODE REM PT RTRN 9FT ADLT (ELECTROSURGICAL) ×1 IMPLANT
GLOVE BIOGEL M 7.0 STRL (GLOVE) IMPLANT
GLOVE BIOGEL PI IND STRL 7.5 (GLOVE) ×5 IMPLANT
GLOVE BIOGEL PI IND STRL 8.5 (GLOVE) ×1 IMPLANT
GLOVE BIOGEL PI INDICATOR 7.5 (GLOVE) ×10
GLOVE BIOGEL PI INDICATOR 8.5 (GLOVE) ×2
GLOVE ECLIPSE 8.0 STRL XLNG CF (GLOVE) ×6 IMPLANT
GLOVE ORTHO TXT STRL SZ7.5 (GLOVE) ×3 IMPLANT
GOWN STRL REUS W/TWL LRG LVL3 (GOWN DISPOSABLE) ×3 IMPLANT
GOWN STRL REUS W/TWL XL LVL3 (GOWN DISPOSABLE) ×9 IMPLANT
HANDPIECE INTERPULSE COAX TIP (DISPOSABLE) ×2
MANIFOLD NEPTUNE II (INSTRUMENTS) ×3 IMPLANT
PACK TOTAL KNEE CUSTOM (KITS) ×3 IMPLANT
POSITIONER SURGICAL ARM (MISCELLANEOUS) ×3 IMPLANT
SET HNDPC FAN SPRY TIP SCT (DISPOSABLE) ×1 IMPLANT
SET PAD KNEE POSITIONER (MISCELLANEOUS) ×3 IMPLANT
SUT MNCRL AB 4-0 PS2 18 (SUTURE) ×3 IMPLANT
SUT VIC AB 1 CT1 36 (SUTURE) ×3 IMPLANT
SUT VIC AB 2-0 CT1 27 (SUTURE) ×6
SUT VIC AB 2-0 CT1 TAPERPNT 27 (SUTURE) ×3 IMPLANT
SUT VLOC 180 0 24IN GS25 (SUTURE) ×3 IMPLANT
SYR 50ML LL SCALE MARK (SYRINGE) ×3 IMPLANT
TRAY FOLEY CATH 14FR (SET/KITS/TRAYS/PACK) ×3 IMPLANT
WATER STERILE IRR 1500ML POUR (IV SOLUTION) ×3 IMPLANT
WRAP KNEE MAXI GEL POST OP (GAUZE/BANDAGES/DRESSINGS) ×3 IMPLANT
YANKAUER SUCT BULB TIP 10FT TU (MISCELLANEOUS) ×3 IMPLANT

## 2016-04-07 NOTE — Progress Notes (Signed)
Jackie Bissel PA called reg patient's c/o of severe pain despite following prescribed regimen. Order received to switch from norco to oxycodone for pain. Will continue to monitor patient

## 2016-04-07 NOTE — Interval H&P Note (Signed)
History and Physical Interval Note:  04/07/2016 1:52 PM  [name] [name]  has presented today for surgery, with the diagnosis of Left knee osteoarthritis  The various methods of treatment have been discussed with the patient and family. After consideration of risks, benefits and other options for treatment, the patient has consented to  Procedure(s): LEFT  TOTAL KNEE ARTHROPLASTY (Left) as a surgical intervention .  The patient's history has been reviewed, patient examined, no change in status, stable for surgery.  I have reviewed the patient's chart and labs.  Questions were answered to the patient's satisfaction.     Mauri Pole

## 2016-04-07 NOTE — Op Note (Signed)
NAME:  [name] [name] RECORD NO.:  [id number]                             FACILITY:  [facility]      PHYSICIAN:  Pietro Cassis. Alvan Dame, M.D.  DATE OF BIRTH:  October 06, 1945      DATE OF PROCEDURE:  04/07/2016                                     OPERATIVE REPORT         PREOPERATIVE DIAGNOSIS:  Left knee osteoarthritis.      POSTOPERATIVE DIAGNOSIS:  Left knee osteoarthritis.      FINDINGS:  The patient was noted to have complete loss of cartilage and   bone-on-bone arthritis with associated osteophytes in the medial and patellofemoral compartments of   the knee with a significant synovitis and associated effusion.      PROCEDURE:  Left total knee replacement.      COMPONENTS USED:  DePuy Attune rotating platform posterior stabilized knee   system, a size 6 narrow femur, 4 tibia, size 6 PS AOX insert, and 32 anatomic patellar   button.      SURGEON:  Pietro Cassis. Alvan Dame, M.D.      ASSISTANT:  Danae Orleans, PA-C.      ANESTHESIA:  Regional and Spinal.      SPECIMENS:  None.      COMPLICATION:  None.      DRAINS:  None.  EBL: <150cc      TOURNIQUET TIME:   26 min at 250 mmHg      The patient was stable to the recovery room.      INDICATION FOR PROCEDURE:  [name] [name] is a 71 y.o. female patient of   mine.  The patient had been seen, evaluated, and treated conservatively in the   office with medication, activity modification, and injections.  The patient had   radiographic changes of bone-on-bone arthritis with endplate sclerosis and osteophytes noted.      The patient failed conservative measures including medication, injections, and activity modification, and at this point was ready for more definitive measures.   Based on the radiographic changes and failed conservative measures, the patient   decided to proceed with total knee replacement.  Risks of infection,   DVT, component failure, need for revision surgery, postop course, and   expectations were all   discussed and reviewed.  Consent was obtained for benefit of pain   relief.      PROCEDURE IN DETAIL:  The patient was brought to the operative theater.   Once adequate anesthesia, preoperative antibiotics, 2 gm of Ancef, 1 gm of Tranexamic Acid, and 10 mg of Decadron administered, the patient was positioned supine with the left thigh tourniquet placed.  The  left lower extremity was prepped and draped in sterile fashion.  A time-   out was performed identifying the patient, planned procedure, and   extremity.      The left lower extremity was placed in the [facility] leg holder.  The leg was   exsanguinated, tourniquet elevated to 250 mmHg.  A midline incision was   made followed by median parapatellar arthrotomy.  Following initial   exposure,  attention was first directed to the patella.  Precut   measurement was noted to be 20 mm.  I resected down to 13 mm and used a   32 anatomic patellar button to restore patellar height as well as cover the cut   surface.      The lug holes were drilled and a metal shim was placed to protect the   patella from retractors and saw blades.      At this point, attention was now directed to the femur.  The femoral   canal was opened with a drill, irrigated to try to prevent fat emboli.  An   intramedullary rod was passed at 3 degrees valgus, 9 mm of bone was   resected off the distal femur.  Following this resection, the tibia was   subluxated anteriorly.  Using the extramedullary guide, 2 mm of bone was resected off   the proximal medial tibia.  We confirmed the gap would be   stable medially and laterally with a size 5 spacer block as well as confirmed   the cut was perpendicular in the coronal plane, checking with an alignment rod.      Once this was done, I sized the femur to be a size 6 in the anterior-   posterior dimension, chose a narrow component based on medial and   lateral dimension.  The size 6 rotation block was then  pinned in   position anterior referenced using the C-clamp to set rotation.  The   anterior, posterior, and  chamfer cuts were made without difficulty nor   notching making certain that I was along the anterior cortex to help   with flexion gap stability.      The final box cut was made off the lateral aspect of distal femur.      At this point, the tibia was sized to be a size 4, the size 4 tray was   then pinned in position through the medial third of the tubercle,   drilled, and keel punched.  Trial reduction was now carried with a 6 femur,  4 tibia, a size 6  PS insert, and the 32 anatomic patella botton.  The knee was brought to   extension, full extension with good flexion stability with the patella   tracking through the trochlea without application of pressure.  Given   all these findings the femoral lug holes were drilled and then the trial components removed.  Final components were   opened and cement was mixed.  The knee was irrigated with normal saline   solution and pulse lavage.  The synovial lining was   then injected with 20 cc of 0.25% Marcaine without epinephrine and 1 cc of Toradol plus 30 cc of NS for a total of 61 cc.      The knee was irrigated.  Final implants were then cemented onto clean and   dried cut surfaces of bone with the knee brought to extension with a size 6 PS trial insert.      Once the cement had fully cured, the excess cement was removed   throughout the knee.  I confirmed I was satisfied with the range of   motion and stability, and the final size 6 PS AOX insert was chosen.  It was   placed into the knee.      The tourniquet had been let down at 26 minutes.  No significant   hemostasis required.  The   extensor  mechanism was then reapproximated using #1 Vicryl and #0 V-lock sutures with the knee   in flexion.  The   remaining wound was closed with 2-0 Vicryl and running 4-0 Monocryl.   The knee was cleaned, dried, dressed sterilely using  Dermabond and   Aquacel dressing.  The patient was then   brought to recovery room in stable condition, tolerating the procedure   well.   Please note that Physician Assistant, Danae Orleans, PA-C, was present for the entirety of the case, and was utilized for pre-operative positioning, peri-operative retractor management, general facilitation of the procedure.  He was also utilized for primary wound closure at the end of the case.              Pietro Cassis Alvan Dame, M.D.    04/07/2016 3:19 PM

## 2016-04-07 NOTE — Discharge Instructions (Signed)

## 2016-04-07 NOTE — Anesthesia Preprocedure Evaluation (Addendum)
Anesthesia Evaluation  Patient identified by MRN, date of birth, ID band Patient awake    Reviewed: Allergy & Precautions, NPO status , Patient's Chart, lab work & pertinent test results  Airway Mallampati: II  TM Distance: >3 FB Neck ROM: Full    Dental no notable dental hx.    Pulmonary neg pulmonary ROS, former smoker,    Pulmonary exam normal breath sounds clear to auscultation       Cardiovascular negative cardio ROS Normal cardiovascular exam Rhythm:Regular Rate:Normal     Neuro/Psych negative neurological ROS  negative psych ROS   GI/Hepatic negative GI ROS, Neg liver ROS,   Endo/Other  negative endocrine ROS  Renal/GU negative Renal ROS  negative genitourinary   Musculoskeletal  (+) Arthritis , Rheumatoid disorders,    Abdominal   Peds negative pediatric ROS (+)  Hematology negative hematology ROS (+)   Anesthesia Other Findings   Reproductive/Obstetrics negative OB ROS                             Anesthesia Physical Anesthesia Plan  ASA: II  Anesthesia Plan: Spinal   Post-op Pain Management:    Induction: Intravenous  Airway Management Planned: Simple Face Mask  Additional Equipment:   Intra-op Plan:   Post-operative Plan:   Informed Consent: I have reviewed the patients History and Physical, chart, labs and discussed the procedure including the risks, benefits and alternatives for the proposed anesthesia with the patient or authorized representative who has indicated his/her understanding and acceptance.   Dental advisory given  Plan Discussed with: CRNA and Surgeon  Anesthesia Plan Comments:         Anesthesia Quick Evaluation

## 2016-04-07 NOTE — Progress Notes (Signed)
Assisted Dr. Rose with left, ultrasound guided, adductor canal block. Side rails up, monitors on throughout procedure. See vital signs in flow sheet. Tolerated Procedure well.  

## 2016-04-07 NOTE — Anesthesia Procedure Notes (Signed)
Spinal  Patient location during procedure: OR End time: 04/07/2016 3:06 PM Staffing Resident/CRNA: Enrigue Catena E Performed: anesthesiologist  Preanesthetic Checklist Completed: patient identified, site marked, surgical consent, pre-op evaluation, timeout performed, IV checked, risks and benefits discussed and monitors and equipment checked Spinal Block Patient position: sitting Prep: DuraPrep Patient monitoring: heart rate, continuous pulse ox and blood pressure Location: L3-4 Injection technique: single-shot Needle Needle type: Sprotte and Pencan  Needle gauge: 24 G Needle length: 9 cm Assessment Sensory level: T6 Additional Notes Expiration date of kit checked and confirmed. Patient tolerated procedure well, without complications.

## 2016-04-07 NOTE — Transfer of Care (Signed)
Immediate Anesthesia Transfer of Care Note  Patient: [name] [name]  Procedure(s) Performed: Procedure(s): LEFT  TOTAL KNEE ARTHROPLASTY (Left)  Patient Location: PACU  Anesthesia Type:Spinal  Level of Consciousness: awake, alert , oriented and patient cooperative  Airway & Oxygen Therapy: Patient Spontanous Breathing and Patient connected to face mask oxygen  Post-op Assessment: Report given to RN and Post -op Vital signs reviewed and stable  Post vital signs: stable  Last Vitals:  Vitals:   04/07/16 1433 04/07/16 1434  BP:    Pulse:    Resp: 18 16  Temp:      Last Pain:  Vitals:   04/07/16 1334  TempSrc: Oral      Patients Stated Pain Goal: 4 ([id number])  Complications: No apparent anesthesia complications

## 2016-04-07 NOTE — Anesthesia Procedure Notes (Signed)
Anesthesia Regional Block: Adductor canal block   Pre-Anesthetic Checklist: ,, timeout performed, Correct Patient, Correct Site, Correct Laterality, Correct Procedure, Correct Position, site marked, Risks and benefits discussed,  Surgical consent,  Pre-op evaluation,  At surgeon's request and post-op pain management  Laterality: Left  Prep: chloraprep       Needles:  Injection technique: Single-shot  Needle Type: Echogenic Needle     Needle Length: 9cm      Additional Needles:   Procedures: ultrasound guided,,,,,,,,  Narrative:  Start time: 04/07/2016 2:10 PM End time: 04/07/2016 2:20 PM Injection made incrementally with aspirations every 5 mL.  Performed by: Personally  Anesthesiologist: Fedora Knisely  Additional Notes: Patient tolerated the procedure well without complications

## 2016-04-08 ENCOUNTER — Encounter (HOSPITAL_COMMUNITY): Payer: Self-pay | Admitting: Orthopedic Surgery

## 2016-04-08 DIAGNOSIS — F329 Major depressive disorder, single episode, unspecified: Secondary | ICD-10-CM | POA: Diagnosis not present

## 2016-04-08 DIAGNOSIS — E559 Vitamin D deficiency, unspecified: Secondary | ICD-10-CM | POA: Diagnosis not present

## 2016-04-08 DIAGNOSIS — M069 Rheumatoid arthritis, unspecified: Secondary | ICD-10-CM | POA: Diagnosis not present

## 2016-04-08 DIAGNOSIS — M1712 Unilateral primary osteoarthritis, left knee: Secondary | ICD-10-CM | POA: Diagnosis not present

## 2016-04-08 DIAGNOSIS — F410 Panic disorder [episodic paroxysmal anxiety] without agoraphobia: Secondary | ICD-10-CM | POA: Diagnosis not present

## 2016-04-08 DIAGNOSIS — M25762 Osteophyte, left knee: Secondary | ICD-10-CM | POA: Diagnosis not present

## 2016-04-08 DIAGNOSIS — K219 Gastro-esophageal reflux disease without esophagitis: Secondary | ICD-10-CM | POA: Diagnosis not present

## 2016-04-08 DIAGNOSIS — M65862 Other synovitis and tenosynovitis, left lower leg: Secondary | ICD-10-CM | POA: Diagnosis not present

## 2016-04-08 DIAGNOSIS — M25462 Effusion, left knee: Secondary | ICD-10-CM | POA: Diagnosis not present

## 2016-04-08 DIAGNOSIS — Z96652 Presence of left artificial knee joint: Secondary | ICD-10-CM | POA: Diagnosis not present

## 2016-04-08 LAB — CBC
HCT: 29.9 % — ABNORMAL LOW (ref 36.0–46.0)
Hemoglobin: 9.9 g/dL — ABNORMAL LOW (ref 12.0–15.0)
MCH: 30.5 pg (ref 26.0–34.0)
MCHC: 33.1 g/dL (ref 30.0–36.0)
MCV: 92 fL (ref 78.0–100.0)
PLATELETS: 191 10*3/uL (ref 150–400)
RBC: 3.25 MIL/uL — ABNORMAL LOW (ref 3.87–5.11)
RDW: 13 % (ref 11.5–15.5)
WBC: 9.1 10*3/uL (ref 4.0–10.5)

## 2016-04-08 LAB — BASIC METABOLIC PANEL
ANION GAP: 5 (ref 5–15)
BUN: 13 mg/dL (ref 6–20)
CALCIUM: 8.7 mg/dL — AB (ref 8.9–10.3)
CO2: 27 mmol/L (ref 22–32)
CREATININE: 0.64 mg/dL (ref 0.44–1.00)
Chloride: 104 mmol/L (ref 101–111)
GFR calc Af Amer: >60 mL/min (ref >60)
GLUCOSE: 196 mg/dL — AB (ref 65–99)
Potassium: 4.1 mmol/L (ref 3.5–5.1)
Sodium: 136 mmol/L (ref 135–145)

## 2016-04-08 MED ORDER — OXYCODONE HCL 5 MG PO TABS: 5.0000 mg | ORAL_TABLET | ORAL | 0 refills | Status: DC | PRN

## 2016-04-08 NOTE — Evaluation (Signed)
Occupational Therapy Evaluation Patient Details Name: [name] [name] MRN: [id number] DOB: Jul 08, 1945 Today's Date: 04/08/2016    History of Present Illness s/p L TKA   Clinical Impression   OT education complete regarding ADL activity s/p TKA    Follow Up Recommendations       Equipment Recommendations          Precautions / Restrictions Precautions Precautions: Fall;Knee Precaution Comments: reviewed positioning at length with pt Restrictions Weight Bearing Restrictions: No Other Position/Activity Restrictions: WBAT      Mobility Bed Mobility Overal bed mobility: Needs Assistance Bed Mobility: Supine to Sit     Supine to sit: Supervision     General bed mobility comments: cues to self assist  Transfers Overall transfer level: Needs assistance Equipment used: Rolling walker (2 wheeled) Transfers: Sit to/from Stand Sit to Stand: Supervision;Min guard Stand pivot transfers: Min guard       General transfer comment: cues for safety and hand placment         ADL Overall ADL's : Needs assistance/impaired Eating/Feeding: Set up;Sitting   Grooming: Set up;Sitting   Upper Body Bathing: Set up;Sitting   Lower Body Bathing: Set up;Sit to/from stand;Cueing for compensatory techniques;Cueing for sequencing;Cueing for safety   Upper Body Dressing : Set up;Sitting   Lower Body Dressing: Min guard;Sit to/from stand;Cueing for safety;Cueing for compensatory techniques;Cueing for sequencing   Toilet Transfer: Min guard;RW;Ambulation;Cueing for sequencing;Cueing for safety   Toileting- Clothing Manipulation and Hygiene: Min guard;Sit to/from stand;Cueing for safety;Cueing for sequencing;Cueing for compensatory techniques   Tub/ Shower Transfer: Copy Details (indicate cue type and reason): verbalized safety Functional mobility during ADLs: Cueing for sequencing;Cueing for safety;Rolling walker       Vision Patient Visual  Report: No change from baseline              Pertinent Vitals/Pain Pain Assessment: 0-10 Pain Score: 6  Pain Location: L knee Pain Descriptors / Indicators: Aching;Sore Pain Intervention(s): Monitored during session;Premedicated before session;Repositioned;Ice applied;Limited activity within patient's tolerance     Hand Dominance     Extremity/Trunk Assessment Upper Extremity Assessment Upper Extremity Assessment: Overall WFL for tasks assessed   Lower Extremity Assessment Lower Extremity Assessment: LLE deficits/detail LLE Deficits / Details: ankle WFL, knee extension and hip flexion 3/5; AAROM knee flexion ~8-65*       Communication Communication Communication: No difficulties   Cognition Arousal/Alertness: Awake/alert Behavior During Therapy: WFL for tasks assessed/performed Overall Cognitive Status: Within Functional Limits for tasks assessed                     General Comments       Exercises       Shoulder Instructions      Home Living Family/patient expects to be discharged to:: Private residence Living Arrangements: Spouse/significant other Available Help at Discharge: Family Type of Home: House Home Access: Stairs to enter Technical brewer of Steps: 2 Entrance Stairs-Rails: None Home Layout: Two level Alternate Level Stairs-Number of Steps: flight  Alternate Level Stairs-Rails: Right Bathroom Shower/Tub: Walk-in shower         Home Equipment: None          Prior Functioning/Environment Level of Independence: Independent                          OT Goals(Current goals can be found in the care plan section) Acute Rehab OT Goals Patient Stated Goal: hometoday OT Goal Formulation: With  patient  OT Frequency:                End of Session Equipment Utilized During Treatment: Rolling walker Nurse Communication: Mobility status  Activity Tolerance: Patient tolerated treatment well Patient left: in bed;Other  (comment) (sitting edge of bed- RN aware)  OT Visit Diagnosis: Pain                ADL either performed or assessed with clinical judgement  Time: [id number] OT Time Calculation (min): 24 min Charges:  OT General Charges $OT Visit: 1 Procedure OT Evaluation $OT Eval Moderate Complexity: 1 Procedure OT Treatments $Self Care/Home Management : 8-22 mins G-Codes:       Payton Mccallum D 04/08/2016, 12:42 PM

## 2016-04-08 NOTE — Progress Notes (Signed)
Physical Therapy Treatment Patient Details Name: [name] [name] MRN: [id number] DOB: [birth year] Today's Date: 04/08/2016    History of Present Illness s/p L TKA    PT Comments    POD # 1 pm session Arrived to room around 1:15 pm and pt asked I return after she gets more pain meds.  So I told her and spouse who was in room at time, I would return at 2 pm to practice stairs. When I arrived shortly after 2pm, spouse was not present.  Did practice a flight of stairs with pt using one rail and one crutch.  Handout also given.  Instructed on proper sequencing, proper use of a crutch and safety.   Preformed well and ready to D/C to home.   Follow Up Recommendations  Home health PT     Equipment Recommendations  Rolling walker with 5" wheels;3in1 (PT)    Recommendations for Other Services       Precautions / Restrictions Precautions Precautions: Fall;Knee Precaution Comments: reviewed positioning at length with pt Restrictions Weight Bearing Restrictions: No Other Position/Activity Restrictions: WBAT    Mobility  Bed Mobility Overal bed mobility: Needs Assistance Bed Mobility: Supine to Sit;Sit to Supine     Supine to sit: Supervision Sit to supine: Supervision   General bed mobility comments: cues to self assist and increased time  Transfers Overall transfer level: Needs assistance Equipment used: Rolling walker (2 wheeled) Transfers: Sit to/from Stand Sit to Stand: Supervision;Min guard Stand pivot transfers: Min guard       General transfer comment: 50% cues for safety and hand placment.  Pt easily distracted and slight anxiety  Ambulation/Gait Ambulation/Gait assistance: Supervision;Min guard Ambulation Distance (Feet): 11 Feet Assistive device: Rolling walker (2 wheeled) Gait Pattern/deviations: Step-to pattern;Step-through pattern;Trunk flexed Gait velocity: decreased   General Gait Details: 50% cues for RW position, safety with turns.  Decreased  amb distance as Tx session focus on stairs.     Stairs Stairs: Yes   Stair Management: One rail Left;With crutches Number of Stairs: 12 General stair comments: 50% VC's on proper sequencing, crutch use and safety.  Pt's bedroom is on second floor.    Wheelchair Mobility    Modified Rankin (Stroke Patients Only)       Balance                                    Cognition Arousal/Alertness: Awake/alert Behavior During Therapy: WFL for tasks assessed/performed Overall Cognitive Status: Within Functional Limits for tasks assessed                      Exercises      General Comments        Pertinent Vitals/Pain Pain Assessment: 0-10 Pain Score: 8  Pain Location: L knee Pain Descriptors / Indicators: Moaning;Discomfort;Grimacing;Operative site guarding;Tender Pain Intervention(s): Monitored during session;Repositioned;Ice applied    Home Living Family/patient expects to be discharged to:: Private residence Living Arrangements: Spouse/significant other Available Help at Discharge: Family Type of Home: House Home Access: Stairs to enter Entrance Stairs-Rails: None Home Layout: Two level Home Equipment: None      Prior Function Level of Independence: Independent          PT Goals (current goals can now be found in the care plan section) Acute Rehab PT Goals Patient Stated Goal: hometoday Progress towards PT goals: Progressing toward goals    Frequency  7X/week      PT Plan Current plan remains appropriate    Co-evaluation             End of Session Equipment Utilized During Treatment: Gait belt Activity Tolerance: Patient tolerated treatment well Patient left: with call bell/phone within reach;in bed Nurse Communication:  (pt ready for D/C to home) PT Visit Diagnosis: Unsteadiness on feet (R26.81)     Time: 1411-1435 PT Time Calculation (min) (ACUTE ONLY): 24 min  Charges:  $Gait Training: 8-22 mins $Therapeutic  Activity: 8-22 mins                    G Codes:       Rica Koyanagi  PTA [facility]  Acute  Rehab Pager      [phone]

## 2016-04-08 NOTE — Progress Notes (Signed)
     Subjective: 1 Day Post-Op Procedure(s) (LRB): LEFT  TOTAL KNEE ARTHROPLASTY (Left)   Patient reports pain as mild, pain controlled. No events throughout the night.  Feels that she got behind the the pain last night, but was given medication within 20-30 minutes of getting to the floor.She feels that she had a rough night because of the pain medication, which was changed last night to help the patient.  She feels that she is doing better this morning.  Ready to be discharged home if she does well with PT.  Objective:   VITALS:   Vitals:   04/08/16 0103 04/08/16 0508  BP: 122/61 132/63  Pulse: 68 74  Resp: 16   Temp: 97.5 F (36.4 C) 98 F (36.7 C)    Dorsiflexion/Plantar flexion intact Incision: dressing C/D/I No cellulitis present Compartment soft  LABS  Recent Labs  04/08/16 0446  HGB 9.9*  HCT 29.9*  WBC 9.1  PLT 191     Recent Labs  04/08/16 0446  NA 136  K 4.1  BUN 13  CREATININE 0.64  GLUCOSE 196*     Assessment/Plan: 1 Day Post-Op Procedure(s) (LRB): LEFT  TOTAL KNEE ARTHROPLASTY (Left) Foley cath d/c'ed Advance diet Up with therapy D/C IV fluids Discharge home with home health Follow up in 2 weeks at [facility]. Follow up with OLIN,Bennet Kujawa D in 2 weeks.  Contact information:  [facility] [address] [place] Karen Kinnard   PAC  04/08/2016, 9:51 AM

## 2016-04-08 NOTE — Anesthesia Postprocedure Evaluation (Addendum)
Anesthesia Post Note  Patient: [name] [name]  Procedure(s) Performed: Procedure(s) (LRB): LEFT  TOTAL KNEE ARTHROPLASTY (Left)  Patient location during evaluation: PACU Anesthesia Type: Spinal Level of consciousness: awake and alert Pain management: pain level controlled Vital Signs Assessment: post-procedure vital signs reviewed and stable Respiratory status: spontaneous breathing, nonlabored ventilation, respiratory function stable and patient connected to nasal cannula oxygen Cardiovascular status: blood pressure returned to baseline and stable Postop Assessment: no signs of nausea or vomiting Anesthetic complications: no       Last Vitals:  Vitals:   04/07/16 2129 04/08/16 0103  BP: 122/63 122/61  Pulse: 63 68  Resp: 16 16  Temp: 37.3 C 36.4 C    Last Pain:  Vitals:   04/08/16 0455  TempSrc:   PainSc: Asleep                 [name]

## 2016-04-08 NOTE — Evaluation (Signed)
Physical Therapy Evaluation Patient Details Name: [name] [name] MRN: [id number] DOB: 1945-10-28 Today's Date: 04/08/2016   History of Present Illness  s/p L TKA  Clinical Impression  Pt admitted with above diagnosis. Pt currently with functional limitations due to the deficits listed below (see PT Problem List).  Pt will benefit from skilled PT to increase their independence and safety with mobility to allow discharge to the venue listed below.   Will see for a second session today for stair training    Follow Up Recommendations Home health PT    Equipment Recommendations  Rolling walker with 5" wheels;3in1 (PT) (already delivered)    Recommendations for Other Services       Precautions / Restrictions Precautions Precautions: Fall;Knee Precaution Comments: reviewed positioning at length with pt Restrictions Weight Bearing Restrictions: No Other Position/Activity Restrictions: WBAT      Mobility  Bed Mobility Overal bed mobility: Needs Assistance Bed Mobility: Supine to Sit     Supine to sit: Supervision     General bed mobility comments: cues to self assist  Transfers Overall transfer level: Needs assistance Equipment used: Rolling walker (2 wheeled) Transfers: Stand Pivot Transfers;Sit to/from Stand Sit to Stand: Supervision;Min guard Stand pivot transfers: Min guard       General transfer comment: cues for safety and hand placment  Ambulation/Gait Ambulation/Gait assistance: Supervision;Min guard Ambulation Distance (Feet): 100 Feet Assistive device: Rolling walker (2 wheeled) Gait Pattern/deviations: Step-to pattern;Step-through pattern;Trunk flexed     General Gait Details: cues for RW position, useof UEs for pain control with WBing LLE  Stairs            Wheelchair Mobility    Modified Rankin (Stroke Patients Only)       Balance                                             Pertinent Vitals/Pain Pain  Assessment: 0-10 Pain Score: 4  Pain Location: L knee Pain Descriptors / Indicators: Aching;Sore Pain Intervention(s): Limited activity within patient's tolerance;Monitored during session;Premedicated before session;Repositioned;Ice applied    Home Living Family/patient expects to be discharged to:: Private residence Living Arrangements: Spouse/significant other   Type of Home: House Home Access: Stairs to enter Entrance Stairs-Rails: None Entrance Stairs-Number of Steps: 2 Home Layout: Two level Home Equipment: None      Prior Function Level of Independence: Independent               Hand Dominance        Extremity/Trunk Assessment   Upper Extremity Assessment Upper Extremity Assessment: Overall WFL for tasks assessed;Defer to OT evaluation    Lower Extremity Assessment Lower Extremity Assessment: LLE deficits/detail LLE Deficits / Details: ankle WFL, knee extension and hip flexion 3/5; AAROM knee flexion ~8-65*       Communication   Communication: No difficulties  Cognition Arousal/Alertness: Awake/alert Behavior During Therapy: WFL for tasks assessed/performed Overall Cognitive Status: Within Functional Limits for tasks assessed                      General Comments      Exercises Total Joint Exercises Ankle Circles/Pumps: AROM;Both;10 reps Quad Sets: 10 reps;Both;AROM Heel Slides: AAROM;Left;10 reps Hip ABduction/ADduction: AROM;Left;10 reps Straight Leg Raises: AAROM;AROM;Left;10 reps   Assessment/Plan    PT Assessment Patient needs continued PT services  PT Problem List Decreased strength;Decreased  range of motion;Pain;Decreased mobility;Decreased knowledge of precautions;Decreased knowledge of use of DME       PT Treatment Interventions DME instruction;Gait training;Stair training;Functional mobility training;Therapeutic activities;Therapeutic exercise;Patient/family education    PT Goals (Current goals can be found in the Care Plan  section)  Acute Rehab PT Goals PT Goal Formulation: With patient Time For Goal Achievement: 04/10/16 Potential to Achieve Goals: Good    Frequency 7X/week   Barriers to discharge        Co-evaluation               End of Session Equipment Utilized During Treatment: Gait belt Activity Tolerance: Patient tolerated treatment well Patient left: in chair;with call bell/phone within reach;with family/visitor present   PT Visit Diagnosis: Unsteadiness on feet (R26.81)         Time: [id number] PT Time Calculation (min) (ACUTE ONLY): 36 min   Charges:   PT Evaluation $PT Eval Low Complexity: 1 Procedure PT Treatments $Gait Training: 8-22 mins   PT G Codes:         Sally-Ann Cutbirth 2016-04-15, 10:55 AM

## 2016-04-08 NOTE — Care Management Note (Signed)
Case Management Note  Patient Details  Name: [name] [name] MRN: [id number] Date of Birth: 10/10/1945  Subjective/Objective:                  LEFT  TOTAL KNEE ARTHROPLASTY (Left) Action/Plan: Discharge planning Expected Discharge Date:                  Expected Discharge Plan:  [place]  In-House Referral:     Discharge planning Services  CM Consult  Post Acute Care Choice:  Home Health Choice offered to:  Patient  DME Arranged:  3-N-1, Walker rolling DME Agency:  [place]:  PT [place] Agency:  [facility] (formerly [facility])  Status of Service:     If discussed at [employer], dates discussed:    Additional Comments: CM met with pt in room to offer choice of home health agency. Pt chooses [facility] to render HHPT. Referral given to [facility] rep, Tim. Cm notified [place] DME rep, Joelene Millin to please deliver the rolling walker and 3n1 to room prior to discharge. No other CM needs were communicated. Dellie Catholic, RN 04/08/2016, 8:53 AM

## 2016-04-09 DIAGNOSIS — M069 Rheumatoid arthritis, unspecified: Secondary | ICD-10-CM | POA: Diagnosis not present

## 2016-04-09 DIAGNOSIS — Z471 Aftercare following joint replacement surgery: Secondary | ICD-10-CM | POA: Diagnosis not present

## 2016-04-09 DIAGNOSIS — F329 Major depressive disorder, single episode, unspecified: Secondary | ICD-10-CM | POA: Diagnosis not present

## 2016-04-09 DIAGNOSIS — F410 Panic disorder [episodic paroxysmal anxiety] without agoraphobia: Secondary | ICD-10-CM | POA: Diagnosis not present

## 2016-04-09 DIAGNOSIS — F1010 Alcohol abuse, uncomplicated: Secondary | ICD-10-CM | POA: Diagnosis not present

## 2016-04-09 DIAGNOSIS — M1990 Unspecified osteoarthritis, unspecified site: Secondary | ICD-10-CM | POA: Diagnosis not present

## 2016-04-10 DIAGNOSIS — F329 Major depressive disorder, single episode, unspecified: Secondary | ICD-10-CM | POA: Diagnosis not present

## 2016-04-10 DIAGNOSIS — F1010 Alcohol abuse, uncomplicated: Secondary | ICD-10-CM | POA: Diagnosis not present

## 2016-04-10 DIAGNOSIS — F410 Panic disorder [episodic paroxysmal anxiety] without agoraphobia: Secondary | ICD-10-CM | POA: Diagnosis not present

## 2016-04-10 DIAGNOSIS — M069 Rheumatoid arthritis, unspecified: Secondary | ICD-10-CM | POA: Diagnosis not present

## 2016-04-10 DIAGNOSIS — M1990 Unspecified osteoarthritis, unspecified site: Secondary | ICD-10-CM | POA: Diagnosis not present

## 2016-04-10 DIAGNOSIS — Z471 Aftercare following joint replacement surgery: Secondary | ICD-10-CM | POA: Diagnosis not present

## 2016-04-10 NOTE — Discharge Summary (Signed)
Physician Discharge Summary  Patient ID: [name] [name] MRN: [id number] DOB/AGE: 1945-11-12 71 y.o.  Admit date: 04/07/2016 Discharge date: 04/08/2016   Procedures:  Procedure(s) (LRB): LEFT  TOTAL KNEE ARTHROPLASTY (Left)  Attending Physician:  Dr. Paralee Cancel   Admission Diagnoses:   Left knee primary OA / pain  Discharge Diagnoses:  Active Problems:   Status post total left knee replacement  Past Medical History:  Diagnosis Date  . Anxiety disorder   . Arthritis   . Depression   . Flu    01/2017   . GERD (gastroesophageal reflux disease)   . Panic attack    panic attacks   . RA (rheumatoid arthritis) ([place])   . Urinary tract infection    hx of 02/2016   . Vitamin D deficiency     HPI:    [name] [name], 71 y.o. female, has a history of pain and functional disability in the left knee due to arthritis and has failed non-surgical conservative treatments for greater than 12 weeks to include NSAID's and/or analgesics, corticosteriod injections, viscosupplementation injections and activity modification.  Onset of symptoms was gradual, starting 5+ years ago with gradually worsening course since that time. The patient noted no past surgery on the left knee(s).  Patient currently rates pain in the left knee(s) at 10 out of 10 with activity. Patient has worsening of pain with activity and weight bearing, pain that interferes with activities of daily living, pain with passive range of motion, crepitus and joint swelling.  Patient has evidence of periarticular osteophytes and joint space narrowing by imaging studies. There is no active infection.  Risks, benefits and expectations were discussed with the patient.  Risks including but not limited to the risk of anesthesia, blood clots, nerve damage, blood vessel damage, failure of the prosthesis, infection and up to and including death.  Patient understand the risks, benefits and expectations and wishes to proceed with  surgery.  PCP: Merrilee Seashore, MD   Discharged Condition: good  Hospital Course:  Patient underwent the above stated procedure on 04/07/2016. Patient tolerated the procedure well and brought to the recovery room in good condition and subsequently to the floor.  POD #1 BP: 132/63 ; Pulse: 74 ; Temp: 98 F (36.7 C) ; Resp: 16 Patient reports pain as mild, pain controlled. No events throughout the night.  Feels that she got behind the the pain last night, but was given medication within 20-30 minutes of getting to the floor.She feels that she had a rough night because of the pain medication, which was changed last night to help the patient.  She feels that she is doing better this morning.  Ready to be discharged home. Dorsiflexion/plantar flexion intact, incision: dressing C/D/I, no cellulitis present and compartment soft.   LABS  Basename    HGB     9.9  HCT     29.9    Discharge Exam: General appearance: alert, cooperative and no distress Extremities: Homans sign is negative, no sign of DVT, no edema, redness or tenderness in the calves or thighs and no ulcers, gangrene or trophic changes  Disposition: Home with follow up in 2 weeks   Follow-up Information    Mauri Pole, MD. Schedule an appointment as soon as possible for a visit in 2 week(s).   Specialty:  Orthopedic Surgery Contact information: [address] [place] [address] [phone]        [facility] Follow up.   Specialty:  Home Health  Services Why:  home health physical therapy Contact information: [address] [place] [address] [phone]        [facility] Follow up.   Why:  rolling walker and 3n1 Contact information: [address] [place]  [address] [phone]           Discharge Instructions    Call MD / Call 911    Complete by:  As directed    If you experience chest pain or shortness of breath, CALL 911 and be  transported to the hospital emergency room.  If you develope a fever above 101 F, pus (white drainage) or increased drainage or redness at the wound, or calf pain, call your surgeon's office.   Change dressing    Complete by:  As directed    Maintain surgical dressing until follow up in the clinic. If the edges start to pull up, may reinforce with tape. If the dressing is no longer working, may remove and cover with gauze and tape, but must keep the area dry and clean.  Call with any questions or concerns.   Constipation Prevention    Complete by:  As directed    Drink plenty of fluids.  Prune juice may be helpful.  You may use a stool softener, such as Colace (over the counter) 100 mg twice a day.  Use MiraLax (over the counter) for constipation as needed.   Diet - low sodium heart healthy    Complete by:  As directed    Discharge instructions    Complete by:  As directed    Maintain surgical dressing until follow up in the clinic. If the edges start to pull up, may reinforce with tape. If the dressing is no longer working, may remove and cover with gauze and tape, but must keep the area dry and clean.  Follow up in 2 weeks at [facility]. Call with any questions or concerns.   Increase activity slowly as tolerated    Complete by:  As directed    Weight bearing as tolerated with assist device (walker, cane, etc) as directed, use it as long as suggested by your surgeon or therapist, typically at least 4-6 weeks.   TED hose    Complete by:  As directed    Use stockings (TED hose) for 2 weeks on both leg(s).  You may remove them at night for sleeping.      Allergies as of 04/08/2016      Reactions   Clinoril [sulindac] Other (See Comments)   Pt doesn't remember    Voltaren [diclofenac Sodium] Other (See Comments)   Unknown doesn't remember       Medication List    STOP taking these medications   acetaminophen 500 MG tablet Commonly known as:  TYLENOL   hydroxychloroquine  200 MG tablet Commonly known as:  PLAQUENIL     TAKE these medications   aspirin 81 MG chewable tablet Chew 1 tablet (81 mg total) by mouth 2 (two) times daily. Take for 4 weeks.   CALCIUM-MAGNESIUM-VITAMIN D PO Take 1 capsule by mouth daily.   cholecalciferol 1000 units tablet Commonly known as:  VITAMIN D Take 1,000 Units by mouth daily.   citalopram 20 MG tablet Commonly known as:  CELEXA Take 20 mg by mouth daily.   docusate sodium 100 MG capsule Commonly known as:  COLACE Take 1 capsule (100 mg total) by mouth 2 (two) times daily.   ferrous  sulfate 325 (65 FE) MG tablet Commonly known as:  FERROUSUL Take 1 tablet (325 mg total) by mouth 3 (three) times daily with meals.   methocarbamol 500 MG tablet Commonly known as:  ROBAXIN Take 1 tablet (500 mg total) by mouth every 6 (six) hours as needed for muscle spasms.   oxyCODONE 5 MG immediate release tablet Commonly known as:  Oxy IR/ROXICODONE Take 1-2 tablets (5-10 mg total) by mouth every 4 (four) hours as needed for moderate pain.   polyethylene glycol packet Commonly known as:  MIRALAX / GLYCOLAX Take 17 g by mouth 2 (two) times daily.   sodium chloride 0.65 % Soln nasal spray Commonly known as:  OCEAN Place 1 spray into both nostrils every 6 (six) hours as needed for congestion.        Signed: [place]. Riel Hirschman   PA-C  04/10/2016, 2:50 PM

## 2016-04-13 DIAGNOSIS — M1990 Unspecified osteoarthritis, unspecified site: Secondary | ICD-10-CM | POA: Diagnosis not present

## 2016-04-13 DIAGNOSIS — F410 Panic disorder [episodic paroxysmal anxiety] without agoraphobia: Secondary | ICD-10-CM | POA: Diagnosis not present

## 2016-04-13 DIAGNOSIS — M069 Rheumatoid arthritis, unspecified: Secondary | ICD-10-CM | POA: Diagnosis not present

## 2016-04-13 DIAGNOSIS — F1010 Alcohol abuse, uncomplicated: Secondary | ICD-10-CM | POA: Diagnosis not present

## 2016-04-13 DIAGNOSIS — Z471 Aftercare following joint replacement surgery: Secondary | ICD-10-CM | POA: Diagnosis not present

## 2016-04-13 DIAGNOSIS — F329 Major depressive disorder, single episode, unspecified: Secondary | ICD-10-CM | POA: Diagnosis not present

## 2016-04-14 ENCOUNTER — Emergency Department (HOSPITAL_COMMUNITY)
Admission: EM | Admit: 2016-04-14 | Discharge: 2016-04-15 | Disposition: A | Discharge status: Home / Self Care | Payer: Medicare HMO | Attending: Physician Assistant | Admitting: Physician Assistant

## 2016-04-14 ENCOUNTER — Encounter (HOSPITAL_COMMUNITY): Payer: Self-pay | Admitting: *Deleted

## 2016-04-14 ENCOUNTER — Emergency Department (HOSPITAL_COMMUNITY): Payer: Medicare HMO

## 2016-04-14 DIAGNOSIS — Z87891 Personal history of nicotine dependence: Secondary | ICD-10-CM | POA: Insufficient documentation

## 2016-04-14 DIAGNOSIS — R0602 Shortness of breath: Secondary | ICD-10-CM | POA: Insufficient documentation

## 2016-04-14 DIAGNOSIS — Z96652 Presence of left artificial knee joint: Secondary | ICD-10-CM | POA: Insufficient documentation

## 2016-04-14 DIAGNOSIS — Z7982 Long term (current) use of aspirin: Secondary | ICD-10-CM | POA: Insufficient documentation

## 2016-04-14 LAB — COMPREHENSIVE METABOLIC PANEL
ALT: 17 U/L (ref 14–54)
AST: 24 U/L (ref 15–41)
Albumin: 3.6 g/dL (ref 3.5–5.0)
Alkaline Phosphatase: 57 U/L (ref 38–126)
Anion gap: 9 (ref 5–15)
BILIRUBIN TOTAL: 1.2 mg/dL (ref 0.3–1.2)
BUN: 10 mg/dL (ref 6–20)
CO2: 23 mmol/L (ref 22–32)
Calcium: 9.1 mg/dL (ref 8.9–10.3)
Chloride: 105 mmol/L (ref 101–111)
Creatinine, Ser: 0.64 mg/dL (ref 0.44–1.00)
Glucose, Bld: 112 mg/dL — ABNORMAL HIGH (ref 65–99)
Potassium: 3.8 mmol/L (ref 3.5–5.1)
Sodium: 137 mmol/L (ref 135–145)
TOTAL PROTEIN: 7.3 g/dL (ref 6.5–8.1)

## 2016-04-14 LAB — CBC WITH DIFFERENTIAL/PLATELET
BASOS ABS: 0 10*3/uL (ref 0.0–0.1)
Basophils Relative: 1 %
EOS ABS: 0.1 10*3/uL (ref 0.0–0.7)
EOS PCT: 2 %
HCT: 28.7 % — ABNORMAL LOW (ref 36.0–46.0)
HEMOGLOBIN: 9.3 g/dL — AB (ref 12.0–15.0)
LYMPHS ABS: 1.5 10*3/uL (ref 0.7–4.0)
Lymphocytes Relative: 21 %
MCH: 29.7 pg (ref 26.0–34.0)
MCHC: 32.4 g/dL (ref 30.0–36.0)
MCV: 91.7 fL (ref 78.0–100.0)
Monocytes Absolute: 0.7 10*3/uL (ref 0.1–1.0)
Monocytes Relative: 11 %
NEUTROS PCT: 65 %
Neutro Abs: 4.5 10*3/uL (ref 1.7–7.7)
PLATELETS: 344 10*3/uL (ref 150–400)
RBC: 3.13 MIL/uL — AB (ref 3.87–5.11)
RDW: 13.7 % (ref 11.5–15.5)
WBC: 6.8 10*3/uL (ref 4.0–10.5)

## 2016-04-14 LAB — PROTIME-INR
INR: 1.02
PROTHROMBIN TIME: 13.5 s (ref 11.4–15.2)

## 2016-04-14 LAB — I-STAT CG4 LACTIC ACID, ED: LACTIC ACID, VENOUS: 1.5 mmol/L (ref 0.5–1.9)

## 2016-04-14 LAB — BRAIN NATRIURETIC PEPTIDE: B NATRIURETIC PEPTIDE 5: 131 pg/mL — AB (ref 0.0–100.0)

## 2016-04-14 LAB — I-STAT TROPONIN, ED: Troponin i, poc: 0.02 ng/mL (ref 0.00–0.08)

## 2016-04-14 MED ORDER — SODIUM CHLORIDE 0.9 % IV BOLUS (SEPSIS): 1000.0000 mL | Freq: Once | INTRAVENOUS | Status: DC

## 2016-04-14 MED ORDER — IOPAMIDOL (ISOVUE-370) INJECTION 76%
100.0000 mL | Freq: Once | INTRAVENOUS | Status: AC | PRN
  Administered 2016-04-14: 100 mL via INTRAVENOUS

## 2016-04-14 MED ORDER — IOPAMIDOL (ISOVUE-370) INJECTION 76%
INTRAVENOUS | Status: AC
  Filled 2016-04-14: qty 100

## 2016-04-14 NOTE — ED Provider Notes (Signed)
[facility] DEPT Provider Note   CSN: [id number] Arrival date & time: 04/14/16  1857     History   Chief Complaint Chief Complaint  Patient presents with  . Chest Pain  . Post-op Problem  . Shortness of Breath    HPI [name] [name] is a 71 y.o. female.  The history is provided by the patient and a relative.  Chest Pain   This is a new problem. The current episode started 2 days ago. The problem occurs rarely. The problem has been gradually worsening. The pain is associated with exertion, walking and breathing. The pain is present in the substernal region. The pain is at a severity of 2/10. The quality of the pain is described as brief and sharp. The pain does not radiate. Associated symptoms include palpitations and shortness of breath. Pertinent negatives include no fever. She has tried rest for the symptoms. The treatment provided mild relief. Past medical history comments: recent knee surgery  Shortness of Breath  Associated symptoms include chest pain. Pertinent negatives include no fever.   + Near syncope and palpations today, worse with exertion   Past Medical History:  Diagnosis Date  . Anxiety disorder   . Arthritis   . Depression   . Flu    01/2017   . GERD (gastroesophageal reflux disease)   . Panic attack    panic attacks   . RA (rheumatoid arthritis) ([place])   . Urinary tract infection    hx of 02/2016   . Vitamin D deficiency     Patient Active Problem List   Diagnosis Date Noted  . Status post total left knee replacement 04/07/2016  . Abnormal ECG 03/20/2016  . Pelvic prolapse 12/13/2014  . Alcohol abuse 10/31/2014  . Pre-syncope 10/05/2014  . Dizziness 10/04/2014    Past Surgical History:  Procedure Laterality Date  . ABDOMINAL HYSTERECTOMY    . ANTERIOR AND POSTERIOR REPAIR WITH SACROSPINOUS FIXATION N/A 12/13/2014   Procedure: ANTERIOR AND POSTERIOR REPAIR WITH SACROSPINOUS LIGAMENT SUSPENSION;  Surgeon: Everlene Farrier, MD;  Location: [facility]  ORS;  Service: Gynecology;  Laterality: N/A;  . bladder tack surgery     . BRONCHOSCOPY    . COLONOSCOPY    . TOTAL KNEE ARTHROPLASTY Left 04/07/2016   Procedure: LEFT  TOTAL KNEE ARTHROPLASTY;  Surgeon: Paralee Cancel, MD;  Location: [facility] ORS;  Service: Orthopedics;  Laterality: Left;    OB History    No data available       Home Medications    Prior to Admission medications   Medication Sig Start Date End Date Taking? Authorizing Provider  aspirin 81 MG chewable tablet Chew 1 tablet (81 mg total) by mouth 2 (two) times daily. Take for 4 weeks. 04/08/16 05/08/16 Yes Matthew Babish, PA-C  citalopram (CELEXA) 20 MG tablet Take 20 mg by mouth daily. 10/26/14  Yes Historical Provider, MD  docusate sodium (COLACE) 100 MG capsule Take 1 capsule (100 mg total) by mouth 2 (two) times daily. 04/07/16  Yes Danae Orleans, PA-C  ferrous sulfate (FERROUSUL) 325 (65 FE) MG tablet Take 1 tablet (325 mg total) by mouth 3 (three) times daily with meals. 04/07/16  Yes Danae Orleans, PA-C  methocarbamol (ROBAXIN) 500 MG tablet Take 1 tablet (500 mg total) by mouth every 6 (six) hours as needed for muscle spasms. 04/07/16  Yes Danae Orleans, PA-C  oxyCODONE (OXY IR/ROXICODONE) 5 MG immediate release tablet Take 1-2 tablets (5-10 mg total) by mouth every 4 (four) hours as needed for moderate  pain. 04/08/16  Yes Danae Orleans, PA-C  polyethylene glycol (MIRALAX / GLYCOLAX) packet Take 17 g by mouth 2 (two) times daily. 04/07/16  Yes Danae Orleans, PA-C  sodium chloride (OCEAN) 0.65 % SOLN nasal spray Place 1 spray into both nostrils every 6 (six) hours as needed for congestion.    Historical Provider, MD    Family History Family History  Problem Relation Age of Onset  . Heart attack Mother     Social History Social History  Substance Use Topics  . Smoking status: Former Research scientist (life sciences)  . Smokeless tobacco: Never Used  . Alcohol use Yes     Comment: 2-3 glasses of wine some days "I probably drink  more than I  should"     Allergies   Clinoril [sulindac] and Voltaren [diclofenac sodium]   Review of Systems Review of Systems  Constitutional: Positive for fatigue. Negative for fever.  Respiratory: Positive for shortness of breath.   Cardiovascular: Positive for chest pain and palpitations.  Neurological: Positive for syncope.  All other systems reviewed and are negative.    Physical Exam Updated Vital Signs BP 121/61   Pulse 78   Temp 97.3 F (36.3 C) (Oral)   Resp 11   SpO2 98%   Physical Exam  Constitutional: She is oriented to person, place, and time. She appears well-developed and well-nourished.  HENT:  Head: Normocephalic and atraumatic.  Eyes: Right eye exhibits no discharge.  Cardiovascular: Regular rhythm and normal heart sounds.   No murmur heard. tachycardia  Pulmonary/Chest: Effort normal and breath sounds normal. She has no wheezes. She has no rales.  Abdominal: Soft. She exhibits no distension. There is no tenderness.  Musculoskeletal:  Well healing incicsion and swelling to L knee  Neurological: She is oriented to person, place, and time.  Skin: Skin is warm and dry. She is not diaphoretic.  Psychiatric: She has a normal mood and affect.  Nursing note and vitals reviewed.    ED Treatments / Results  Labs (all labs ordered are listed, but only abnormal results are displayed) Labs Reviewed  COMPREHENSIVE METABOLIC PANEL - Abnormal; Notable for the following:       Result Value   Glucose, Bld 112 (*)    All other components within normal limits  CBC WITH DIFFERENTIAL/PLATELET - Abnormal; Notable for the following:    RBC 3.13 (*)    Hemoglobin 9.3 (*)    HCT 28.7 (*)    All other components within normal limits  BRAIN NATRIURETIC PEPTIDE - Abnormal; Notable for the following:    B Natriuretic Peptide 131.0 (*)    All other components within normal limits  PROTIME-INR  I-STAT CG4 LACTIC ACID, ED  I-STAT TROPOININ, ED  I-STAT TROPOININ, ED    EKG   EKG Interpretation  Date/Time:  Tuesday April 14 2016 19:21:15 EST Ventricular Rate:  78 PR Interval:    QRS Duration: 94 QT Interval:  362 QTC Calculation: 413 R Axis:   29 Text Interpretation:  Sinus rhythm Abnormal R-wave progression, early transition Abnormal T, consider ischemia, anterior leads No significant change since last tracing Confirmed by Gerald Leitz ([id number]) on 04/14/2016 7:31:47 PM Also confirmed by Gerald Leitz ([id number])  on 04/14/2016 11:31:50 PM       Radiology Ct Angio Chest Pe W And/or Wo Contrast  Result Date: 04/14/2016 CLINICAL DATA:  Syncope, shortness of breath and tachycardia. Status post knee replacement 04/07/2016. EXAM: CT ANGIOGRAPHY CHEST WITH CONTRAST TECHNIQUE: Multidetector CT imaging of the chest was performed  using the standard protocol during bolus administration of intravenous contrast. Multiplanar CT image reconstructions and MIPs were obtained to evaluate the vascular anatomy. CONTRAST:  100 ml Isovue 370. COMPARISON:  PA and lateral chest 10/04/2014. FINDINGS: Cardiovascular: No pulmonary embolus is identified. There is calcific aortic and coronary atherosclerosis. No pericardial effusion. Heart size is upper normal. Mediastinum/Nodes: Very small hiatal hernia is seen. No lymphadenopathy. Thyroid gland is unremarkable. Lungs/Pleura: No pleural effusion. Mild dependent atelectasis is seen. Lungs are otherwise clear. Upper Abdomen: No acute abnormality. Musculoskeletal: No lytic or sclerotic lesion. There is some glenohumeral degenerative change bilaterally. Review of the MIP images confirms the above findings. IMPRESSION: Negative for pulmonary embolus.  No acute finding. Very small hiatal hernia. Calcific aortic and coronary atherosclerosis. Electronically Signed   By: Inge Rise M.D.   On: 04/14/2016 21:23    Procedures Procedures (including critical care time)  Medications Ordered in ED Medications  iopamidol (ISOVUE-370) 76 %  injection (not administered)  sodium chloride 0.9 % bolus 1,000 mL (not administered)  iopamidol (ISOVUE-370) 76 % injection 100 mL (100 mLs Intravenous Contrast Given 04/14/16 2051)     Initial Impression / Assessment and Plan / ED Course  I have reviewed the triage vital signs and the nursing notes.  Pertinent labs & imaging results that were available during my care of the patient were reviewed by me and considered in my medical decision making (see chart for details).    Patient here 1 week sp total L knee here with lightheadedness, SOB and palpitations.  Patien thas pleuritic chest pain. Concerned for PE.  Will get CT angio   CT angios negative. Patient had normal vital signs and arrival. Patient has not had any symptoms here. Patient describes symptoms more as lightheadedness and wooziness upon standing. Patient that she is taking both oxycodone and Flexeril. She is concerned on his medications is causing her symptoms. She reports that walking from bedroom to the bathroom would cause her to be symptomatic. Lying still she does not have any symptoms.  We'll get delta troponin. We'll have her ambulate and checks pulse pulse ox while ambulating. .  With negative delta trops, negative CT, normal labs, EKG and vital signs including ambulation trial, will have her watch for symtpoms at home and return with any return of symtpoms or other concerns.  Will have her be mindlful of medications (could be med effect?)   Final Clinical Impressions(s) / ED Diagnoses   Final diagnoses:  None    New Prescriptions New Prescriptions   No medications on file     Courteney Julio Alm, MD 04/15/16 0007

## 2016-04-14 NOTE — ED Triage Notes (Signed)
Pt is status post L knee surgery x 1 week.  Started to have SOB with exertion, and chest tightness this afternoon.  Swelling noted to her L knee.

## 2016-04-15 DIAGNOSIS — M1990 Unspecified osteoarthritis, unspecified site: Secondary | ICD-10-CM | POA: Diagnosis not present

## 2016-04-15 DIAGNOSIS — F329 Major depressive disorder, single episode, unspecified: Secondary | ICD-10-CM | POA: Diagnosis not present

## 2016-04-15 DIAGNOSIS — Z471 Aftercare following joint replacement surgery: Secondary | ICD-10-CM | POA: Diagnosis not present

## 2016-04-15 DIAGNOSIS — M069 Rheumatoid arthritis, unspecified: Secondary | ICD-10-CM | POA: Diagnosis not present

## 2016-04-15 DIAGNOSIS — F410 Panic disorder [episodic paroxysmal anxiety] without agoraphobia: Secondary | ICD-10-CM | POA: Diagnosis not present

## 2016-04-15 DIAGNOSIS — F1010 Alcohol abuse, uncomplicated: Secondary | ICD-10-CM | POA: Diagnosis not present

## 2016-04-15 LAB — I-STAT TROPONIN, ED: Troponin i, poc: 0.03 ng/mL (ref 0.00–0.08)

## 2016-04-15 NOTE — Discharge Instructions (Signed)
We are unsure what caused your symptoms today. It may be a combination of the strong pain medications andthe muscle relaxants. It could also be something more dangerous like your heart or lungs. We reasonably ruled out any kind of dangerous pathology today in the emergency department. However keep a close eye and your symptoms, record them and please return with any concerns.

## 2016-04-15 NOTE — ED Notes (Signed)
Sats 98% with ambulation per NT Sam.

## 2016-04-17 DIAGNOSIS — Z471 Aftercare following joint replacement surgery: Secondary | ICD-10-CM | POA: Diagnosis not present

## 2016-04-17 DIAGNOSIS — F329 Major depressive disorder, single episode, unspecified: Secondary | ICD-10-CM | POA: Diagnosis not present

## 2016-04-17 DIAGNOSIS — M069 Rheumatoid arthritis, unspecified: Secondary | ICD-10-CM | POA: Diagnosis not present

## 2016-04-17 DIAGNOSIS — M1990 Unspecified osteoarthritis, unspecified site: Secondary | ICD-10-CM | POA: Diagnosis not present

## 2016-04-17 DIAGNOSIS — F410 Panic disorder [episodic paroxysmal anxiety] without agoraphobia: Secondary | ICD-10-CM | POA: Diagnosis not present

## 2016-04-17 DIAGNOSIS — F1010 Alcohol abuse, uncomplicated: Secondary | ICD-10-CM | POA: Diagnosis not present

## 2016-04-21 DIAGNOSIS — M1990 Unspecified osteoarthritis, unspecified site: Secondary | ICD-10-CM | POA: Diagnosis not present

## 2016-04-21 DIAGNOSIS — M069 Rheumatoid arthritis, unspecified: Secondary | ICD-10-CM | POA: Diagnosis not present

## 2016-04-21 DIAGNOSIS — F410 Panic disorder [episodic paroxysmal anxiety] without agoraphobia: Secondary | ICD-10-CM | POA: Diagnosis not present

## 2016-04-21 DIAGNOSIS — F1010 Alcohol abuse, uncomplicated: Secondary | ICD-10-CM | POA: Diagnosis not present

## 2016-04-21 DIAGNOSIS — Z471 Aftercare following joint replacement surgery: Secondary | ICD-10-CM | POA: Diagnosis not present

## 2016-04-21 DIAGNOSIS — F329 Major depressive disorder, single episode, unspecified: Secondary | ICD-10-CM | POA: Diagnosis not present

## 2016-04-24 DIAGNOSIS — M069 Rheumatoid arthritis, unspecified: Secondary | ICD-10-CM | POA: Diagnosis not present

## 2016-04-24 DIAGNOSIS — Z471 Aftercare following joint replacement surgery: Secondary | ICD-10-CM | POA: Diagnosis not present

## 2016-04-24 DIAGNOSIS — M1990 Unspecified osteoarthritis, unspecified site: Secondary | ICD-10-CM | POA: Diagnosis not present

## 2016-04-24 DIAGNOSIS — F410 Panic disorder [episodic paroxysmal anxiety] without agoraphobia: Secondary | ICD-10-CM | POA: Diagnosis not present

## 2016-04-24 DIAGNOSIS — F329 Major depressive disorder, single episode, unspecified: Secondary | ICD-10-CM | POA: Diagnosis not present

## 2016-04-24 DIAGNOSIS — F1010 Alcohol abuse, uncomplicated: Secondary | ICD-10-CM | POA: Diagnosis not present

## 2016-04-27 DIAGNOSIS — M25662 Stiffness of left knee, not elsewhere classified: Secondary | ICD-10-CM | POA: Diagnosis not present

## 2016-04-29 ENCOUNTER — Encounter: Payer: Self-pay | Admitting: Physical Therapy

## 2016-04-29 ENCOUNTER — Ambulatory Visit: Payer: Medicare HMO | Attending: Orthopedic Surgery | Admitting: Physical Therapy

## 2016-04-29 DIAGNOSIS — R2242 Localized swelling, mass and lump, left lower limb: Secondary | ICD-10-CM | POA: Insufficient documentation

## 2016-04-29 DIAGNOSIS — M25562 Pain in left knee: Secondary | ICD-10-CM | POA: Diagnosis not present

## 2016-04-29 DIAGNOSIS — M25662 Stiffness of left knee, not elsewhere classified: Secondary | ICD-10-CM | POA: Diagnosis not present

## 2016-04-29 DIAGNOSIS — R262 Difficulty in walking, not elsewhere classified: Secondary | ICD-10-CM | POA: Insufficient documentation

## 2016-04-29 NOTE — Therapy (Signed)
[address] [place], [place], [address] Phone: [phone]   Fax:  [phone]  Physical Therapy Evaluation  Patient Details  Name: [name] [name] MRN: [id number] Date of Birth: 01-23-1946 Referring Provider: Alvan Dame  Encounter Date: 04/29/2016      PT End of Session - 04/29/16 1610    Visit Number 1   Date for PT Re-Evaluation 06/29/16   PT Start Time 1440   PT Stop Time 1534   PT Time Calculation (min) 54 min   Activity Tolerance Patient tolerated treatment well   Behavior During Therapy [facility] for tasks assessed/performed      Past Medical History:  Diagnosis Date  . Anxiety disorder   . Arthritis   . Depression   . Flu    01/2017   . GERD (gastroesophageal reflux disease)   . Panic attack    panic attacks   . RA (rheumatoid arthritis) ([place])   . Urinary tract infection    hx of 02/2016   . Vitamin D deficiency     Past Surgical History:  Procedure Laterality Date  . ABDOMINAL HYSTERECTOMY    . ANTERIOR AND POSTERIOR REPAIR WITH SACROSPINOUS FIXATION N/A 12/13/2014   Procedure: ANTERIOR AND POSTERIOR REPAIR WITH SACROSPINOUS LIGAMENT SUSPENSION;  Surgeon: Everlene Farrier, MD;  Location: [facility];  Service: Gynecology;  Laterality: N/A;  . bladder tack surgery     . BRONCHOSCOPY    . COLONOSCOPY    . TOTAL KNEE ARTHROPLASTY Left 04/07/2016   Procedure: LEFT  TOTAL KNEE ARTHROPLASTY;  Surgeon: Paralee Cancel, MD;  Location: [facility];  Service: Orthopedics;  Laterality: Left;    There were no vitals filed for this visit.       Subjective Assessment - 04/29/16 1446    Subjective Patient underwent a left TKR on 04/07/16.  She reports that she has had left knee pain for about 5 years, reports that she managed with a brace, cortisone injections and hyalgan injections.  She reports that she had home PT until last week.   Limitations Lifting;Standing;Walking;House hold activities   Patient Stated  Goals walk better, have good ROM and no pain   Currently in Pain? Yes   Pain Score 2    Pain Location Knee   Pain Orientation Left   Pain Descriptors / Indicators Aching;Tightness   Pain Type Acute pain   Pain Onset 1 to 4 weeks ago   Pain Frequency Constant   Aggravating Factors  walking, bending the knee and standing will increase the pain as does nighttime, pain up to 6/10   Pain Relieving Factors rest, ice and pain medicine can get the pain down to a 2/10   Effect of Pain on Daily Activities difficulty walking            [facility] PT Assessment - 04/29/16 0001      Assessment   Medical Diagnosis s/p left TKR   Referring Provider Alvan Dame   Onset Date/Surgical Date 04/07/16   Prior Therapy home PT     Precautions   Precautions None     Balance Screen   Has the patient fallen in the past 6 months No   Has the patient had a decrease in activity level because of a fear of falling?  No   Is the patient reluctant to leave their home because of a fear of falling?  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[employer] [place] no exercise     Observation/Other Assessments-Edema    Edema Circumferential     Circumferential Edema   Circumferential - Right 37 cm   Circumferential - Left  42 cm mid patella     ROM / Strength   AROM / PROM / Strength AROM;PROM;Strength     AROM   AROM Assessment Site Knee   Right/Left Knee Left   Left Knee Extension 22   Left Knee Flexion 87     PROM   PROM Assessment Site Knee   Right/Left Knee Left   Left Knee Extension 8   Left Knee Flexion 95     Strength   Strength Assessment Site Knee   Right/Left Knee Left   Left Knee Flexion 4-/5   Left Knee Extension 4-/5     Palpation   Palpation comment scar is slightly  puckered, mild sensitivity, very mild temperature, no redness     Ambulation/Gait   Gait Comments SPC, slow and stiff legged, step to type pattern with the left     Standardized Balance Assessment   Standardized Balance Assessment Timed Up and Go Test     Timed Up and Go Test   Normal TUG (seconds) 20                   [facility] Adult PT Treatment/Exercise - 04/29/16 0001      Exercises   Exercises Knee/Hip     Knee/Hip Exercises: Aerobic   Recumbent Bike bike partial revs 2 minutes   Nustep Level 4 x 5 minutes     Modalities   Modalities Vasopneumatic     Vasopneumatic   Number Minutes Vasopneumatic  15 minutes   Vasopnuematic Location  Knee   Vasopneumatic Pressure Medium   Vasopneumatic Temperature  36                PT Education - 04/29/16 1607    Education provided Yes   Education Details low load long duration stretches for flexion and stretches   Person(s) Educated Patient   Methods Explanation;Demonstration;Handout;Tactile cues   Comprehension Verbalized understanding;Returned demonstration          PT Short Term Goals - 04/29/16 1613      PT SHORT TERM GOAL #1   Title independent with initial HEP   Time 2   Period Weeks   Status New           PT Long Term Goals - 04/29/16 1613      PT LONG TERM GOAL #1   Title understand RICE at home   Time 8   Period Weeks   Status New     PT LONG TERM GOAL #2   Title decrease pain 50%   Time 8   Period Weeks   Status New     PT LONG TERM GOAL #3   Title increase AROM to 5-115 degrees flexion   Time 8   Period Weeks   Status New     PT LONG TERM GOAL #4   Title go up and down stairs step over step   Time 8   Period Weeks   Status New     PT LONG TERM GOAL #5   Title walk 500 feet without assistive device  with minimal deviation   Time 8   Period Weeks   Status New               Plan - 04/29/16 1611    Clinical Impression Statement Patient with a left TKR on  04/07/16, she reports that she had pain about 5 years before.  She has significant swelling of the knee, scar is healing but puckered and tender.  She is walking with a SPC, slow and stiff legged.  Her AROM was 20-85 degrees flexion   Rehab Potential Good   PT Frequency 2x / week   PT Duration 8 weeks   PT Treatment/Interventions ADLs/Self Care Home Management;Cryotherapy;Electrical Stimulation;Gait training;Stair training;Functional mobility training;Balance training;Therapeutic exercise;Therapeutic activities;Manual techniques;Vasopneumatic Device   PT Next Visit Plan add exercises work on ROM and better gait pattern   Consulted and Agree with Plan of Care Patient      Patient will benefit from skilled therapeutic intervention in order to improve the following deficits and impairments:  Abnormal gait, Decreased activity tolerance, Decreased balance, Decreased mobility, Decreased strength, Increased edema, Decreased scar mobility, Decreased range of motion, Difficulty walking, Impaired flexibility, Pain  Visit Diagnosis: Acute pain of left knee - Plan: PT plan of care cert/re-cert  Stiffness of left knee, not elsewhere classified - Plan: PT plan of care cert/re-cert  Difficulty in walking, not elsewhere classified - Plan: PT plan of care cert/re-cert  Localized swelling, mass and lump, left lower limb - Plan: PT plan of care cert/re-cert      G-Codes - 01/60/10 1615    Functional Assessment Tool Used (Outpatient Only) foto 72% limitation   Functional Limitation Mobility: Walking and moving around   Mobility: Walking and Moving Around Current Status [phone]) At least 60 percent but less than 80 percent impaired, limited or restricted   Mobility: Walking and Moving Around Goal Status [phone]) At least 40 percent but less than 60 percent impaired, limited or restricted       Problem List Patient Active Problem List   Diagnosis Date Noted  . Status post total left knee replacement  04/07/2016  . Abnormal ECG 03/20/2016  . Pelvic prolapse 12/13/2014  . Alcohol abuse 10/31/2014  . Pre-syncope 10/05/2014  . Dizziness 10/04/2014    Sumner Boast., PT 04/29/2016, 4:17 PM  [place] [address] [place], [place], [address] Phone: [phone]   Fax:  [phone]  Name: [name] [name] MRN: [id number] Date of Birth: [birth year]

## 2016-05-05 ENCOUNTER — Encounter: Payer: Self-pay | Admitting: Physical Therapy

## 2016-05-05 ENCOUNTER — Ambulatory Visit: Payer: Medicare HMO | Admitting: Physical Therapy

## 2016-05-05 DIAGNOSIS — R2242 Localized swelling, mass and lump, left lower limb: Secondary | ICD-10-CM | POA: Diagnosis not present

## 2016-05-05 DIAGNOSIS — M25662 Stiffness of left knee, not elsewhere classified: Secondary | ICD-10-CM | POA: Diagnosis not present

## 2016-05-05 DIAGNOSIS — M25562 Pain in left knee: Secondary | ICD-10-CM

## 2016-05-05 DIAGNOSIS — R262 Difficulty in walking, not elsewhere classified: Secondary | ICD-10-CM

## 2016-05-05 NOTE — Therapy (Signed)
[address] [address] [place], [place], [address] Phone: [phone]   Fax:  [phone]  Physical Therapy Treatment  Patient Details  Name: [name] [name] MRN: [id number] Date of Birth: 10/02/1945 Referring Provider: Alvan Dame  Encounter Date: 05/05/2016      PT End of Session - 05/05/16 1215    Visit Number 2   Date for PT Re-Evaluation 06/29/16   PT Start Time 1130   PT Stop Time 1232   PT Time Calculation (min) 62 min      Past Medical History:  Diagnosis Date  . Anxiety disorder   . Arthritis   . Depression   . Flu    01/2017   . GERD (gastroesophageal reflux disease)   . Panic attack    panic attacks   . RA (rheumatoid arthritis) ([place])   . Urinary tract infection    hx of 02/2016   . Vitamin D deficiency     Past Surgical History:  Procedure Laterality Date  . ABDOMINAL HYSTERECTOMY    . ANTERIOR AND POSTERIOR REPAIR WITH SACROSPINOUS FIXATION N/A 12/13/2014   Procedure: ANTERIOR AND POSTERIOR REPAIR WITH SACROSPINOUS LIGAMENT SUSPENSION;  Surgeon: Everlene Farrier, MD;  Location: [facility];  Service: Gynecology;  Laterality: N/A;  . bladder tack surgery     . BRONCHOSCOPY    . COLONOSCOPY    . TOTAL KNEE ARTHROPLASTY Left 04/07/2016   Procedure: LEFT  TOTAL KNEE ARTHROPLASTY;  Surgeon: Paralee Cancel, MD;  Location: [facility];  Service: Orthopedics;  Laterality: Left;    There were no vitals filed for this visit.      Subjective Assessment - 05/05/16 1138    Subjective off pain meds , I think goingthru withdrawl. over did yesterday so hurting   Currently in Pain? Yes   Pain Score 3    Pain Location Knee   Pain Orientation Left            [facility] PT Assessment - 05/05/16 0001      AROM   Left Knee Extension 14   Left Knee Flexion 110  after MT                     [facility] Adult PT Treatment/Exercise - 05/05/16 0001      Ambulation/Gait   Gait Comments worked on amb heel  strike to encourage knee ext     Knee/Hip Exercises: Aerobic   Recumbent Bike bike partial revs 5 minutes   Nustep L 4 6 min     Knee/Hip Exercises: Machines for Strengthening   Cybex Leg Press 30 # 2 sets 10  calf raises 30# 2 sets 10     Knee/Hip Exercises: Standing   Heel Raises 15 reps  black bar. toe raises 15 times   Other Standing Knee Exercises 3# marching, hip flex and HS curl 15 each     Knee/Hip Exercises: Seated   Long Arc Quad Left;Strengthening;3 sets;10 reps;Weights   Long Arc Quad Weight 3 lbs.     Vasopneumatic   Number Minutes Vasopneumatic  15 minutes   Vasopnuematic Location  Knee   Vasopneumatic Pressure Medium   Vasopneumatic Temperature  36     Manual Therapy   Manual Therapy Passive ROM;Soft tissue mobilization   Soft tissue mobilization flex/pat knee   Passive ROM flex/ext                PT Education - 05/05/16 1212  Education provided Yes   Education Details heel strike gait. calf step stretch   Person(s) Educated Patient   Methods Explanation;Demonstration   Comprehension Verbalized understanding;Returned demonstration          PT Short Term Goals - 04/29/16 1613      PT SHORT TERM GOAL #1   Title independent with initial HEP   Time 2   Period Weeks   Status New           PT Long Term Goals - 04/29/16 1613      PT LONG TERM GOAL #1   Title understand RICE at home   Time 8   Period Weeks   Status New     PT LONG TERM GOAL #2   Title decrease pain 50%   Time 8   Period Weeks   Status New     PT LONG TERM GOAL #3   Title increase AROM to 5-115 degrees flexion   Time 8   Period Weeks   Status New     PT LONG TERM GOAL #4   Title go up and down stairs step over step   Time 8   Period Weeks   Status New     PT LONG TERM GOAL #5   Title walk 500 feet without assistive device with minimal deviation   Time 8   Period Weeks   Status New               Plan - 05/05/16 1215    Clinical Impression  Statement excellent increased in ROM esp flex, ext improved but still bigger deficiets- educ on heel strike with gait vs toe walking.Very pain rt knee ( non surgical ) so some limiations with bilateral knees ie on machines   PT Next Visit Plan gait/ROM esp ext, quad strength      Patient will benefit from skilled therapeutic intervention in order to improve the following deficits and impairments:  Abnormal gait, Decreased activity tolerance, Decreased balance, Decreased mobility, Decreased strength, Increased edema, Decreased scar mobility, Decreased range of motion, Difficulty walking, Impaired flexibility, Pain  Visit Diagnosis: Acute pain of left knee  Stiffness of left knee, not elsewhere classified  Localized swelling, mass and lump, left lower limb  Difficulty in walking, not elsewhere classified     Problem List Patient Active Problem List   Diagnosis Date Noted  . Status post total left knee replacement 04/07/2016  . Abnormal ECG 03/20/2016  . Pelvic prolapse 12/13/2014  . Alcohol abuse 10/31/2014  . Pre-syncope 10/05/2014  . Dizziness 10/04/2014    [name] [name],ANGIE PTA 05/05/2016, 12:17 PM  [place] [address] [place], [place], [address] Phone: [phone]   Fax:  [phone]  Name: [name] [name] MRN: [id number] Date of Birth: [birth year]

## 2016-05-07 ENCOUNTER — Encounter: Payer: Self-pay | Admitting: Physical Therapy

## 2016-05-07 ENCOUNTER — Ambulatory Visit: Payer: Medicare HMO | Admitting: Physical Therapy

## 2016-05-07 DIAGNOSIS — M25662 Stiffness of left knee, not elsewhere classified: Secondary | ICD-10-CM | POA: Diagnosis not present

## 2016-05-07 DIAGNOSIS — R262 Difficulty in walking, not elsewhere classified: Secondary | ICD-10-CM

## 2016-05-07 DIAGNOSIS — M25562 Pain in left knee: Secondary | ICD-10-CM

## 2016-05-07 DIAGNOSIS — R2242 Localized swelling, mass and lump, left lower limb: Secondary | ICD-10-CM | POA: Diagnosis not present

## 2016-05-07 NOTE — Therapy (Signed)
[place] [address] [place], [place], [address] Phone: [phone]   Fax:  [phone]  Physical Therapy Treatment  Patient Details  Name: [name] [name] MRN: [id number] Date of Birth: July 18, 1945 Referring Provider: Alvan Dame  Encounter Date: 05/07/2016      PT End of Session - 05/07/16 1517    Visit Number 3   Date for PT Re-Evaluation 06/29/16   PT Start Time 1435   PT Stop Time 1535   PT Time Calculation (min) 60 min      Past Medical History:  Diagnosis Date  . Anxiety disorder   . Arthritis   . Depression   . Flu    01/2017   . GERD (gastroesophageal reflux disease)   . Panic attack    panic attacks   . RA (rheumatoid arthritis) ([place])   . Urinary tract infection    hx of 02/2016   . Vitamin D deficiency     Past Surgical History:  Procedure Laterality Date  . ABDOMINAL HYSTERECTOMY    . ANTERIOR AND POSTERIOR REPAIR WITH SACROSPINOUS FIXATION N/A 12/13/2014   Procedure: ANTERIOR AND POSTERIOR REPAIR WITH SACROSPINOUS LIGAMENT SUSPENSION;  Surgeon: Everlene Farrier, MD;  Location: [facility];  Service: Gynecology;  Laterality: N/A;  . bladder tack surgery     . BRONCHOSCOPY    . COLONOSCOPY    . TOTAL KNEE ARTHROPLASTY Left 04/07/2016   Procedure: LEFT  TOTAL KNEE ARTHROPLASTY;  Surgeon: Paralee Cancel, MD;  Location: [facility];  Service: Orthopedics;  Laterality: Left;    There were no vitals filed for this visit.      Subjective Assessment - 05/07/16 1435    Subjective maybe should not of stopped pain meds, maybe I am over doing   Currently in Pain? Yes   Pain Score 4    Pain Location Knee   Pain Orientation Left                         [facility] Adult PT Treatment/Exercise - 05/07/16 0001      Knee/Hip Exercises: Aerobic   Recumbent Bike bike partial revs 5 minutes   Nustep L 5 6 min     Knee/Hip Exercises: Standing   Heel Raises 20 reps  black bar   Lateral Step Up 1  set;Step Height: 6";Hand Hold: 2;10 reps   Forward Step Up Left;1 set;10 reps;Hand Hold: 2;Step Height: 6"   Other Standing Knee Exercises TKE with ball on wall 20 times     Knee/Hip Exercises: Seated   Long Arc Quad Left;2 sets;10 reps  red tband   Other Seated Knee/Hip Exercises fitter 1 black 2 sets 10 flex/ext  red tband TKE 2 sets 10   Hamstring Curl Left;2 sets;10 reps  red tband     Vasopneumatic   Number Minutes Vasopneumatic  15 minutes   Vasopnuematic Location  Knee   Vasopneumatic Pressure Medium   Vasopneumatic Temperature  36     Manual Therapy   Manual Therapy Joint mobilization   Joint Mobilization blet mobs for ext                  PT Short Term Goals - 05/07/16 1518      PT SHORT TERM GOAL #1   Title independent with initial HEP   Status Achieved           PT Long Term Goals - 04/29/16 2620  PT LONG TERM GOAL #1   Title understand RICE at home   Time 8   Period Weeks   Status New     PT LONG TERM GOAL #2   Title decrease pain 50%   Time 8   Period Weeks   Status New     PT LONG TERM GOAL #3   Title increase AROM to 5-115 degrees flexion   Time 8   Period Weeks   Status New     PT LONG TERM GOAL #4   Title go up and down stairs step over step   Time 8   Period Weeks   Status New     PT LONG TERM GOAL #5   Title walk 500 feet without assistive device with minimal deviation   Time 8   Period Weeks   Status New               Plan - 05/07/16 1517    Clinical Impression Statement pt with improved heel strike but still amb with slight knee flex,focis session on ext ex and stretches. Weakness for flex and ext with cuing needed for increased ROM. STG met   PT Next Visit Plan gait/ROM esp ext, quad strength      Patient will benefit from skilled therapeutic intervention in order to improve the following deficits and impairments:  Abnormal gait, Decreased activity tolerance, Decreased balance, Decreased mobility,  Decreased strength, Increased edema, Decreased scar mobility, Decreased range of motion, Difficulty walking, Impaired flexibility, Pain  Visit Diagnosis: Acute pain of left knee  Stiffness of left knee, not elsewhere classified  Localized swelling, mass and lump, left lower limb  Difficulty in walking, not elsewhere classified     Problem List Patient Active Problem List   Diagnosis Date Noted  . Status post total left knee replacement 04/07/2016  . Abnormal ECG 03/20/2016  . Pelvic prolapse 12/13/2014  . Alcohol abuse 10/31/2014  . Pre-syncope 10/05/2014  . Dizziness 10/04/2014    PAYSEUR,ANGIE PTA 05/07/2016, 3:19 PM  [address] [place], [place], [address] Phone: [phone]   Fax:  [phone]  Name: [name] [name] MRN: [id number] Date of Birth: 09/24/1945

## 2016-05-12 ENCOUNTER — Encounter: Payer: Self-pay | Admitting: Physical Therapy

## 2016-05-12 ENCOUNTER — Ambulatory Visit: Payer: Medicare HMO | Admitting: Physical Therapy

## 2016-05-12 DIAGNOSIS — M25562 Pain in left knee: Secondary | ICD-10-CM

## 2016-05-12 DIAGNOSIS — M25662 Stiffness of left knee, not elsewhere classified: Secondary | ICD-10-CM | POA: Diagnosis not present

## 2016-05-12 DIAGNOSIS — R262 Difficulty in walking, not elsewhere classified: Secondary | ICD-10-CM

## 2016-05-12 DIAGNOSIS — R2242 Localized swelling, mass and lump, left lower limb: Secondary | ICD-10-CM

## 2016-05-12 NOTE — Therapy (Signed)
[name] [place], [place], [address] Phone: [phone]   Fax:  [phone]  Physical Therapy Treatment  Patient Details  Name: [name] [name] MRN: [id number] Date of Birth: [birth year] Referring Provider: Alvan Dame  Encounter Date: 05/12/2016      PT End of Session - 05/12/16 1139    Visit Number 4   Date for PT Re-Evaluation 06/29/16   PT Start Time 1055   PT Stop Time 1200   PT Time Calculation (min) 65 min      Past Medical History:  Diagnosis Date  . Anxiety disorder   . Arthritis   . Depression   . Flu    01/2017   . GERD (gastroesophageal reflux disease)   . Panic attack    panic attacks   . RA (rheumatoid arthritis) ([place])   . Urinary tract infection    hx of 02/2016   . Vitamin D deficiency     Past Surgical History:  Procedure Laterality Date  . ABDOMINAL HYSTERECTOMY    . ANTERIOR AND POSTERIOR REPAIR WITH SACROSPINOUS FIXATION N/A 12/13/2014   Procedure: ANTERIOR AND POSTERIOR REPAIR WITH SACROSPINOUS LIGAMENT SUSPENSION;  Surgeon: Everlene Farrier, MD;  Location: [facility];  Service: Gynecology;  Laterality: N/A;  . bladder tack surgery     . BRONCHOSCOPY    . COLONOSCOPY    . TOTAL KNEE ARTHROPLASTY Left 04/07/2016   Procedure: LEFT  TOTAL KNEE ARTHROPLASTY;  Surgeon: Paralee Cancel, MD;  Location: [facility];  Service: Orthopedics;  Laterality: Left;    There were no vitals filed for this visit.      Subjective Assessment - 05/12/16 1056    Subjective pt called clinic yesterday worries about swelling and pain but pt is bak at work and admitts over doing. pt arrives today c/o pain and swelling admits to overdoing Friday .   Currently in Pain? Yes   Pain Score 6    Pain Location Knee   Pain Orientation Left            [facility] PT Assessment - 05/12/16 0001      AROM   Left Knee Extension 4  after MT   Left Knee Flexion 116  after MT                      [facility] Adult PT Treatment/Exercise - 05/12/16 0001      Self-Care   Self-Care Other Self-Care Comments  discussed at length ice/elevation and caution against overdo     Knee/Hip Exercises: Stretches   Active Hamstring Stretch Left;3 reps;10 seconds  with belt     Knee/Hip Exercises: Aerobic   Nustep L 3 6 min     Knee/Hip Exercises: Supine   Short Arc Quad Sets Left;2 sets;10 reps;Strengthening  3# focus on quad set   Heel Slides Left;2 sets;10 reps  PTA overpressure for increased ROM   Straight Leg Raises Strengthening;Left;2 sets;10 reps  3# quad set   Other Supine Knee/Hip Exercises bridge with ball 15  TKE focus   Other Supine Knee/Hip Exercises KTC with red tband for TKE 2 set 10     Modalities   Modalities Electrical Stimulation     Electrical Stimulation   Electrical Stimulation Location RT knee   Electrical Stimulation Action IFC   Electrical Stimulation Goals Edema     Vasopneumatic   Number Minutes Vasopneumatic  15 minutes   Vasopnuematic Location  Knee  Vasopneumatic Pressure Medium   Vasopneumatic Temperature  36     Manual Therapy   Manual Therapy Joint mobilization   Passive ROM flex/ext                PT Education - 05/12/16 1138    Education provided Yes   Education Details limiting activity and overdoing, taking time ot ice and elevate btwn activity   Person(s) Educated Patient   Methods Explanation   Comprehension Verbalized understanding          PT Short Term Goals - 05/07/16 1518      PT SHORT TERM GOAL #1   Title independent with initial HEP   Status Achieved           PT Long Term Goals - 04/29/16 1613      PT LONG TERM GOAL #1   Title understand RICE at home   Time 8   Period Weeks   Status New     PT LONG TERM GOAL #2   Title decrease pain 50%   Time 8   Period Weeks   Status New     PT LONG TERM GOAL #3   Title increase AROM to 5-115 degrees flexion   Time 8   Period Weeks   Status New     PT  LONG TERM GOAL #4   Title go up and down stairs step over step   Time 8   Period Weeks   Status New     PT LONG TERM GOAL #5   Title walk 500 feet without assistive device with minimal deviation   Time 8   Period Weeks   Status New               Plan - 05/12/16 1139    Clinical Impression Statement t with increased swelling and decreased TKE ,focus ex on TKE with cuing and MT and pt with decreased paina dincreased ROM after. Explained to pt at length about need to not overdo   PT Next Visit Plan gait/ROM esp ext, quad strength      Patient will benefit from skilled therapeutic intervention in order to improve the following deficits and impairments:  Abnormal gait, Decreased activity tolerance, Decreased balance, Decreased mobility, Decreased strength, Increased edema, Decreased scar mobility, Decreased range of motion, Difficulty walking, Impaired flexibility, Pain  Visit Diagnosis: Acute pain of left knee  Localized swelling, mass and lump, left lower limb  Stiffness of left knee, not elsewhere classified  Difficulty in walking, not elsewhere classified     Problem List Patient Active Problem List   Diagnosis Date Noted  . Status post total left knee replacement 04/07/2016  . Abnormal ECG 03/20/2016  . Pelvic prolapse 12/13/2014  . Alcohol abuse 10/31/2014  . Pre-syncope 10/05/2014  . Dizziness 10/04/2014    PAYSEUR,ANGIE PTA 05/12/2016, 11:41 AM  [address] [place], [place], [address] Phone: [phone]   Fax:  [phone]  Name: [name] [name] MRN: [id number] Date of Birth: 07-01-1945

## 2016-05-13 ENCOUNTER — Ambulatory Visit
Admission: RE | Admit: 2016-05-13 | Discharge: 2016-05-13 | Disposition: A | Discharge status: Home / Self Care | Payer: Medicare HMO | Source: Ambulatory Visit | Attending: Obstetrics and Gynecology | Admitting: Obstetrics and Gynecology

## 2016-05-13 DIAGNOSIS — N6489 Other specified disorders of breast: Secondary | ICD-10-CM | POA: Diagnosis not present

## 2016-05-13 DIAGNOSIS — R922 Inconclusive mammogram: Secondary | ICD-10-CM | POA: Diagnosis not present

## 2016-05-13 DIAGNOSIS — R928 Other abnormal and inconclusive findings on diagnostic imaging of breast: Secondary | ICD-10-CM

## 2016-05-14 ENCOUNTER — Ambulatory Visit: Payer: Medicare HMO | Admitting: Physical Therapy

## 2016-05-14 ENCOUNTER — Encounter: Payer: Self-pay | Admitting: Physical Therapy

## 2016-05-14 DIAGNOSIS — M25562 Pain in left knee: Secondary | ICD-10-CM

## 2016-05-14 DIAGNOSIS — R2242 Localized swelling, mass and lump, left lower limb: Secondary | ICD-10-CM | POA: Diagnosis not present

## 2016-05-14 DIAGNOSIS — R262 Difficulty in walking, not elsewhere classified: Secondary | ICD-10-CM

## 2016-05-14 DIAGNOSIS — M25662 Stiffness of left knee, not elsewhere classified: Secondary | ICD-10-CM | POA: Diagnosis not present

## 2016-05-14 NOTE — Therapy (Signed)
[place], [place], [address] Phone: [phone]   Fax:  [phone]  Physical Therapy Treatment  Patient Details  Name: [name] [name] MRN: [id number] Date of Birth: 06/14/1945 Referring Provider: Alvan Dame  Encounter Date: 05/14/2016      PT End of Session - 05/14/16 3295    Visit Number 5   Date for PT Re-Evaluation 06/29/16   PT Start Time 1350   PT Stop Time 1450   PT Time Calculation (min) 60 min      Past Medical History:  Diagnosis Date  . Anxiety disorder   . Arthritis   . Depression   . Flu    01/2017   . GERD (gastroesophageal reflux disease)   . Panic attack    panic attacks   . RA (rheumatoid arthritis) ([place])   . Urinary tract infection    hx of 02/2016   . Vitamin D deficiency     Past Surgical History:  Procedure Laterality Date  . ABDOMINAL HYSTERECTOMY    . ANTERIOR AND POSTERIOR REPAIR WITH SACROSPINOUS FIXATION N/A 12/13/2014   Procedure: ANTERIOR AND POSTERIOR REPAIR WITH SACROSPINOUS LIGAMENT SUSPENSION;  Surgeon: Everlene Farrier, MD;  Location: [facility];  Service: Gynecology;  Laterality: N/A;  . bladder tack surgery     . BRONCHOSCOPY    . COLONOSCOPY    . TOTAL KNEE ARTHROPLASTY Left 04/07/2016   Procedure: LEFT  TOTAL KNEE ARTHROPLASTY;  Surgeon: Paralee Cancel, MD;  Location: [facility];  Service: Orthopedics;  Laterality: Left;    There were no vitals filed for this visit.      Subjective Assessment - 05/14/16 1352    Subjective doing better, just not sleeping at night   Currently in Pain? Yes   Pain Score 3    Pain Location Knee   Pain Orientation Left                         [facility] Adult PT Treatment/Exercise - 05/14/16 0001      Knee/Hip Exercises: Aerobic   Recumbent Bike 6 min   Nustep L 5 6 min LE only     Knee/Hip Exercises: Machines for Strengthening   Cybex Leg Press 20# Left leg only 2 sets 10 ( PTA ASSIST)  30# calf  raises 2 sets 15   Other Machine cable leg press down 30# 2 sets 15     Knee/Hip Exercises: Standing   Other Standing Knee Exercises TKE with ball on wall 2 sets 15   Other Standing Knee Exercises green tabnd TKE 2 sets 15     Knee/Hip Exercises: Seated   Long Arc Quad Left;3 sets;10 reps;Weights   Long Arc Quad Weight 3 lbs.   Other Seated Knee/Hip Exercises HS curl red tband 2 sets 15     Electrical Stimulation   Electrical Stimulation Location RT knee   Electrical Stimulation Action premod  ant/post knee   Electrical Stimulation Goals Edema;Pain     Vasopneumatic   Number Minutes Vasopneumatic  15 minutes   Vasopnuematic Location  Knee   Vasopneumatic Pressure Medium   Vasopneumatic Temperature  36     Manual Therapy   Manual Therapy Passive ROM;Joint mobilization   Joint Mobilization flex/ext   Passive ROM flex/ext                  PT Short Term Goals - 05/07/16 1884  PT SHORT TERM GOAL #1   Title independent with initial HEP   Status Achieved           PT Long Term Goals - 05/14/16 1437      PT LONG TERM GOAL #1   Title understand RICE at home   Status Achieved     PT LONG TERM GOAL #2   Title decrease pain 50%   Status On-going     PT LONG TERM GOAL #3   Title increase AROM to 5-115 degrees flexion   Status On-going     PT LONG TERM GOAL #4   Title go up and down stairs step over step   Status On-going     PT LONG TERM GOAL #5   Title walk 500 feet without assistive device with minimal deviation   Status On-going               Plan - 05/14/16 1412    Clinical Impression Statement weak quad with decreased VMO. Painful RT knee so limited with machines and bilateral ex. Improved ROM but still walks with slight knee flexion. Progressing with goals   PT Next Visit Plan WRITE MD note      Patient will benefit from skilled therapeutic intervention in order to improve the following deficits and impairments:  Abnormal gait,  Decreased activity tolerance, Decreased balance, Decreased mobility, Decreased strength, Increased edema, Decreased scar mobility, Decreased range of motion, Difficulty walking, Impaired flexibility, Pain  Visit Diagnosis: Acute pain of left knee  Localized swelling, mass and lump, left lower limb  Stiffness of left knee, not elsewhere classified  Difficulty in walking, not elsewhere classified     Problem List Patient Active Problem List   Diagnosis Date Noted  . Status post total left knee replacement 04/07/2016  . Abnormal ECG 03/20/2016  . Pelvic prolapse 12/13/2014  . Alcohol abuse 10/31/2014  . Pre-syncope 10/05/2014  . Dizziness 10/04/2014    PAYSEUR,ANGIE PTA 05/14/2016, 2:39 PM  [place] [address] [address] [place], [place], [address] Phone: [phone]   Fax:  [phone]  Name: AAHNA ROSSA MRN: [id number] Date of Birth: [birth year]

## 2016-05-19 ENCOUNTER — Encounter: Payer: Self-pay | Admitting: Physical Therapy

## 2016-05-19 ENCOUNTER — Ambulatory Visit: Payer: Medicare HMO | Attending: Orthopedic Surgery | Admitting: Physical Therapy

## 2016-05-19 DIAGNOSIS — R262 Difficulty in walking, not elsewhere classified: Secondary | ICD-10-CM | POA: Diagnosis not present

## 2016-05-19 DIAGNOSIS — R2242 Localized swelling, mass and lump, left lower limb: Secondary | ICD-10-CM | POA: Insufficient documentation

## 2016-05-19 DIAGNOSIS — M25562 Pain in left knee: Secondary | ICD-10-CM | POA: Diagnosis not present

## 2016-05-19 DIAGNOSIS — M25662 Stiffness of left knee, not elsewhere classified: Secondary | ICD-10-CM | POA: Insufficient documentation

## 2016-05-19 NOTE — Therapy (Signed)
[place], [place], [address] Phone: [phone]   Fax:  [phone]  Physical Therapy Treatment  Patient Details  Name: [name] [name] MRN: [id number] Date of Birth: 11/28/1945 Referring Provider: Alvan Dame  Encounter Date: 05/19/2016      PT End of Session - 05/19/16 1108    Visit Number 6   Date for PT Re-Evaluation 06/29/16   PT Start Time 1010   PT Stop Time 1120   PT Time Calculation (min) 70 min      Past Medical History:  Diagnosis Date  . Anxiety disorder   . Arthritis   . Depression   . Flu    01/2017   . GERD (gastroesophageal reflux disease)   . Panic attack    panic attacks   . RA (rheumatoid arthritis) ([place])   . Urinary tract infection    hx of 02/2016   . Vitamin D deficiency     Past Surgical History:  Procedure Laterality Date  . ABDOMINAL HYSTERECTOMY    . ANTERIOR AND POSTERIOR REPAIR WITH SACROSPINOUS FIXATION N/A 12/13/2014   Procedure: ANTERIOR AND POSTERIOR REPAIR WITH SACROSPINOUS LIGAMENT SUSPENSION;  Surgeon: Everlene Farrier, MD;  Location: [facility];  Service: Gynecology;  Laterality: N/A;  . bladder tack surgery     . BRONCHOSCOPY    . COLONOSCOPY    . TOTAL KNEE ARTHROPLASTY Left 04/07/2016   Procedure: LEFT  TOTAL KNEE ARTHROPLASTY;  Surgeon: Paralee Cancel, MD;  Location: [facility];  Service: Orthopedics;  Laterality: Left;    There were no vitals filed for this visit.      Subjective Assessment - 05/19/16 1015    Subjective nights are just so bad, seeing MD thursday   Currently in Pain? Yes   Pain Score 3    Pain Location Knee   Pain Orientation Left            [facility] PT Assessment - 05/19/16 0001      AROM   Left Knee Extension 5   Left Knee Flexion 118                     [facility] Adult PT Treatment/Exercise - 05/19/16 0001      Ambulation/Gait   Gait Comments worked on gait with cuing for increased flexion with swing     Knee/Hip Exercises: Aerobic   Recumbent Bike 6 min   Nustep L 5 6 min LE only     Knee/Hip Exercises: Machines for Strengthening   Cybex Knee Extension 5# assistance up ,hold 3 sec and lower 2 sets 10     Knee/Hip Exercises: Standing   Other Standing Knee Exercises HHA marching fwd and back 3#  3# 8 inch box tap fwd and SW   Other Standing Knee Exercises HHA side stepping 3#     Electrical Stimulation   Electrical Stimulation Location RT knee   Electrical Stimulation Action premod   Electrical Stimulation Goals Edema;Pain     Vasopneumatic   Number Minutes Vasopneumatic  15 minutes   Vasopnuematic Location  Knee   Vasopneumatic Pressure Medium   Vasopneumatic Temperature  36     Manual Therapy   Manual Therapy Passive ROM;Joint mobilization                  PT Short Term Goals - 05/07/16 1518      PT SHORT TERM GOAL #1   Title independent with initial HEP  Status Achieved           PT Long Term Goals - 05/19/16 1107      PT LONG TERM GOAL #2   Title decrease pain 50%   Status On-going     PT LONG TERM GOAL #3   Title increase AROM to 5-115 degrees flexion   Baseline 5-118   Status Achieved     PT LONG TERM GOAL #4   Title go up and down stairs step over step   Status On-going     PT LONG TERM GOAL #5   Title walk 500 feet without assistive device with minimal deviation   Status On-going               Plan - 05/19/16 1109    Clinical Impression Statement pt very  worried about swelling and tightness and will discuss with MD on Thursday . Pt with multi gait deficiets that require cuing. Progressing with ROm but weakness continues.   PT Next Visit Plan MD note sent with pt      Patient will benefit from skilled therapeutic intervention in order to improve the following deficits and impairments:  Abnormal gait, Decreased activity tolerance, Decreased balance, Decreased mobility, Decreased strength, Increased edema, Decreased scar  mobility, Decreased range of motion, Difficulty walking, Impaired flexibility, Pain  Visit Diagnosis: Acute pain of left knee  Localized swelling, mass and lump, left lower limb  Stiffness of left knee, not elsewhere classified  Difficulty in walking, not elsewhere classified     Problem List Patient Active Problem List   Diagnosis Date Noted  . Status post total left knee replacement 04/07/2016  . Abnormal ECG 03/20/2016  . Pelvic prolapse 12/13/2014  . Alcohol abuse 10/31/2014  . Pre-syncope 10/05/2014  . Dizziness 10/04/2014    [name] [name],ANGIE PTA 05/19/2016, 11:11 AM  [name] [place], [place], [address] Phone: [phone]   Fax:  [phone]  Name: [name] [name] MRN: [id number] Date of Birth: 19-Jun-1945

## 2016-05-21 DIAGNOSIS — Z96652 Presence of left artificial knee joint: Secondary | ICD-10-CM | POA: Diagnosis not present

## 2016-05-22 ENCOUNTER — Ambulatory Visit: Payer: Medicare HMO | Admitting: Physical Therapy

## 2016-05-22 ENCOUNTER — Encounter: Payer: Self-pay | Admitting: Physical Therapy

## 2016-05-22 DIAGNOSIS — M25562 Pain in left knee: Secondary | ICD-10-CM | POA: Diagnosis not present

## 2016-05-22 DIAGNOSIS — M25662 Stiffness of left knee, not elsewhere classified: Secondary | ICD-10-CM | POA: Diagnosis not present

## 2016-05-22 DIAGNOSIS — R262 Difficulty in walking, not elsewhere classified: Secondary | ICD-10-CM | POA: Diagnosis not present

## 2016-05-22 DIAGNOSIS — R2242 Localized swelling, mass and lump, left lower limb: Secondary | ICD-10-CM

## 2016-05-22 NOTE — Therapy (Signed)
[place], [place], [address] Phone: [phone]   Fax:  [phone]  Physical Therapy Treatment  Patient Details  Name: [name] [name] MRN: [id number] Date of Birth: Nov 29, 1945 Referring Provider: Alvan Dame  Encounter Date: 05/22/2016      PT End of Session - 05/22/16 1052    Visit Number 7   Date for PT Re-Evaluation 06/29/16   PT Start Time 1010   PT Stop Time 1100   PT Time Calculation (min) 50 min   Activity Tolerance Patient tolerated treatment well      Past Medical History:  Diagnosis Date  . Anxiety disorder   . Arthritis   . Depression   . Flu    01/2017   . GERD (gastroesophageal reflux disease)   . Panic attack    panic attacks   . RA (rheumatoid arthritis) ([place])   . Urinary tract infection    hx of 02/2016   . Vitamin D deficiency     Past Surgical History:  Procedure Laterality Date  . ABDOMINAL HYSTERECTOMY    . ANTERIOR AND POSTERIOR REPAIR WITH SACROSPINOUS FIXATION N/A 12/13/2014   Procedure: ANTERIOR AND POSTERIOR REPAIR WITH SACROSPINOUS LIGAMENT SUSPENSION;  Surgeon: Everlene Farrier, MD;  Location: [facility];  Service: Gynecology;  Laterality: N/A;  . bladder tack surgery     . BRONCHOSCOPY    . COLONOSCOPY    . TOTAL KNEE ARTHROPLASTY Left 04/07/2016   Procedure: LEFT  TOTAL KNEE ARTHROPLASTY;  Surgeon: Paralee Cancel, MD;  Location: [facility];  Service: Orthopedics;  Laterality: Left;    There were no vitals filed for this visit.      Subjective Assessment - 05/22/16 1011    Subjective Nights are improving just waking up with increased stiffness. She is more relieved after visiting with her MD yesterday and he told her everything looks great.    Currently in Pain? Yes   Pain Score 3    Pain Location Knee   Pain Orientation Left   Pain Descriptors / Indicators Aching;Tightness   Pain Type Acute pain   Pain Relieving Factors rest and motrin                          [facility] Adult PT Treatment/Exercise - 05/22/16 0001      Ambulation/Gait   Gait Comments worked on gait with cuing for increased flexion with swing, Forward and backwards      Knee/Hip Exercises: Aerobic   Recumbent Bike 6 min   Nustep L 6 6 min LE only     Knee/Hip Exercises: Machines for Strengthening   Cybex Knee Extension 5# assistance up ,hold 3 sec and lower 2 sets 10     Knee/Hip Exercises: Standing   Terminal Knee Extension Limitations L knee 2x10 with green ball   Other Standing Knee Exercises HHA marching fwd and back 3#   Other Standing Knee Exercises HHA forward and side stepping 3# over foam roll                  PT Short Term Goals - 05/07/16 1518      PT SHORT TERM GOAL #1   Title independent with initial HEP   Status Achieved           PT Long Term Goals - 05/19/16 1107      PT LONG TERM GOAL #2   Title decrease pain 50%  Status On-going     PT LONG TERM GOAL #3   Title increase AROM to 5-115 degrees flexion   Baseline 5-118   Status Achieved     PT LONG TERM GOAL #4   Title go up and down stairs step over step   Status On-going     PT LONG TERM GOAL #5   Title walk 500 feet without assistive device with minimal deviation   Status On-going               Plan - 05/22/16 1053    Clinical Impression Statement Pt was less concerned after visiting MD yesterday. Pt still required mod VCs for marching. Pt got faitgue with lateral and forward stepping and required cues for correction of form.    PT Next Visit Plan Continue with strenghtneing exercises and gait to promote flexion during swing.       Patient will benefit from skilled therapeutic intervention in order to improve the following deficits and impairments:  Abnormal gait, Decreased activity tolerance, Decreased balance, Decreased mobility, Decreased strength, Increased edema, Decreased scar mobility, Decreased range of motion, Difficulty  walking, Impaired flexibility, Pain  Visit Diagnosis: Acute pain of left knee  Stiffness of left knee, not elsewhere classified  Localized swelling, mass and lump, left lower limb  Difficulty in walking, not elsewhere classified     Problem List Patient Active Problem List   Diagnosis Date Noted  . Status post total left knee replacement 04/07/2016  . Abnormal ECG 03/20/2016  . Pelvic prolapse 12/13/2014  . Alcohol abuse 10/31/2014  . Pre-syncope 10/05/2014  . Dizziness 10/04/2014    [name] [name] SPTA 05/22/2016, 11:00 AM  [place] [address] [place], [place], [address] Phone: [phone]   Fax:  [phone]  Name: [name] [name] MRN: [id number] Date of Birth: October 30, 1945

## 2016-05-26 ENCOUNTER — Encounter: Payer: Self-pay | Admitting: Physical Therapy

## 2016-05-26 ENCOUNTER — Ambulatory Visit: Payer: Medicare HMO | Admitting: Physical Therapy

## 2016-05-26 DIAGNOSIS — M25562 Pain in left knee: Secondary | ICD-10-CM

## 2016-05-26 DIAGNOSIS — M25662 Stiffness of left knee, not elsewhere classified: Secondary | ICD-10-CM

## 2016-05-26 DIAGNOSIS — R262 Difficulty in walking, not elsewhere classified: Secondary | ICD-10-CM

## 2016-05-26 DIAGNOSIS — R2242 Localized swelling, mass and lump, left lower limb: Secondary | ICD-10-CM | POA: Diagnosis not present

## 2016-05-26 NOTE — Therapy (Signed)
[place], [place], [address] Phone: [phone]   Fax:  [phone]  Physical Therapy Treatment  Patient Details  Name: [name] [name] MRN: [id number] Date of Birth: 11-07-1945 Referring Provider: Alvan Dame  Encounter Date: 05/26/2016      PT End of Session - 05/26/16 1153    Visit Number 8   Date for PT Re-Evaluation 06/29/16   PT Start Time 1100   PT Stop Time 1150   PT Time Calculation (min) 50 min   Activity Tolerance Patient tolerated treatment well      Past Medical History:  Diagnosis Date  . Anxiety disorder   . Arthritis   . Depression   . Flu    01/2017   . GERD (gastroesophageal reflux disease)   . Panic attack    panic attacks   . RA (rheumatoid arthritis) ([place])   . Urinary tract infection    hx of 02/2016   . Vitamin D deficiency     Past Surgical History:  Procedure Laterality Date  . ABDOMINAL HYSTERECTOMY    . ANTERIOR AND POSTERIOR REPAIR WITH SACROSPINOUS FIXATION N/A 12/13/2014   Procedure: ANTERIOR AND POSTERIOR REPAIR WITH SACROSPINOUS LIGAMENT SUSPENSION;  Surgeon: Everlene Farrier, MD;  Location: [facility];  Service: Gynecology;  Laterality: N/A;  . bladder tack surgery     . BRONCHOSCOPY    . COLONOSCOPY    . TOTAL KNEE ARTHROPLASTY Left 04/07/2016   Procedure: LEFT  TOTAL KNEE ARTHROPLASTY;  Surgeon: Paralee Cancel, MD;  Location: [facility];  Service: Orthopedics;  Laterality: Left;    There were no vitals filed for this visit.      Subjective Assessment - 05/26/16 1139    Subjective Patient reported she did not sleep well last night and in the morning she has a lot of difficulty straightening her L knee out.    Currently in Pain? Yes   Pain Score 3    Pain Location Knee   Pain Orientation Left   Pain Descriptors / Indicators Aching;Tightness   Pain Type Acute pain                         [facility] Adult PT Treatment/Exercise - 05/26/16 0001       Knee/Hip Exercises: Aerobic   Elliptical 6 min x I 6 , R 3. 3 back 3 forward     Knee/Hip Exercises: Machines for Strengthening   Cybex Leg Press Left leg only #20 3x10      Knee/Hip Exercises: Standing   Terminal Knee Extension Limitations L knee 3x 15 with red theraband   Lateral Step Up Left;10 reps;Step Height: 6"   Forward Step Up Left;3 sets;10 reps;Step Height: 6"   Step Down Left;10 reps;3 sets   Other Standing Knee Exercises HHA marching Fwd and lateral over rolls x 3   Other Standing Knee Exercises Treadmill pushes forward and backwards 3 x 8 reps     Vasopneumatic   Number Minutes Vasopneumatic  15 minutes   Vasopnuematic Location  Knee   Vasopneumatic Pressure Medium     Manual Therapy   Joint Mobilization Patella joint mobs all directions                   PT Short Term Goals - 05/07/16 1518      PT SHORT TERM GOAL #1   Title independent with initial HEP   Status Achieved  PT Long Term Goals - 05/19/16 1107      PT LONG TERM GOAL #2   Title decrease pain 50%   Status On-going     PT LONG TERM GOAL #3   Title increase AROM to 5-115 degrees flexion   Baseline 5-118   Status Achieved     PT LONG TERM GOAL #4   Title go up and down stairs step over step   Status On-going     PT LONG TERM GOAL #5   Title walk 500 feet without assistive device with minimal deviation   Status On-going               Plan - 05/26/16 1154    Clinical Impression Statement Pt was reserved doing some of the exercises but handled them well. Pt still required VC to bend knee forward  while marching over the foam rolls.    PT Next Visit Plan Continue incoperating new strengthening exercises while promoting flexion during swing phase.       Patient will benefit from skilled therapeutic intervention in order to improve the following deficits and impairments:  Abnormal gait, Decreased activity tolerance, Decreased balance, Decreased mobility,  Decreased strength, Increased edema, Decreased scar mobility, Decreased range of motion, Difficulty walking, Impaired flexibility, Pain  Visit Diagnosis: Acute pain of left knee  Stiffness of left knee, not elsewhere classified  Localized swelling, mass and lump, left lower limb  Difficulty in walking, not elsewhere classified     Problem List Patient Active Problem List   Diagnosis Date Noted  . Status post total left knee replacement 04/07/2016  . Abnormal ECG 03/20/2016  . Pelvic prolapse 12/13/2014  . Alcohol abuse 10/31/2014  . Pre-syncope 10/05/2014  . Dizziness 10/04/2014    Alan Mulder SPTA 05/26/2016, 12:14 PM  [place] [address] [place], [place], [address] Phone: [phone]   Fax:  [phone]  Name: MAELY CLEMENTS MRN: [id number] Date of Birth: 1945/02/19

## 2016-05-28 ENCOUNTER — Ambulatory Visit: Payer: Medicare HMO | Admitting: Physical Therapy

## 2016-05-28 ENCOUNTER — Encounter: Payer: Self-pay | Admitting: Physical Therapy

## 2016-05-28 DIAGNOSIS — R2242 Localized swelling, mass and lump, left lower limb: Secondary | ICD-10-CM

## 2016-05-28 DIAGNOSIS — R262 Difficulty in walking, not elsewhere classified: Secondary | ICD-10-CM

## 2016-05-28 DIAGNOSIS — M25662 Stiffness of left knee, not elsewhere classified: Secondary | ICD-10-CM

## 2016-05-28 DIAGNOSIS — M25562 Pain in left knee: Secondary | ICD-10-CM

## 2016-05-28 NOTE — Therapy (Signed)
[place], [place], [address] Phone: [phone]   Fax:  [phone]  Physical Therapy Treatment  Patient Details  Name: [name] [name] MRN: [id number] Date of Birth: September 27, 1945 Referring Provider: Alvan Dame  Encounter Date: 05/28/2016      PT End of Session - 05/28/16 1309    Visit Number 9   Date for PT Re-Evaluation 06/29/16   PT Start Time 1055   PT Stop Time 1150   PT Time Calculation (min) 55 min   Activity Tolerance Patient tolerated treatment well   Behavior During Therapy [facility] for tasks assessed/performed      Past Medical History:  Diagnosis Date  . Anxiety disorder   . Arthritis   . Depression   . Flu    01/2017   . GERD (gastroesophageal reflux disease)   . Panic attack    panic attacks   . RA (rheumatoid arthritis) ([place])   . Urinary tract infection    hx of 02/2016   . Vitamin D deficiency     Past Surgical History:  Procedure Laterality Date  . ABDOMINAL HYSTERECTOMY    . ANTERIOR AND POSTERIOR REPAIR WITH SACROSPINOUS FIXATION N/A 12/13/2014   Procedure: ANTERIOR AND POSTERIOR REPAIR WITH SACROSPINOUS LIGAMENT SUSPENSION;  Surgeon: Everlene Farrier, MD;  Location: [facility];  Service: Gynecology;  Laterality: N/A;  . bladder tack surgery     . BRONCHOSCOPY    . COLONOSCOPY    . TOTAL KNEE ARTHROPLASTY Left 04/07/2016   Procedure: LEFT  TOTAL KNEE ARTHROPLASTY;  Surgeon: Paralee Cancel, MD;  Location[facility];  Service: Orthopedics;  Laterality: Left;    There were no vitals filed for this visit.      Subjective Assessment - 05/28/16 1256    Subjective Patient reported that she was sore and wore out after last treatment. She also reported she did a lot yesterday.    Currently in Pain? Yes   Pain Score 3    Pain Location Knee   Pain Orientation Left   Pain Descriptors / Indicators Aching;Tightness   Pain Type Acute pain                          [facility] Adult PT Treatment/Exercise - 05/28/16 0001      Ambulation/Gait   Stairs Yes   Stairs Assistance 5: Supervision   Stair Management Technique One rail Left   Number of Stairs 24   Height of Stairs 6     Knee/Hip Exercises: Aerobic   Elliptical 4 min I 6;R 3, 2 forward 2 backward   Nustep L 4 6 min LE only     Knee/Hip Exercises: Standing   Terminal Knee Extension Limitations L knee 3x 15 with red theraband   Other Standing Knee Exercises HHA marching Fwd over rolls x 3     Vasopneumatic   Number Minutes Vasopneumatic  15 minutes   Vasopnuematic Location  Knee   Vasopneumatic Pressure Medium   Vasopneumatic Temperature  36     Manual Therapy   Manual Therapy Passive ROM;Joint mobilization   Manual therapy comments Belt Mobs 2 sets x 10                   PT Short Term Goals - 05/07/16 1518      PT SHORT TERM GOAL #1   Title independent with initial HEP   Status Achieved  PT Long Term Goals - 05/28/16 1316      PT LONG TERM GOAL #3   Title increase AROM to 5-115 degrees flexion   Baseline 3-123   Status Achieved     PT LONG TERM GOAL #4   Title go up and down stairs step over step   Baseline with handrail on the left.    Status Achieved               Plan - 05/28/16 1311    Clinical Impression Statement Pt handled the stairs very well, she only feels pain when she is stepping down with her right foot. She still need cues to step straight over the foam rolls. Her ROM increased to 3-123 degrees of knee flexion.    PT Next Visit Plan Incoperate SLS on the Left, and continue with strengthening.       Patient will benefit from skilled therapeutic intervention in order to improve the following deficits and impairments:  Abnormal gait, Decreased activity tolerance, Decreased balance, Decreased mobility, Decreased strength, Increased edema, Decreased scar mobility, Decreased range of motion, Difficulty walking, Impaired flexibility,  Pain  Visit Diagnosis: Acute pain of left knee  Stiffness of left knee, not elsewhere classified  Localized swelling, mass and lump, left lower limb  Difficulty in walking, not elsewhere classified     Problem List Patient Active Problem List   Diagnosis Date Noted  . Status post total left knee replacement 04/07/2016  . Abnormal ECG 03/20/2016  . Pelvic prolapse 12/13/2014  . Alcohol abuse 10/31/2014  . Pre-syncope 10/05/2014  . Dizziness 10/04/2014    Alan Mulder SPTA 05/28/2016, 1:20 PM  [place] [address] [place], [place], [address] Phone: [phone]   Fax:  [phone]  Name: [name] [name] MRN: [id number] Date of Birth: 1946/02/05

## 2016-06-02 ENCOUNTER — Ambulatory Visit: Payer: Medicare HMO | Admitting: Physical Therapy

## 2016-06-02 ENCOUNTER — Encounter: Payer: Self-pay | Admitting: Physical Therapy

## 2016-06-02 DIAGNOSIS — R262 Difficulty in walking, not elsewhere classified: Secondary | ICD-10-CM | POA: Diagnosis not present

## 2016-06-02 DIAGNOSIS — M25662 Stiffness of left knee, not elsewhere classified: Secondary | ICD-10-CM | POA: Diagnosis not present

## 2016-06-02 DIAGNOSIS — M25562 Pain in left knee: Secondary | ICD-10-CM | POA: Diagnosis not present

## 2016-06-02 DIAGNOSIS — R2242 Localized swelling, mass and lump, left lower limb: Secondary | ICD-10-CM

## 2016-06-02 NOTE — Therapy (Signed)
[address] [place], [place], [address] Phone: [phone]   Fax:  [phone]  Physical Therapy Treatment  Patient Details  Name: [name] [name] MRN: [id number] Date of Birth: 01-Sep-1945 Referring Provider: Alvan Dame  Encounter Date: 06/02/2016      PT End of Session - 06/02/16 1044    Visit Number 10   Date for PT Re-Evaluation 06/29/16   PT Start Time 1005   PT Stop Time 1100   PT Time Calculation (min) 55 min   Activity Tolerance Patient tolerated treatment well   Behavior During Therapy [facility] for tasks assessed/performed      Past Medical History:  Diagnosis Date  . Anxiety disorder   . Arthritis   . Depression   . Flu    01/2017   . GERD (gastroesophageal reflux disease)   . Panic attack    panic attacks   . RA (rheumatoid arthritis) ([place])   . Urinary tract infection    hx of 02/2016   . Vitamin D deficiency     Past Surgical History:  Procedure Laterality Date  . ABDOMINAL HYSTERECTOMY    . ANTERIOR AND POSTERIOR REPAIR WITH SACROSPINOUS FIXATION N/A 12/13/2014   Procedure: ANTERIOR AND POSTERIOR REPAIR WITH SACROSPINOUS LIGAMENT SUSPENSION;  Surgeon: Everlene Farrier, MD;  Location: [facility];  Service: Gynecology;  Laterality: N/A;  . bladder tack surgery     . BRONCHOSCOPY    . COLONOSCOPY    . TOTAL KNEE ARTHROPLASTY Left 04/07/2016   Procedure: LEFT  TOTAL KNEE ARTHROPLASTY;  Surgeon: Paralee Cancel, MD;  Location: [facility];  Service: Orthopedics;  Laterality: Left;    There were no vitals filed for this visit.      Subjective Assessment - 06/02/16 1007    Subjective Patient reports she is sleeping better at night but her knee is still very stiff in the mornings.   Currently in Pain? Yes   Pain Score 2    Pain Location Knee   Pain Orientation Left   Pain Descriptors / Indicators Tightness                         [facility] Adult PT Treatment/Exercise - 06/02/16 0001       Knee/Hip Exercises: Aerobic   Elliptical 6 min I 6;R 3, 3 forward 3 backward   Recumbent Bike 5 min     Knee/Hip Exercises: Machines for Strengthening   Cybex Knee Extension 5# assistance up 2 sets 10 L only   Cybex Knee Flexion 20# L only 3 sets x 8   Total Gym Leg Press SL left 3 x 10 20#     Knee/Hip Exercises: Standing   Terminal Knee Extension Limitations L knee 3x 15 with green ball   SLS 4 x 30 sec on airex HHA   Other Standing Knee Exercises HHA marching on airex 3 x 20    Other Standing Knee Exercises lateral stepping with red theraband      Vasopneumatic   Number Minutes Vasopneumatic  15 minutes   Vasopnuematic Location  Knee   Vasopneumatic Pressure Medium   Vasopneumatic Temperature  36                  PT Short Term Goals - 05/07/16 1518      PT SHORT TERM GOAL #1   Title independent with initial HEP   Status Achieved  PT Long Term Goals - 05/28/16 1316      PT LONG TERM GOAL #3   Title increase AROM to 5-115 degrees flexion   Baseline 3-123   Status Achieved     PT LONG TERM GOAL #4   Title go up and down stairs step over step   Baseline with handrail on the left.    Status Achieved               Plan - 06/02/16 1045    Clinical Impression Statement Patient handled the SL exercises very well. She still required assistance with full raising during SL extensions with 5#.  The patient expressed that after this week she has a bike, treadmill and stairs to continue working after discharge. The patient still needed cues to keep her toes and hips pointed straight during lateral band walks.    PT Next Visit Plan Continue with SLS and strengthening. Review what exercises she can do at home after discharge.       Patient will benefit from skilled therapeutic intervention in order to improve the following deficits and impairments:  Abnormal gait, Decreased activity tolerance, Decreased balance, Decreased mobility, Decreased  strength, Increased edema, Decreased scar mobility, Decreased range of motion, Difficulty walking, Impaired flexibility, Pain  Visit Diagnosis: Acute pain of left knee  Stiffness of left knee, not elsewhere classified  Localized swelling, mass and lump, left lower limb  Difficulty in walking, not elsewhere classified     Problem List Patient Active Problem List   Diagnosis Date Noted  . Status post total left knee replacement 04/07/2016  . Abnormal ECG 03/20/2016  . Pelvic prolapse 12/13/2014  . Alcohol abuse 10/31/2014  . Pre-syncope 10/05/2014  . Dizziness 10/04/2014    Alan Mulder SPTA 06/02/2016, 10:57 AM  [address] [place], [place], [address] Phone: [phone]   Fax:  [phone]  Name: [name] [name] MRN: [id number] Date of Birth: 22-Feb-1945

## 2016-06-04 ENCOUNTER — Ambulatory Visit: Payer: Medicare HMO | Admitting: Physical Therapy

## 2016-06-04 ENCOUNTER — Encounter: Payer: Self-pay | Admitting: Physical Therapy

## 2016-06-04 DIAGNOSIS — R2242 Localized swelling, mass and lump, left lower limb: Secondary | ICD-10-CM

## 2016-06-04 DIAGNOSIS — M25662 Stiffness of left knee, not elsewhere classified: Secondary | ICD-10-CM

## 2016-06-04 DIAGNOSIS — M25562 Pain in left knee: Secondary | ICD-10-CM | POA: Diagnosis not present

## 2016-06-04 DIAGNOSIS — R262 Difficulty in walking, not elsewhere classified: Secondary | ICD-10-CM | POA: Diagnosis not present

## 2016-06-04 NOTE — Therapy (Addendum)
[place], [place], [address] Phone: [phone]   Fax:  [phone]  Physical Therapy Treatment  Patient Details  Name: [name] [name] MRN: [id number] Date of Birth: [birth year] Referring Provider: Alvan Dame  Encounter Date: 06/04/2016      PT End of Session - 06/04/16 1134    Visit Number 11   Date for PT Re-Evaluation 06/29/16   PT Start Time 1055   PT Stop Time 1150   PT Time Calculation (min) 55 min   Activity Tolerance Patient tolerated treatment well   Behavior During Therapy [facility] for tasks assessed/performed      Past Medical History:  Diagnosis Date  . Anxiety disorder   . Arthritis   . Depression   . Flu    01/2017   . GERD (gastroesophageal reflux disease)   . Panic attack    panic attacks   . RA (rheumatoid arthritis) ([place])   . Urinary tract infection    hx of 02/2016   . Vitamin D deficiency     Past Surgical History:  Procedure Laterality Date  . ABDOMINAL HYSTERECTOMY    . ANTERIOR AND POSTERIOR REPAIR WITH SACROSPINOUS FIXATION N/A 12/13/2014   Procedure: ANTERIOR AND POSTERIOR REPAIR WITH SACROSPINOUS LIGAMENT SUSPENSION;  Surgeon: Everlene Farrier, MD;  Location: [facility];  Service: Gynecology;  Laterality: N/A;  . bladder tack surgery     . BRONCHOSCOPY    . COLONOSCOPY    . TOTAL KNEE ARTHROPLASTY Left 04/07/2016   Procedure: LEFT  TOTAL KNEE ARTHROPLASTY;  Surgeon: Paralee Cancel, MD;  Location: [facility];  Service: Orthopedics;  Laterality: Left;    There were no vitals filed for this visit.      Subjective Assessment - 06/04/16 1055    Subjective Patient reports that she is doing good and went on a 25 min walk yesterday and felt really good during and after it,    Currently in Pain? Yes   Pain Score 1    Pain Location Knee   Pain Orientation Left   Pain Descriptors / Indicators Tightness                         [facility] Adult PT  Treatment/Exercise - 06/04/16 0001      Knee/Hip Exercises: Aerobic   Elliptical 6 min I 6;R 3, 3 forward 3 backward   Recumbent Bike 6 min     Knee/Hip Exercises: Standing   Terminal Knee Extension Limitations L knee 3x 15 with red tband     Knee/Hip Exercises: Seated   Long Arc Quad Left;3 sets;10 reps;Weights   Long Arc Quad Weight 3 lbs.   Ball Squeeze 3 x 10 green ball   Clamshell with TheraBand Green  3 x 10   Hamstring Curl Strengthening;Left;3 sets;10 reps   Hamstring Limitations red tband 3 x 10     Vasopneumatic   Number Minutes Vasopneumatic  15 minutes   Vasopnuematic Location  Knee   Vasopneumatic Pressure Medium   Vasopneumatic Temperature  36                  PT Short Term Goals - 05/07/16 1518      PT SHORT TERM GOAL #1   Title independent with initial HEP   Status Achieved           PT Long Term Goals - 06/04/16 1152      PT LONG TERM  GOAL #2   Status Achieved     PT LONG TERM GOAL #5   Status Achieved               Plan - 06-15-16 1145    Clinical Impression Statement Patient handled all exercises that were in her HEP very well. She understood the proper way to do all the exercises, and was given her HEP and therabands at end of the session.    PT Next Visit Plan discharged today      Patient will benefit from skilled therapeutic intervention in order to improve the following deficits and impairments:  Abnormal gait, Decreased activity tolerance, Decreased balance, Decreased mobility, Decreased strength, Increased edema, Decreased scar mobility, Decreased range of motion, Difficulty walking, Impaired flexibility, Pain  Visit Diagnosis: Acute pain of left knee  Stiffness of left knee, not elsewhere classified  Localized swelling, mass and lump, left lower limb  Difficulty in walking, not elsewhere classified       G-Codes - 06-15-16 1138    Functional Assessment Tool Used (Outpatient Only) foto 46% limitation    Functional Limitation Mobility: Walking and moving around   Mobility: Walking and Moving Around Current Status [phone]) At least 40 percent but less than 60 percent impaired, limited or restricted   Mobility: Walking and Moving Around Goal Status [phone]) At least 40 percent but less than 60 percent impaired, limited or restricted   Mobility: Walking and Moving Around Discharge Status [phone]) At least 40 percent but less than 60 percent impaired, limited or restricted      Problem List Patient Active Problem List   Diagnosis Date Noted  . Status post total left knee replacement 04/07/2016  . Abnormal ECG 03/20/2016  . Pelvic prolapse 12/13/2014  . Alcohol abuse 10/31/2014  . Pre-syncope 10/05/2014  . Dizziness 10/04/2014   PHYSICAL THERAPY DISCHARGE SUMMARY   Plan: Patient agrees to discharge.  Patient goals were met. Patient is being discharged due to meeting the stated rehab goals.  ?????      Alan Mulder SPTA 15-Jun-2016, 11:53 AM  [address] [place], [place], [address] Phone: [phone]   Fax:  [phone]  Name: [name] [name] MRN: [id number] Date of Birth: [birth year]

## 2016-07-20 NOTE — Addendum Note (Signed)
Addendum  created 07/20/16 1122 by Myrtie Soman, MD   Sign clinical note

## 2016-07-26 DIAGNOSIS — J209 Acute bronchitis, unspecified: Secondary | ICD-10-CM | POA: Diagnosis not present

## 2016-09-01 DIAGNOSIS — L299 Pruritus, unspecified: Secondary | ICD-10-CM | POA: Diagnosis not present

## 2016-10-06 DIAGNOSIS — F4323 Adjustment disorder with mixed anxiety and depressed mood: Secondary | ICD-10-CM | POA: Diagnosis not present

## 2016-10-14 DIAGNOSIS — F4323 Adjustment disorder with mixed anxiety and depressed mood: Secondary | ICD-10-CM | POA: Diagnosis not present

## 2016-10-21 DIAGNOSIS — F4323 Adjustment disorder with mixed anxiety and depressed mood: Secondary | ICD-10-CM | POA: Diagnosis not present

## 2016-10-28 DIAGNOSIS — F4323 Adjustment disorder with mixed anxiety and depressed mood: Secondary | ICD-10-CM | POA: Diagnosis not present

## 2016-11-06 ENCOUNTER — Emergency Department (HOSPITAL_BASED_OUTPATIENT_CLINIC_OR_DEPARTMENT_OTHER)
Admission: EM | Admit: 2016-11-06 | Discharge: 2016-11-06 | Disposition: A | Discharge status: Home / Self Care | Payer: Medicare HMO | Attending: Emergency Medicine | Admitting: Emergency Medicine

## 2016-11-06 ENCOUNTER — Encounter (HOSPITAL_BASED_OUTPATIENT_CLINIC_OR_DEPARTMENT_OTHER): Payer: Self-pay | Admitting: *Deleted

## 2016-11-06 DIAGNOSIS — Z79899 Other long term (current) drug therapy: Secondary | ICD-10-CM | POA: Insufficient documentation

## 2016-11-06 DIAGNOSIS — Y999 Unspecified external cause status: Secondary | ICD-10-CM | POA: Insufficient documentation

## 2016-11-06 DIAGNOSIS — T148XXA Other injury of unspecified body region, initial encounter: Secondary | ICD-10-CM

## 2016-11-06 DIAGNOSIS — S0012XA Contusion of left eyelid and periocular area, initial encounter: Secondary | ICD-10-CM | POA: Insufficient documentation

## 2016-11-06 DIAGNOSIS — Y929 Unspecified place or not applicable: Secondary | ICD-10-CM | POA: Insufficient documentation

## 2016-11-06 DIAGNOSIS — Z87891 Personal history of nicotine dependence: Secondary | ICD-10-CM | POA: Insufficient documentation

## 2016-11-06 DIAGNOSIS — W19XXXS Unspecified fall, sequela: Secondary | ICD-10-CM

## 2016-11-06 DIAGNOSIS — W0110XA Fall on same level from slipping, tripping and stumbling with subsequent striking against unspecified object, initial encounter: Secondary | ICD-10-CM | POA: Diagnosis not present

## 2016-11-06 DIAGNOSIS — Y9301 Activity, walking, marching and hiking: Secondary | ICD-10-CM | POA: Insufficient documentation

## 2016-11-06 DIAGNOSIS — S0011XA Contusion of right eyelid and periocular area, initial encounter: Secondary | ICD-10-CM | POA: Diagnosis not present

## 2016-11-06 DIAGNOSIS — S0993XA Unspecified injury of face, initial encounter: Secondary | ICD-10-CM | POA: Diagnosis present

## 2016-11-06 DIAGNOSIS — Z96652 Presence of left artificial knee joint: Secondary | ICD-10-CM | POA: Diagnosis not present

## 2016-11-06 NOTE — ED Triage Notes (Signed)
Pt reports slip and fall Friday night, hitting head sidewalk, lo loc. Pt has edema and contusions surrounding both eyes, states she has not sought medical care until today.

## 2016-11-06 NOTE — ED Provider Notes (Signed)
[facility]PT [facility] Provider Note   CSN: [id number] Arrival date & time: 11/06/16  1004     History   Chief Complaint Chief Complaint  Patient presents with  . Fall    HPI [name] [name] is a 71 y.o. female.  HPI  [name] [name] is a 71yo female with a history of alcohol abuse, panic disorder who presents to the Emergency Department for evaluation of right eyebrow swelling following a fall last Friday. Patient states that tripped and fell in the rain as she was walking outside of a bar 7d ago with her friends. She states that she had about 4 drinks that night and remembers slipping on her flip-flop and landing on her right hand, shoulder and face. She denies LOC. She did not seek medical care until this time, she is concerned today because her right upper eye swelling has not improved since the fall despite icing the eye multiple times a day. She has also applied apple cider vinegar to the bruising on her face. She denies pain to the eye or face. No drainage or bleeding since the injury. She denies diplopia, double vision, epistaxis, bleeding from her ears, neck pain, headache, numbness, weakness, difficulty with speech, light-headedness, fever, nausea, vomiting. She has some mild soreness over her right hand which is worsened when making a fist. Patient is not on any blood thinners.   Past Medical History:  Diagnosis Date  . Anxiety disorder   . Arthritis   . Depression   . Flu    01/2017   . GERD (gastroesophageal reflux disease)   . Panic attack    panic attacks   . RA (rheumatoid arthritis) ([place])   . Urinary tract infection    hx of 02/2016   . Vitamin D deficiency     Patient Active Problem List   Diagnosis Date Noted  . Status post total left knee replacement 04/07/2016  . Abnormal ECG 03/20/2016  . Pelvic prolapse 12/13/2014  . Alcohol abuse 10/31/2014  . Pre-syncope 10/05/2014  . Dizziness 10/04/2014    Past Surgical History:  Procedure Laterality Date    . ABDOMINAL HYSTERECTOMY    . ANTERIOR AND POSTERIOR REPAIR WITH SACROSPINOUS FIXATION N/A 12/13/2014   Procedure: ANTERIOR AND POSTERIOR REPAIR WITH SACROSPINOUS LIGAMENT SUSPENSION;  Surgeon: Everlene Farrier, MD;  Location: [facility];  Service: Gynecology;  Laterality: N/A;  . bladder tack surgery     . BRONCHOSCOPY    . COLONOSCOPY    . TOTAL KNEE ARTHROPLASTY Left 04/07/2016   Procedure: LEFT  TOTAL KNEE ARTHROPLASTY;  Surgeon: Paralee Cancel, MD;  Location: [facility];  Service: Orthopedics;  Laterality: Left;    OB History    No data available       Home Medications    Prior to Admission medications   Medication Sig Start Date End Date Taking? Authorizing Provider  citalopram (CELEXA) 20 MG tablet Take 20 mg by mouth daily. 10/26/14   [provider]  docusate sodium (COLACE) 100 MG capsule Take 1 capsule (100 mg total) by mouth 2 (two) times daily. 04/07/16   Danae Orleans, PA-C  ferrous sulfate (FERROUSUL) 325 (65 FE) MG tablet Take 1 tablet (325 mg total) by mouth 3 (three) times daily with meals. 04/07/16   Danae Orleans, PA-C  methocarbamol (ROBAXIN) 500 MG tablet Take 1 tablet (500 mg total) by mouth every 6 (six) hours as needed for muscle spasms. 04/07/16   Danae Orleans, PA-C  oxyCODONE (OXY IR/ROXICODONE) 5 MG immediate release  tablet Take 1-2 tablets (5-10 mg total) by mouth every 4 (four) hours as needed for moderate pain. 04/08/16   Danae Orleans, PA-C  polyethylene glycol (MIRALAX / GLYCOLAX) packet Take 17 g by mouth 2 (two) times daily. 04/07/16   Danae Orleans, PA-C  sodium chloride (OCEAN) 0.65 % SOLN nasal spray Place 1 spray into both nostrils every 6 (six) hours as needed for congestion.    [provider]    Family History Family History  Problem Relation Age of Onset  . Heart attack Mother   . Breast cancer Sister   . Breast cancer Maternal Aunt     Social History Social History  Substance Use Topics  . Smoking status: Former Research scientist (life sciences)  .  Smokeless tobacco: Never Used  . Alcohol use Yes     Comment: 2-3 glasses of wine some days "I probably drink  more than I should"     Allergies   Clinoril [sulindac] and Voltaren [diclofenac sodium]   Review of Systems Review of Systems  Constitutional: Negative for chills, fatigue and fever.  HENT: Negative for ear discharge, ear pain, nosebleeds and trouble swallowing.   Eyes: Negative for pain, redness and visual disturbance.  Respiratory: Negative for shortness of breath.   Cardiovascular: Negative for chest pain.  Gastrointestinal: Negative for abdominal pain, nausea and vomiting.  Musculoskeletal: Positive for arthralgias (left knee, chronic) and joint swelling (swelling over right eyebrow). Negative for neck pain and neck stiffness.  Skin: Positive for color change (bruising on face) and wound. Negative for rash.  Neurological: Negative for weakness, light-headedness, numbness and headaches.  Psychiatric/Behavioral: Negative for agitation.     Physical Exam Updated Vital Signs BP (!) 143/80 (BP Location: Right Arm)   Pulse 75   Temp 98.5 F (36.9 C) (Oral)   Resp 14   Ht 5\' 8"  (1.727 m)   Wt 68.9 kg (152 lb)   SpO2 98%   BMI 23.11 kg/m   Physical Exam  Constitutional: She is oriented to person, place, and time. She appears well-developed and well-nourished. No distress.  HENT:  Head: Normocephalic and atraumatic.  Mouth/Throat: Oropharynx is clear and moist.  Significant bruising noted on the face with well circumscribed swelling over the right eyebrow (see picture below.) No point tenderness to palpation over the facial bones. No battle sign. TM with good cone of light, no hemotympanum. No epistaxis or evidence of nasal septum hematoma.   Eyes: Pupils are equal, round, and reactive to light. EOM are normal. Right eye exhibits no discharge. Left eye exhibits no discharge.  Neck: Normal range of motion. Neck supple.  No tenderness to palpation of the cervical  vertebrae or paraspinal muscles of the cervical spine.   Cardiovascular: Normal rate and regular rhythm.  Exam reveals no friction rub.   No murmur heard. Pulmonary/Chest: Effort normal and breath sounds normal. No respiratory distress. She has no wheezes. She has no rales.  Abdominal: Soft. Bowel sounds are normal. There is no tenderness.  Musculoskeletal: Normal range of motion.  Neurological: She is alert and oriented to person, place, and time.  Mental Status:  Alert, oriented, thought content appropriate, able to give a coherent history. Speech fluent without evidence of aphasia. Able to follow 2 step commands without difficulty.  Cranial Nerves:  II:  Peripheral visual fields grossly normal, pupils equal, round, reactive to light III,IV, VI: ptosis not present, extra-ocular motions intact bilaterally  V,VII: smile symmetric, facial light touch sensation equal VIII: hearing grossly normal to voice  X: uvula elevates symmetrically  XI: bilateral shoulder shrug symmetric and strong XII: midline tongue extension without fassiculations Motor:  Normal tone. 5/5 in upper and lower extremities bilaterally including strong and equal grip strength and dorsiflexion/plantar flexion Sensory: Pinprick and light touch normal in all extremities.  Deep Tendon Reflexes: 2+ and symmetric in the biceps and patella Cerebellar: normal finger-to-nose with bilateral upper extremities Gait: normal gait and balance CV: distal pulses palpable throughout    Skin: Skin is warm and dry. She is not diaphoretic.  Psychiatric: She has a normal mood and affect. Her behavior is normal.  Nursing note and vitals reviewed.        ED Treatments / Results  Labs (all labs ordered are listed, but only abnormal results are displayed) Labs Reviewed - No data to display  EKG  EKG Interpretation None       Radiology No results found.  Procedures Procedures (including critical care time)  Medications  Ordered in ED Medications - No data to display   Initial Impression / Assessment and Plan / ED Course  I have reviewed the triage vital signs and the nursing notes.  Pertinent labs & imaging results that were available during my care of the patient were reviewed by me and considered in my medical decision making (see chart for details).    Patient with hematoma over her right eyebrow and significant bruising on her face following mechanical fall which occurred a week ago. Do not suspect intracranial bleed given normal neurological exam and no complaint of nausea, vomiting, headache, confusion, visual disturbance. No neck pain or stiffness to suggest cervical spine injury. No battle sign. No erythema, purulence over the eyelid to suggest infection.   Patient's vital signs are stable Blood pressure (!) 143/80, pulse 75, temperature 98.5 F (36.9 C), temperature source Oral, resp. rate 14, height 5\' 8"  (1.727 m), weight 68.9 kg (152 lb), SpO2 98 %. She has been counseled to apply heat over the eye and that it may take several weeks for hematoma to completely resolve. Discussed strict return precautions and patient agrees and voices understanding.    Discussed this patient with Dr. Alvino Chapel who saw the patient and agrees to the above plan.   Final Clinical Impressions(s) / ED Diagnoses   Final diagnoses:  Fall, sequela  Hematoma    New Prescriptions Discharge Medication List as of 11/06/2016 12:40 PM       Glyn Ade, PA-C 11/06/16 1250    Davonna Belling, MD 11/06/16 1447

## 2016-11-06 NOTE — ED Notes (Signed)
ED Provider at bedside. 

## 2016-11-06 NOTE — Discharge Instructions (Signed)
It was a pleasure to take care of you today. You have a hematoma over your right eyebrow. You may apply heat to the area for swelling, know that this will take a few weeks to resolve.   Please follow up with your primary doctor concerning your blood pressure. It was mildly elevated today.  Please return to the ER if you develop vision changes, nausea/vomiting that will not stop, new numbness or weakness. Also return for any new or worsening symptoms.

## 2016-11-17 DIAGNOSIS — S0081XA Abrasion of other part of head, initial encounter: Secondary | ICD-10-CM | POA: Diagnosis not present

## 2016-11-17 DIAGNOSIS — S0541XA Penetrating wound of orbit with or without foreign body, right eye, initial encounter: Secondary | ICD-10-CM | POA: Diagnosis not present

## 2016-11-24 DIAGNOSIS — H02842 Edema of right lower eyelid: Secondary | ICD-10-CM | POA: Diagnosis not present

## 2016-11-30 DIAGNOSIS — S0083XD Contusion of other part of head, subsequent encounter: Secondary | ICD-10-CM | POA: Diagnosis not present

## 2016-12-23 DIAGNOSIS — R58 Hemorrhage, not elsewhere classified: Secondary | ICD-10-CM | POA: Diagnosis not present

## 2016-12-23 DIAGNOSIS — Z23 Encounter for immunization: Secondary | ICD-10-CM | POA: Diagnosis not present

## 2016-12-23 DIAGNOSIS — R600 Localized edema: Secondary | ICD-10-CM | POA: Diagnosis not present

## 2017-02-19 DIAGNOSIS — L259 Unspecified contact dermatitis, unspecified cause: Secondary | ICD-10-CM | POA: Diagnosis not present

## 2017-03-09 DIAGNOSIS — M25462 Effusion, left knee: Secondary | ICD-10-CM | POA: Diagnosis not present

## 2017-03-09 DIAGNOSIS — M170 Bilateral primary osteoarthritis of knee: Secondary | ICD-10-CM | POA: Diagnosis not present

## 2017-03-09 DIAGNOSIS — M25519 Pain in unspecified shoulder: Secondary | ICD-10-CM | POA: Diagnosis not present

## 2017-03-09 DIAGNOSIS — Z13820 Encounter for screening for osteoporosis: Secondary | ICD-10-CM | POA: Diagnosis not present

## 2017-03-09 DIAGNOSIS — M0579 Rheumatoid arthritis with rheumatoid factor of multiple sites without organ or systems involvement: Secondary | ICD-10-CM | POA: Diagnosis not present

## 2017-03-09 DIAGNOSIS — M79632 Pain in left forearm: Secondary | ICD-10-CM | POA: Diagnosis not present

## 2017-03-09 DIAGNOSIS — M19012 Primary osteoarthritis, left shoulder: Secondary | ICD-10-CM | POA: Diagnosis not present

## 2017-03-09 DIAGNOSIS — M79602 Pain in left arm: Secondary | ICD-10-CM | POA: Diagnosis not present

## 2017-04-01 DIAGNOSIS — L219 Seborrheic dermatitis, unspecified: Secondary | ICD-10-CM | POA: Diagnosis not present

## 2017-04-01 DIAGNOSIS — D229 Melanocytic nevi, unspecified: Secondary | ICD-10-CM | POA: Diagnosis not present

## 2017-04-07 DIAGNOSIS — Z0000 Encounter for general adult medical examination without abnormal findings: Secondary | ICD-10-CM | POA: Diagnosis not present

## 2017-04-07 DIAGNOSIS — E559 Vitamin D deficiency, unspecified: Secondary | ICD-10-CM | POA: Diagnosis not present

## 2017-04-07 DIAGNOSIS — Z13220 Encounter for screening for lipoid disorders: Secondary | ICD-10-CM | POA: Diagnosis not present

## 2017-04-07 DIAGNOSIS — E7800 Pure hypercholesterolemia, unspecified: Secondary | ICD-10-CM | POA: Diagnosis not present

## 2017-04-14 DIAGNOSIS — M069 Rheumatoid arthritis, unspecified: Secondary | ICD-10-CM | POA: Diagnosis not present

## 2017-04-14 DIAGNOSIS — E559 Vitamin D deficiency, unspecified: Secondary | ICD-10-CM | POA: Diagnosis not present

## 2017-04-14 DIAGNOSIS — F329 Major depressive disorder, single episode, unspecified: Secondary | ICD-10-CM | POA: Diagnosis not present

## 2017-04-14 DIAGNOSIS — J309 Allergic rhinitis, unspecified: Secondary | ICD-10-CM | POA: Diagnosis not present

## 2017-04-14 DIAGNOSIS — Z0000 Encounter for general adult medical examination without abnormal findings: Secondary | ICD-10-CM | POA: Diagnosis not present

## 2017-04-14 DIAGNOSIS — R9431 Abnormal electrocardiogram [ECG] [EKG]: Secondary | ICD-10-CM | POA: Diagnosis not present

## 2017-04-14 DIAGNOSIS — H6980 Other specified disorders of Eustachian tube, unspecified ear: Secondary | ICD-10-CM | POA: Diagnosis not present

## 2017-04-14 DIAGNOSIS — Z8349 Family history of other endocrine, nutritional and metabolic diseases: Secondary | ICD-10-CM | POA: Diagnosis not present

## 2017-04-14 DIAGNOSIS — N390 Urinary tract infection, site not specified: Secondary | ICD-10-CM | POA: Diagnosis not present

## 2017-04-15 DIAGNOSIS — J00 Acute nasopharyngitis [common cold]: Secondary | ICD-10-CM | POA: Diagnosis not present

## 2017-04-18 DIAGNOSIS — J111 Influenza due to unidentified influenza virus with other respiratory manifestations: Secondary | ICD-10-CM | POA: Diagnosis not present

## 2017-04-20 DIAGNOSIS — J019 Acute sinusitis, unspecified: Secondary | ICD-10-CM | POA: Diagnosis not present

## 2017-04-20 DIAGNOSIS — J029 Acute pharyngitis, unspecified: Secondary | ICD-10-CM | POA: Diagnosis not present

## 2017-04-20 DIAGNOSIS — J181 Lobar pneumonia, unspecified organism: Secondary | ICD-10-CM | POA: Diagnosis not present

## 2017-04-20 DIAGNOSIS — R05 Cough: Secondary | ICD-10-CM | POA: Diagnosis not present

## 2017-04-20 DIAGNOSIS — J40 Bronchitis, not specified as acute or chronic: Secondary | ICD-10-CM | POA: Diagnosis not present

## 2017-04-22 ENCOUNTER — Emergency Department (HOSPITAL_BASED_OUTPATIENT_CLINIC_OR_DEPARTMENT_OTHER)
Admission: EM | Admit: 2017-04-22 | Discharge: 2017-04-22 | Disposition: A | Discharge status: Home / Self Care | Payer: Medicare HMO | Attending: Emergency Medicine | Admitting: Emergency Medicine

## 2017-04-22 ENCOUNTER — Encounter (HOSPITAL_BASED_OUTPATIENT_CLINIC_OR_DEPARTMENT_OTHER): Payer: Self-pay | Admitting: *Deleted

## 2017-04-22 ENCOUNTER — Other Ambulatory Visit: Payer: Self-pay

## 2017-04-22 ENCOUNTER — Emergency Department (HOSPITAL_BASED_OUTPATIENT_CLINIC_OR_DEPARTMENT_OTHER): Payer: Medicare HMO

## 2017-04-22 DIAGNOSIS — Z96652 Presence of left artificial knee joint: Secondary | ICD-10-CM | POA: Insufficient documentation

## 2017-04-22 DIAGNOSIS — Z87891 Personal history of nicotine dependence: Secondary | ICD-10-CM | POA: Diagnosis not present

## 2017-04-22 DIAGNOSIS — Z79899 Other long term (current) drug therapy: Secondary | ICD-10-CM | POA: Insufficient documentation

## 2017-04-22 DIAGNOSIS — J189 Pneumonia, unspecified organism: Secondary | ICD-10-CM | POA: Diagnosis not present

## 2017-04-22 DIAGNOSIS — R0600 Dyspnea, unspecified: Secondary | ICD-10-CM | POA: Diagnosis not present

## 2017-04-22 DIAGNOSIS — R05 Cough: Secondary | ICD-10-CM | POA: Diagnosis not present

## 2017-04-22 MED ORDER — AZITHROMYCIN 250 MG PO TABS
500.0000 mg | ORAL_TABLET | Freq: Once | ORAL | Status: AC
  Administered 2017-04-22: 500 mg via ORAL
  Filled 2017-04-22: qty 2

## 2017-04-22 MED ORDER — ALBUTEROL SULFATE HFA 108 (90 BASE) MCG/ACT IN AERS
2.0000 | INHALATION_SPRAY | Freq: Once | RESPIRATORY_TRACT | Status: AC
  Administered 2017-04-22: 2 via RESPIRATORY_TRACT
  Filled 2017-04-22: qty 6.7

## 2017-04-22 MED ORDER — AEROCHAMBER PLUS FLO-VU MEDIUM MISC
1.0000 | Freq: Once | Status: AC
  Administered 2017-04-22: 1
  Filled 2017-04-22: qty 1

## 2017-04-22 MED ORDER — IPRATROPIUM-ALBUTEROL 0.5-2.5 (3) MG/3ML IN SOLN
3.0000 mL | Freq: Four times a day (QID) | RESPIRATORY_TRACT | Status: DC
  Administered 2017-04-22: 3 mL via RESPIRATORY_TRACT
  Filled 2017-04-22: qty 3

## 2017-04-22 MED ORDER — AZITHROMYCIN 250 MG PO TABS: 250.0000 mg | ORAL_TABLET | Freq: Every day | ORAL | 0 refills | Status: AC

## 2017-04-22 MED FILL — AZITHROMYCIN 250 MG TABLET: 250 | 4 days supply | Qty: 4 | Fill #0

## 2017-04-22 NOTE — ED Triage Notes (Signed)
Cough and sob. She was diagnosed with pneumonia last week.

## 2017-04-27 DIAGNOSIS — J40 Bronchitis, not specified as acute or chronic: Secondary | ICD-10-CM | POA: Diagnosis not present

## 2017-04-27 DIAGNOSIS — J019 Acute sinusitis, unspecified: Secondary | ICD-10-CM | POA: Diagnosis not present

## 2017-04-27 DIAGNOSIS — R0600 Dyspnea, unspecified: Secondary | ICD-10-CM | POA: Diagnosis not present

## 2017-04-27 DIAGNOSIS — R05 Cough: Secondary | ICD-10-CM | POA: Diagnosis not present

## 2017-04-27 DIAGNOSIS — J029 Acute pharyngitis, unspecified: Secondary | ICD-10-CM | POA: Diagnosis not present

## 2017-04-27 DIAGNOSIS — J181 Lobar pneumonia, unspecified organism: Secondary | ICD-10-CM | POA: Diagnosis not present

## 2017-04-28 DIAGNOSIS — H2513 Age-related nuclear cataract, bilateral: Secondary | ICD-10-CM | POA: Diagnosis not present

## 2017-04-28 DIAGNOSIS — Z79899 Other long term (current) drug therapy: Secondary | ICD-10-CM | POA: Diagnosis not present

## 2017-04-29 NOTE — ED Provider Notes (Signed)
[facility] EMERGENCY DEPARTMENT Provider Note   CSN: [id number] Arrival date & time: 04/22/17  1316     History   Chief Complaint Chief Complaint  Patient presents with  . Cough  . Shortness of Breath    HPI [name] [name] is a 72 y.o. female.  HPI   72 year old female with cough and shortness of breath.  Recently diagnosed with pneumonia.  Reports she was prescribed steroids and Augmentin.  She is only taken this for a couple days.  She feels like symptoms have not improved much.  No fevers.  She feels like she has little energy.  No unusual leg pain or swelling.  Past Medical History:  Diagnosis Date  . Anxiety disorder   . Arthritis   . Depression   . Flu    01/2017   . GERD (gastroesophageal reflux disease)   . Panic attack    panic attacks   . RA (rheumatoid arthritis) ([place])   . Urinary tract infection    hx of 02/2016   . Vitamin D deficiency     Patient Active Problem List   Diagnosis Date Noted  . Status post total left knee replacement 04/07/2016  . Abnormal ECG 03/20/2016  . Pelvic prolapse 12/13/2014  . Alcohol abuse 10/31/2014  . Pre-syncope 10/05/2014  . Dizziness 10/04/2014    Past Surgical History:  Procedure Laterality Date  . ABDOMINAL HYSTERECTOMY    . ANTERIOR AND POSTERIOR REPAIR WITH SACROSPINOUS FIXATION N/A 12/13/2014   Procedure: ANTERIOR AND POSTERIOR REPAIR WITH SACROSPINOUS LIGAMENT SUSPENSION;  Surgeon: Everlene Farrier, MD;  Location: [facility];  Service: Gynecology;  Laterality: N/A;  . bladder tack surgery     . BRONCHOSCOPY    . COLONOSCOPY    . TOTAL KNEE ARTHROPLASTY Left 04/07/2016   Procedure: LEFT  TOTAL KNEE ARTHROPLASTY;  Surgeon: Paralee Cancel, MD;  Location: [facility];  Service: Orthopedics;  Laterality: Left;    OB History    No data available       Home Medications    Prior to Admission medications   Medication Sig Start Date End Date Taking? Authorizing Provider  citalopram (CELEXA) 20 MG tablet  Take 20 mg by mouth daily. 10/26/14   [provider]  docusate sodium (COLACE) 100 MG capsule Take 1 capsule (100 mg total) by mouth 2 (two) times daily. 04/07/16   Danae Orleans, PA-C  ferrous sulfate (FERROUSUL) 325 (65 FE) MG tablet Take 1 tablet (325 mg total) by mouth 3 (three) times daily with meals. 04/07/16   Danae Orleans, PA-C  methocarbamol (ROBAXIN) 500 MG tablet Take 1 tablet (500 mg total) by mouth every 6 (six) hours as needed for muscle spasms. 04/07/16   Danae Orleans, PA-C  oxyCODONE (OXY IR/ROXICODONE) 5 MG immediate release tablet Take 1-2 tablets (5-10 mg total) by mouth every 4 (four) hours as needed for moderate pain. 04/08/16   Danae Orleans, PA-C  polyethylene glycol (MIRALAX / GLYCOLAX) packet Take 17 g by mouth 2 (two) times daily. 04/07/16   Danae Orleans, PA-C  sodium chloride (OCEAN) 0.65 % SOLN nasal spray Place 1 spray into both nostrils every 6 (six) hours as needed for congestion.    [provider]    Family History Family History  Problem Relation Age of Onset  . Heart attack Mother   . Breast cancer Sister   . Breast cancer Maternal Aunt     Social History Social History   Tobacco Use  . Smoking status: Former  Smoker  . Smokeless tobacco: Never Used  Substance Use Topics  . Alcohol use: Yes    Comment: 2-3 glasses of wine some days "I probably drink  more than I should"  . Drug use: No     Allergies   Clinoril [sulindac] and Voltaren [diclofenac sodium]   Review of Systems Review of Systems  All systems reviewed and negative, other than as noted in HPI. Physical Exam Updated Vital Signs BP (!) 154/97   Pulse 78   Temp 98.4 F (36.9 C) (Oral)   Resp (!) 22   Ht 5\' 8"  (1.727 m)   Wt 69.4 kg (153 lb)   SpO2 93%   BMI 23.26 kg/m   Physical Exam  Constitutional: She appears well-developed and well-nourished. No distress.  HENT:  Head: Normocephalic and atraumatic.  Eyes: Conjunctivae are normal. Right eye  exhibits no discharge. Left eye exhibits no discharge.  Neck: Neck supple.  Cardiovascular: Normal rate, regular rhythm and normal heart sounds. Exam reveals no gallop and no friction rub.  No murmur heard. Pulmonary/Chest: Effort normal and breath sounds normal. No respiratory distress.  Abdominal: Soft. She exhibits no distension. There is no tenderness.  Musculoskeletal: She exhibits no edema or tenderness.  Neurological: She is alert.  Skin: Skin is warm and dry.  Psychiatric: She has a normal mood and affect. Her behavior is normal. Thought content normal.  Nursing note and vitals reviewed.    ED Treatments / Results  Labs (all labs ordered are listed, but only abnormal results are displayed) Labs Reviewed - No data to display  EKG  EKG Interpretation None       Radiology No results found.   Dg Chest 2 View  Result Date: 04/22/2017 CLINICAL DATA:  Patient diagnosed with pneumonia 2 days ago. EXAM: CHEST - 2 VIEW COMPARISON:  PA and lateral chest 04/20/2017 and 10/04/2014. CT chest 04/14/2014. FINDINGS: Patchy bilateral airspace disease is worse on the right and unchanged. More dense appearing opacity is seen in the medial aspect of the right upper lobe. Elevation of the right hemidiaphragm is again seen. No pneumothorax or pleural effusion. Heart size is normal. IMPRESSION: No change and right worse than left airspace disease most consistent with pneumonia. Follow-up plain films to clearing are recommended. Electronically Signed   By: Inge Rise M.D.   On: 04/22/2017 13:40    Procedures Procedures (including critical care time)  Medications Ordered in ED Medications  azithromycin (ZITHROMAX) tablet 500 mg (500 mg Oral Given 04/22/17 1356)  albuterol (PROVENTIL HFA;VENTOLIN HFA) 108 (90 Base) MCG/ACT inhaler 2 puff (2 puffs Inhalation Given 04/22/17 1442)  AEROCHAMBER PLUS FLO-VU MEDIUM MISC 1 each (1 each Other Given 04/22/17 1442)     Initial Impression / Assessment  and Plan / ED Course  I have reviewed the triage vital signs and the nursing notes.  Pertinent labs & imaging results that were available during my care of the patient were reviewed by me and considered in my medical decision making (see chart for details).    72 year old female with cough and dyspnea.  Chest x-ray is consistent with pneumonia.  Will add azithromycin.  She is already on steroids.  She was provided with an albuterol inhaler.  Final Clinical Impressions(s) / ED Diagnoses   Final diagnoses:  Community acquired pneumonia, unspecified laterality    ED Discharge Orders        Ordered    azithromycin (ZITHROMAX Z-PAK) 250 MG tablet  Daily     04/22/17  [place]       Virgel Manifold, MD 04/29/17 1453

## 2017-05-20 DIAGNOSIS — Z1212 Encounter for screening for malignant neoplasm of rectum: Secondary | ICD-10-CM | POA: Diagnosis not present

## 2017-05-20 DIAGNOSIS — Z1211 Encounter for screening for malignant neoplasm of colon: Secondary | ICD-10-CM | POA: Diagnosis not present

## 2017-06-24 DIAGNOSIS — R195 Other fecal abnormalities: Secondary | ICD-10-CM | POA: Diagnosis not present

## 2017-06-26 DIAGNOSIS — N3001 Acute cystitis with hematuria: Secondary | ICD-10-CM | POA: Diagnosis not present

## 2017-07-21 DIAGNOSIS — S8002XA Contusion of left knee, initial encounter: Secondary | ICD-10-CM | POA: Diagnosis not present

## 2017-07-21 DIAGNOSIS — M25562 Pain in left knee: Secondary | ICD-10-CM | POA: Diagnosis not present

## 2017-08-09 DIAGNOSIS — D126 Benign neoplasm of colon, unspecified: Secondary | ICD-10-CM | POA: Diagnosis not present

## 2017-08-09 DIAGNOSIS — R195 Other fecal abnormalities: Secondary | ICD-10-CM | POA: Diagnosis not present

## 2017-08-09 DIAGNOSIS — K640 First degree hemorrhoids: Secondary | ICD-10-CM | POA: Diagnosis not present

## 2017-08-09 DIAGNOSIS — K5730 Diverticulosis of large intestine without perforation or abscess without bleeding: Secondary | ICD-10-CM | POA: Diagnosis not present

## 2017-08-11 DIAGNOSIS — D126 Benign neoplasm of colon, unspecified: Secondary | ICD-10-CM | POA: Diagnosis not present

## 2017-09-29 DIAGNOSIS — D122 Benign neoplasm of ascending colon: Secondary | ICD-10-CM | POA: Diagnosis not present

## 2017-09-29 DIAGNOSIS — K5730 Diverticulosis of large intestine without perforation or abscess without bleeding: Secondary | ICD-10-CM | POA: Diagnosis not present

## 2017-09-29 DIAGNOSIS — D126 Benign neoplasm of colon, unspecified: Secondary | ICD-10-CM | POA: Diagnosis not present

## 2017-09-29 DIAGNOSIS — K635 Polyp of colon: Secondary | ICD-10-CM | POA: Diagnosis not present

## 2017-09-29 DIAGNOSIS — M069 Rheumatoid arthritis, unspecified: Secondary | ICD-10-CM | POA: Diagnosis not present

## 2017-09-29 DIAGNOSIS — K562 Volvulus: Secondary | ICD-10-CM | POA: Diagnosis not present

## 2017-10-01 DIAGNOSIS — J069 Acute upper respiratory infection, unspecified: Secondary | ICD-10-CM | POA: Diagnosis not present

## 2017-11-24 DIAGNOSIS — L814 Other melanin hyperpigmentation: Secondary | ICD-10-CM | POA: Diagnosis not present

## 2017-11-24 DIAGNOSIS — L570 Actinic keratosis: Secondary | ICD-10-CM | POA: Diagnosis not present

## 2017-11-29 DIAGNOSIS — M0579 Rheumatoid arthritis with rheumatoid factor of multiple sites without organ or systems involvement: Secondary | ICD-10-CM | POA: Diagnosis not present

## 2017-11-29 DIAGNOSIS — Z8349 Family history of other endocrine, nutritional and metabolic diseases: Secondary | ICD-10-CM | POA: Diagnosis not present

## 2017-11-29 DIAGNOSIS — R7989 Other specified abnormal findings of blood chemistry: Secondary | ICD-10-CM | POA: Diagnosis not present

## 2017-11-29 DIAGNOSIS — Z79899 Other long term (current) drug therapy: Secondary | ICD-10-CM | POA: Diagnosis not present

## 2017-12-06 DIAGNOSIS — M170 Bilateral primary osteoarthritis of knee: Secondary | ICD-10-CM | POA: Diagnosis not present

## 2017-12-06 DIAGNOSIS — Z23 Encounter for immunization: Secondary | ICD-10-CM | POA: Diagnosis not present

## 2017-12-06 DIAGNOSIS — Z13820 Encounter for screening for osteoporosis: Secondary | ICD-10-CM | POA: Diagnosis not present

## 2017-12-06 DIAGNOSIS — M25519 Pain in unspecified shoulder: Secondary | ICD-10-CM | POA: Diagnosis not present

## 2017-12-06 DIAGNOSIS — M810 Age-related osteoporosis without current pathological fracture: Secondary | ICD-10-CM | POA: Diagnosis not present

## 2017-12-06 DIAGNOSIS — M0579 Rheumatoid arthritis with rheumatoid factor of multiple sites without organ or systems involvement: Secondary | ICD-10-CM | POA: Diagnosis not present

## 2018-02-22 IMAGING — CT CT ANGIO CHEST
2 of 6 series · 18 of 36 positions shown · IV contrast (ISOVUE 370)
Comparison: PA and lateral chest 10/04/2014.

CLINICAL DATA: Syncope, shortness of breath and tachycardia. Status
post knee replacement 04/07/2016.

EXAM:
CT ANGIOGRAPHY CHEST WITH CONTRAST
TECHNIQUE: Multidetector CT imaging of the chest was performed using the
standard protocol during bolus administration of intravenous
contrast. Multiplanar CT image reconstructions and MIPs were
obtained to evaluate the vascular anatomy.
CONTRAST:  100 ml Isovue 370.

[Series 5: coronal mpr · coronal · 0.47mm/px · 1 of 130 slices shown]
[im 65/130  mediastinal]
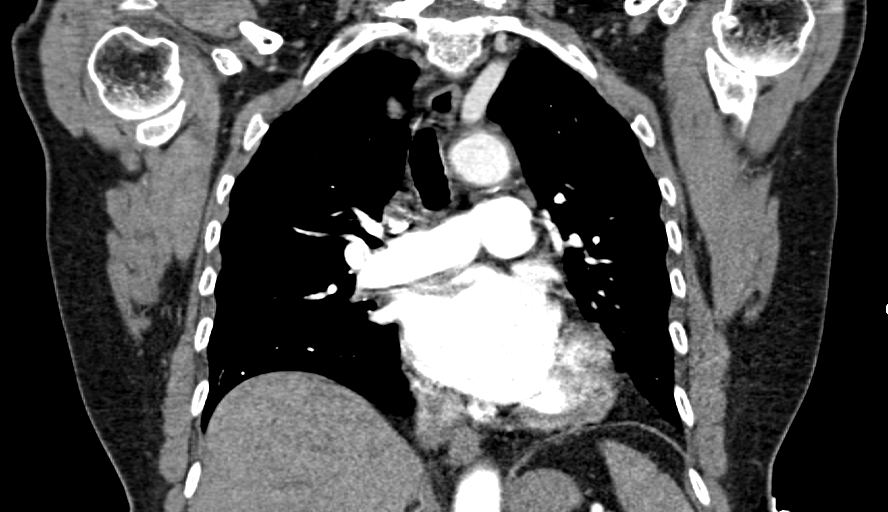

[Series 10: thins for pacs · axial · 0.83mm/px · z∈[+1402,+1620]mm · 17 of 243 slices shown]
[im 13/243  lung]
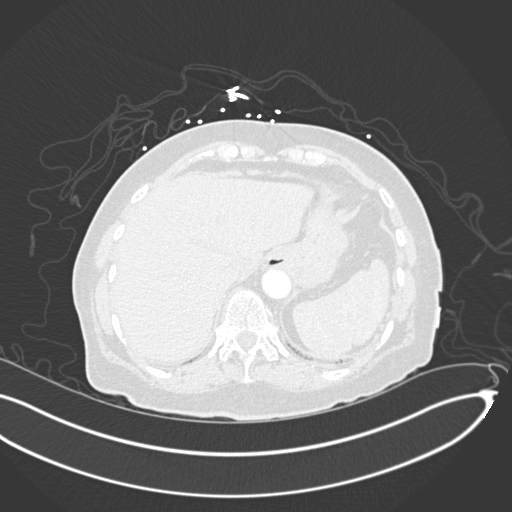
[im 25/243  mediastinal]
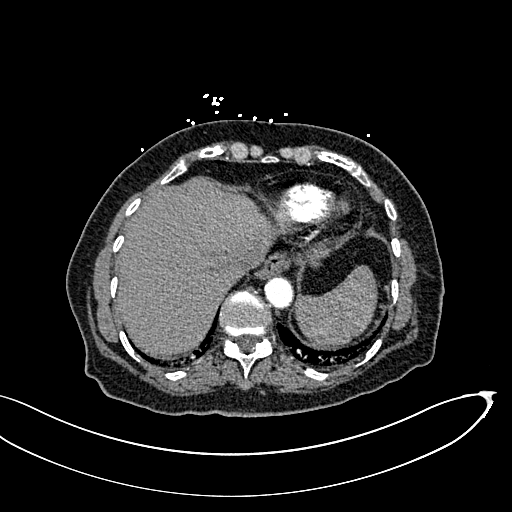
[im 37/243  lung]
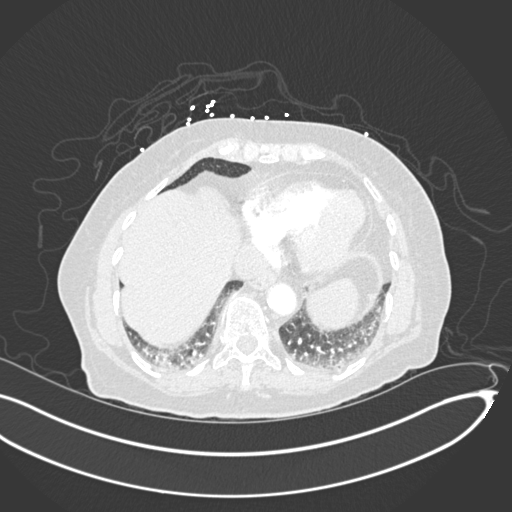
[im 49/243  mediastinal]
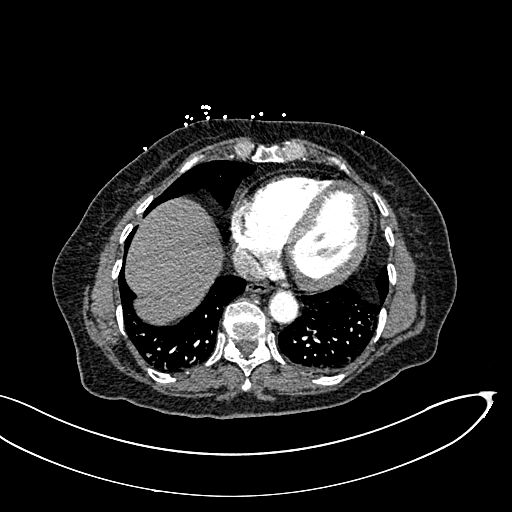
[im 73/243  lung]
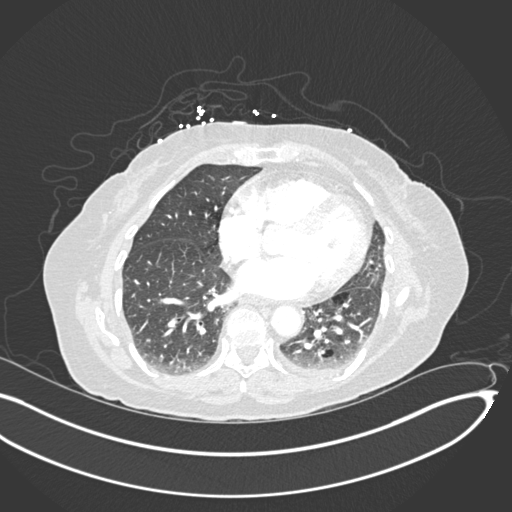
[im 85/243  mediastinal]
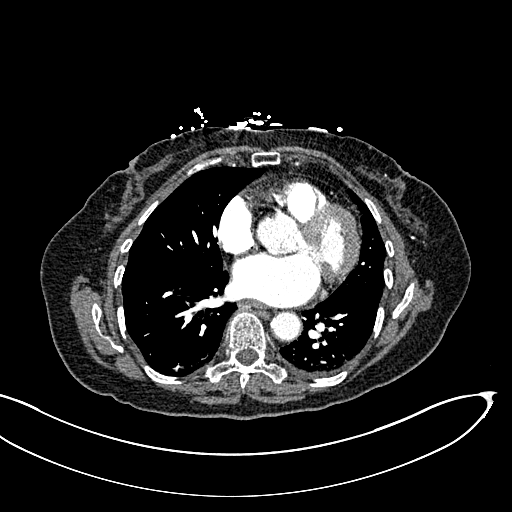
[im 97/243  lung]
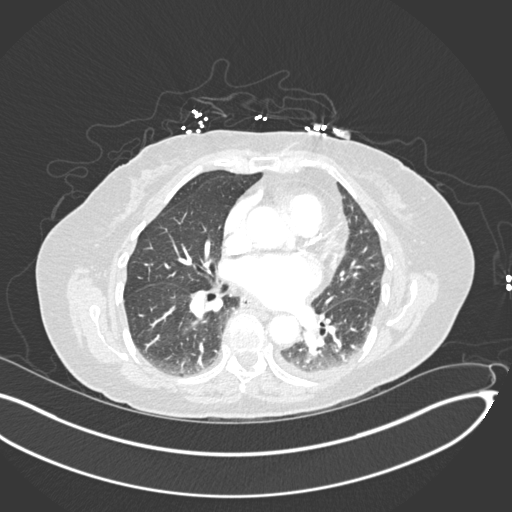
[im 109/243  mediastinal]
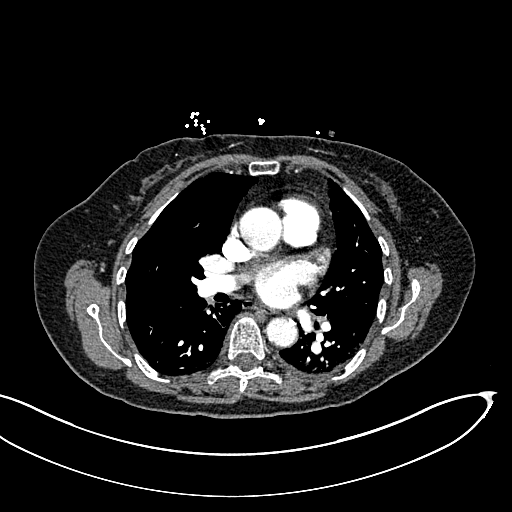
[im 122/243  lung]
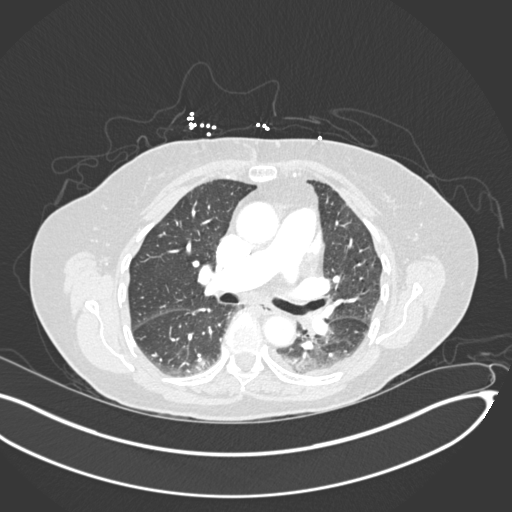
[im 134/243  mediastinal]
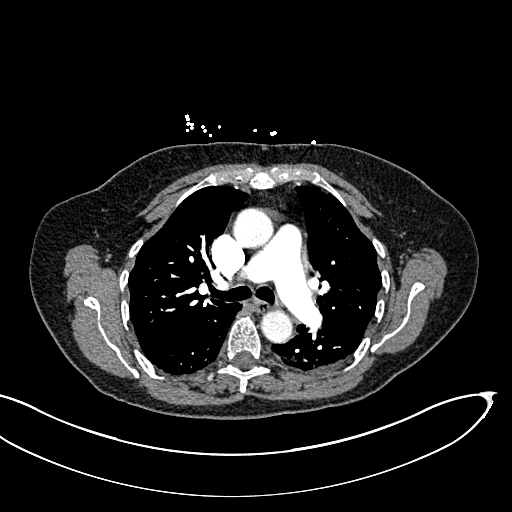
[im 146/243  lung]
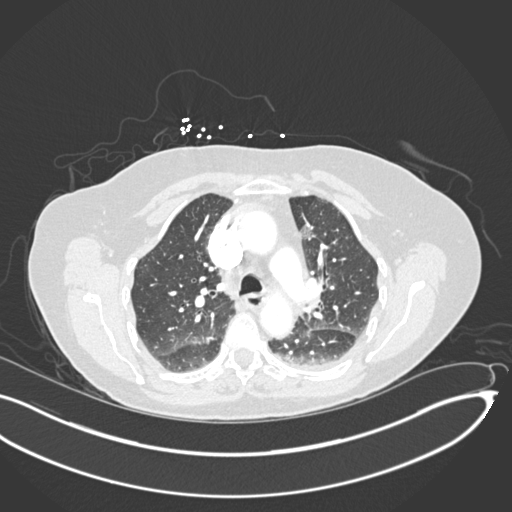
[im 158/243  mediastinal]
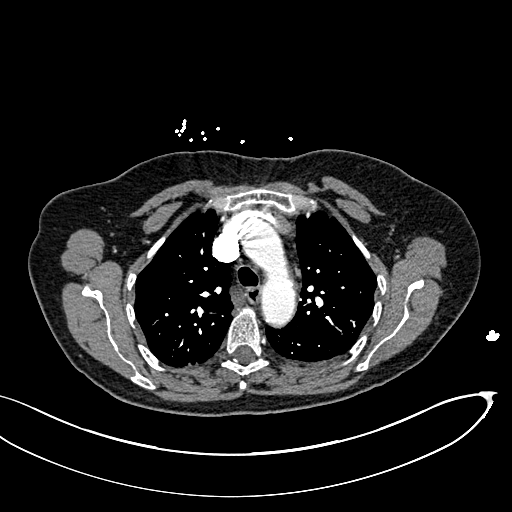
[im 170/243  lung]
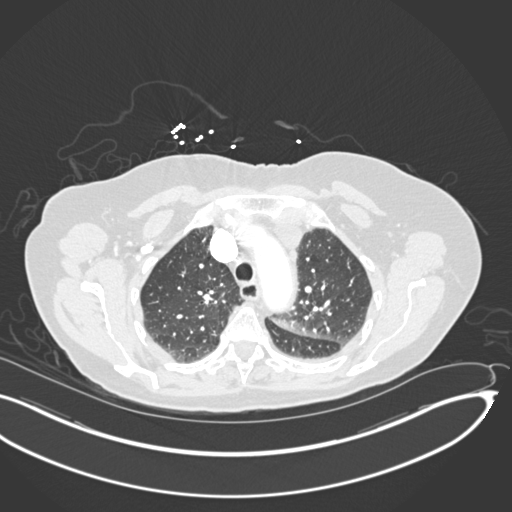
[im 194/243  mediastinal]
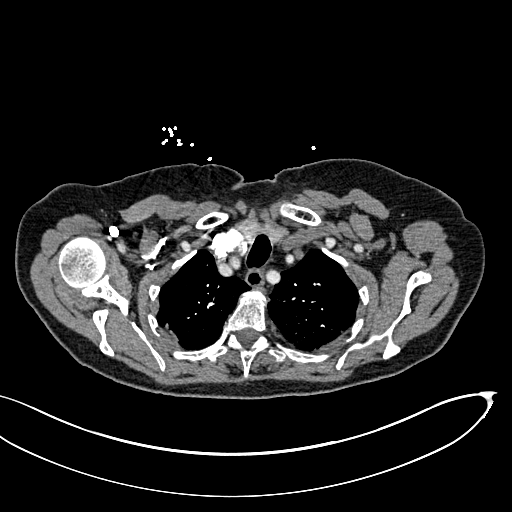
[im 206/243  lung]
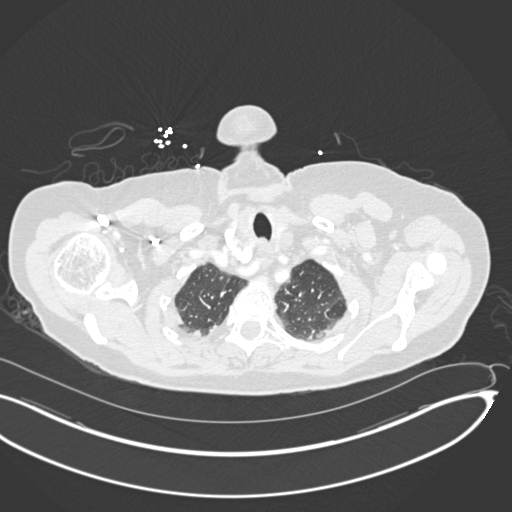
[im 218/243  mediastinal]
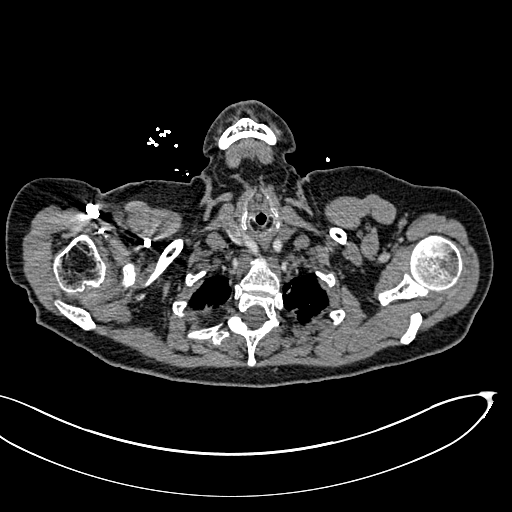
[im 230/243  lung]
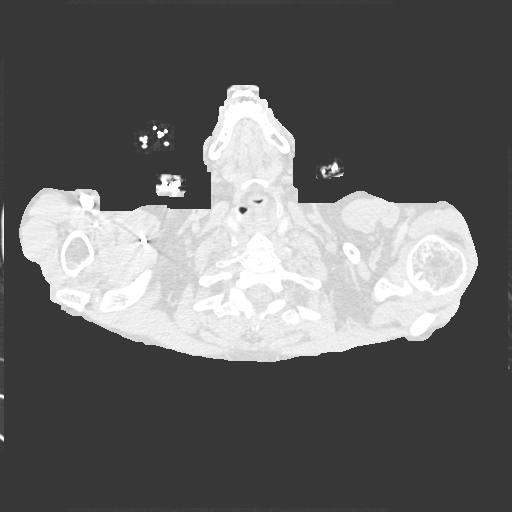

[18 of 36 positions shown; findings below may reference images not displayed]

FINDINGS: Cardiovascular: No pulmonary embolus is identified. There is
calcific aortic and coronary atherosclerosis. No pericardial
effusion. Heart size is upper normal.

Mediastinum/Nodes: Very small hiatal hernia is seen. No
lymphadenopathy. Thyroid gland is unremarkable.

Lungs/Pleura: No pleural effusion. Mild dependent atelectasis is
seen. Lungs are otherwise clear.

Upper Abdomen: No acute abnormality.

Musculoskeletal: No lytic or sclerotic lesion. There is some
glenohumeral degenerative change bilaterally.

Review of the MIP images confirms the above findings.
IMPRESSION: Negative for pulmonary embolus.  No acute finding.

Very small hiatal hernia.

Calcific aortic and coronary atherosclerosis.

## 2018-06-21 ENCOUNTER — Emergency Department (HOSPITAL_COMMUNITY)
Admission: EM | Admit: 2018-06-21 | Discharge: 2018-06-21 | Disposition: A | Discharge status: Home / Self Care | Payer: Medicare HMO | Attending: Emergency Medicine | Admitting: Emergency Medicine

## 2018-06-21 ENCOUNTER — Emergency Department (HOSPITAL_COMMUNITY): Payer: Medicare HMO

## 2018-06-21 ENCOUNTER — Other Ambulatory Visit: Payer: Self-pay

## 2018-06-21 ENCOUNTER — Encounter (HOSPITAL_COMMUNITY): Payer: Self-pay | Admitting: *Deleted

## 2018-06-21 DIAGNOSIS — Z79899 Other long term (current) drug therapy: Secondary | ICD-10-CM | POA: Diagnosis not present

## 2018-06-21 DIAGNOSIS — I10 Essential (primary) hypertension: Secondary | ICD-10-CM | POA: Insufficient documentation

## 2018-06-21 DIAGNOSIS — Z87891 Personal history of nicotine dependence: Secondary | ICD-10-CM | POA: Insufficient documentation

## 2018-06-21 DIAGNOSIS — M069 Rheumatoid arthritis, unspecified: Secondary | ICD-10-CM | POA: Diagnosis not present

## 2018-06-21 DIAGNOSIS — Z20828 Contact with and (suspected) exposure to other viral communicable diseases: Secondary | ICD-10-CM | POA: Diagnosis not present

## 2018-06-21 LAB — COMPREHENSIVE METABOLIC PANEL
ALT: 34 U/L (ref 0–44)
AST: 38 U/L (ref 15–41)
Albumin: 4.3 g/dL (ref 3.5–5.0)
Alkaline Phosphatase: 98 U/L (ref 38–126)
Anion gap: 11 (ref 5–15)
BUN: 12 mg/dL (ref 8–23)
CO2: 20 mmol/L — ABNORMAL LOW (ref 22–32)
Calcium: 9.4 mg/dL (ref 8.9–10.3)
Chloride: 107 mmol/L (ref 98–111)
Creatinine, Ser: 0.72 mg/dL (ref 0.44–1.00)
GFR calc Af Amer: >60 mL/min (ref >60)
GFR calc non Af Amer: >60 mL/min (ref >60)
Glucose, Bld: 88 mg/dL (ref 70–99)
Potassium: 3.8 mmol/L (ref 3.5–5.1)
Sodium: 138 mmol/L (ref 135–145)
Total Bilirubin: 0.7 mg/dL (ref 0.3–1.2)
Total Protein: 7.6 g/dL (ref 6.5–8.1)

## 2018-06-21 LAB — CBC WITH DIFFERENTIAL/PLATELET
Abs Immature Granulocytes: 0.02 10*3/uL (ref 0.00–0.07)
Basophils Absolute: 0.1 10*3/uL (ref 0.0–0.1)
Basophils Relative: 1 %
Eosinophils Absolute: 0.1 10*3/uL (ref 0.0–0.5)
Eosinophils Relative: 1 %
HCT: 44.8 % (ref 36.0–46.0)
Hemoglobin: 14.9 g/dL (ref 12.0–15.0)
Immature Granulocytes: 0 %
Lymphocytes Relative: 30 %
Lymphs Abs: 1.6 10*3/uL (ref 0.7–4.0)
MCH: 31.4 pg (ref 26.0–34.0)
MCHC: 33.3 g/dL (ref 30.0–36.0)
MCV: 94.5 fL (ref 80.0–100.0)
Monocytes Absolute: 0.5 10*3/uL (ref 0.1–1.0)
Monocytes Relative: 8 %
Neutro Abs: 3.3 10*3/uL (ref 1.7–7.7)
Neutrophils Relative %: 60 %
Platelets: 192 10*3/uL (ref 150–400)
RBC: 4.74 MIL/uL (ref 3.87–5.11)
RDW: 12.9 % (ref 11.5–15.5)
WBC: 5.6 10*3/uL (ref 4.0–10.5)
nRBC: 0 % (ref 0.0–0.2)

## 2018-06-21 LAB — TROPONIN I: Troponin I: <0.03 ng/mL (ref <0.03)

## 2018-06-21 NOTE — ED Provider Notes (Addendum)
[facility] EMERGENCY DEPARTMENT Provider Note   CSN: [id number] Arrival date & time: 06/21/18  1251    History   Chief Complaint Chief Complaint  Patient presents with   Hypertension    HPI [name] [name] is a 73 y.o. female.     Patient is a 73 year old female with a history of RA, vitamin deficiency, GERD and panic attacks presenting today with complaints of feeling lightheaded and having elevated blood pressure.  Patient states yesterday she was working out in her yard and thinks she may have overdone it.  After being outside she was feeling fatigued, lightheaded and not quite herself.  She checked her blood pressure yesterday with her home cuff and stated it was normal but cannot remember what the reading was.  She does state drinking plenty of fluids yesterday but also states that she has been drinking too much alcohol due to boredom.  Yesterday she thinks she only drank 2 beers.  However when she woke up this morning she felt lightheaded when she would try to get up and walk and generally fatigued and not quite herself.  She also noticed some soreness in her left arm which she attributed to her RA because she has had that discomfort before.  She then checked her blood pressure around 10:00 this morning and it was elevated at 170/80 which she states is very unusual for her.  She took a 325 mg aspirin and continue to check her blood pressure but it remained elevated so she came for further evaluation.  She denies any chest pain, shortness of breath, nausea, vomiting or abdominal pain.  She has no unilateral leg pain and no lower extremity swelling.  Patient states that she will occasionally have elevated blood pressure but every time she goes to her doctor it is normal.  However she states she has not seen her doctor in quite some time.  She has had cardiac work-up in the past and everything has been normal.  She denies any cough, congestion or fever.  The history is  provided by the patient.  Hypertension  This is a new problem. Episode onset: today. The problem occurs constantly. The problem has not changed since onset.Associated symptoms comments: Lightheaded and just not feeling herself.  General fatigue and some discomfort in the left arm.. Nothing aggravates the symptoms. Nothing relieves the symptoms. She has tried ASA for the symptoms. The treatment provided no relief.    Past Medical History:  Diagnosis Date   Anxiety disorder    Arthritis    Depression    Flu    01/2017    GERD (gastroesophageal reflux disease)    Panic attack    panic attacks    RA (rheumatoid arthritis) ([place])    Urinary tract infection    hx of 02/2016    Vitamin D deficiency     Patient Active Problem List   Diagnosis Date Noted   Status post total left knee replacement 04/07/2016   Abnormal ECG 03/20/2016   Pelvic prolapse 12/13/2014   Alcohol abuse 10/31/2014   Pre-syncope 10/05/2014   Dizziness 10/04/2014    Past Surgical History:  Procedure Laterality Date   ABDOMINAL HYSTERECTOMY     ANTERIOR AND POSTERIOR REPAIR WITH SACROSPINOUS FIXATION N/A 12/13/2014   Procedure: ANTERIOR AND POSTERIOR REPAIR WITH SACROSPINOUS LIGAMENT SUSPENSION;  Surgeon: Everlene Farrier, MD;  Location: [facility];  Service: Gynecology;  Laterality: N/A;   bladder tack surgery      BRONCHOSCOPY  COLONOSCOPY     TOTAL KNEE ARTHROPLASTY Left 04/07/2016   Procedure: LEFT  TOTAL KNEE ARTHROPLASTY;  Surgeon: Paralee Cancel, MD;  Location: [facility];  Service: Orthopedics;  Laterality: Left;     OB History   No obstetric history on file.      Home Medications    Prior to Admission medications   Medication Sig Start Date End Date Taking? Authorizing Provider  citalopram (CELEXA) 20 MG tablet Take 20 mg by mouth daily. 10/26/14   [provider]  docusate sodium (COLACE) 100 MG capsule Take 1 capsule (100 mg total) by mouth 2 (two) times daily. 04/07/16    Danae Orleans, PA-C  ferrous sulfate (FERROUSUL) 325 (65 FE) MG tablet Take 1 tablet (325 mg total) by mouth 3 (three) times daily with meals. 04/07/16   Danae Orleans, PA-C  methocarbamol (ROBAXIN) 500 MG tablet Take 1 tablet (500 mg total) by mouth every 6 (six) hours as needed for muscle spasms. 04/07/16   Danae Orleans, PA-C  oxyCODONE (OXY IR/ROXICODONE) 5 MG immediate release tablet Take 1-2 tablets (5-10 mg total) by mouth every 4 (four) hours as needed for moderate pain. 04/08/16   Danae Orleans, PA-C  polyethylene glycol (MIRALAX / GLYCOLAX) packet Take 17 g by mouth 2 (two) times daily. 04/07/16   Danae Orleans, PA-C  sodium chloride (OCEAN) 0.65 % SOLN nasal spray Place 1 spray into both nostrils every 6 (six) hours as needed for congestion.    [provider]    Family History Family History  Problem Relation Age of Onset   Heart attack Mother    Breast cancer Sister    Breast cancer Maternal Aunt     Social History Social History   Tobacco Use   Smoking status: Former Smoker   Smokeless tobacco: Never Used  Substance Use Topics   Alcohol use: Yes    Comment: 2-3 glasses of wine some days "I probably drink  more than I should"   Drug use: No     Allergies   Clinoril [sulindac] and Voltaren [diclofenac sodium]   Review of Systems Review of Systems  All other systems reviewed and are negative.    Physical Exam Updated Vital Signs BP (!) 195/96 (BP Location: Right Arm)    Pulse 74    Temp 98.1 F (36.7 C) (Oral)    Resp 14    Ht 5\' 8"  (1.727 m)    Wt 70.3 kg    SpO2 100%    BMI 23.57 kg/m   Physical Exam Vitals signs and nursing note reviewed.  Constitutional:      General: She is not in acute distress.    Appearance: She is well-developed.  HENT:     Head: Normocephalic and atraumatic.  Eyes:     Pupils: Pupils are equal, round, and reactive to light.  Cardiovascular:     Rate and Rhythm: Normal rate and regular rhythm.     Heart  sounds: Normal heart sounds. No murmur. No friction rub.  Pulmonary:     Effort: Pulmonary effort is normal.     Breath sounds: Normal breath sounds. No wheezing or rales.  Abdominal:     General: Bowel sounds are normal. There is no distension.     Palpations: Abdomen is soft.     Tenderness: There is no abdominal tenderness. There is no guarding or rebound.  Musculoskeletal: Normal range of motion.        General: Tenderness present.     Right  lower leg: No edema.     Left lower leg: No edema.     Comments: No edema.  Mild tenderness over the left trapezius but no reproducible tenderness in the left arm with palpation or movement.  Skin:    General: Skin is warm and dry.     Findings: No rash.  Neurological:     General: No focal deficit present.     Mental Status: She is alert and oriented to person, place, and time. Mental status is at baseline.     Cranial Nerves: No cranial nerve deficit.  Psychiatric:        Mood and Affect: Mood normal.        Behavior: Behavior normal.        Thought Content: Thought content normal.      ED Treatments / Results  Labs (all labs ordered are listed, but only abnormal results are displayed) Labs Reviewed  COMPREHENSIVE METABOLIC PANEL - Abnormal; Notable for the following components:      Result Value   CO2 20 (*)    All other components within normal limits  TROPONIN I  CBC WITH DIFFERENTIAL/PLATELET    EKG EKG Interpretation  Date/Time:  Tuesday Jun 21 2018 13:39:38 EDT Ventricular Rate:  65 PR Interval:    QRS Duration: 98 QT Interval:  488 QTC Calculation: 508 R Axis:   2 Text Interpretation:  Sinus rhythm Abnormal R-wave progression, early transition Abnormal T, consider ischemia, diffuse leads new Prolonged QT interval but otherwise EKG is unchanged since prior tracing. Confirmed by Blanchie Dessert [phone]) on 06/21/2018 1:46:59 PM Also confirmed by Blanchie Dessert [phone]), editor Hattie Perch ([id number])  on 06/21/2018  2:11:18 PM   Radiology No results found.  Procedures Procedures (including critical care time)  Medications Ordered in ED Medications - No data to display   Initial Impression / Assessment and Plan / ED Course  I have reviewed the triage vital signs and the nursing notes.  Pertinent labs & imaging results that were available during my care of the patient were reviewed by me and considered in my medical decision making (see chart for details).        Elderly female presenting today with vague symptoms of not feeling well with some mild arm discomfort which she attributes to her RA in the setting of elevated blood pressure.  Patient has no known cardiac disease but states that her blood pressure was elevated today and she did not feel quite right.  Yesterday she was outside in the heat doing yard work and thinks she may have overdone it.  She also drinks significant amount of alcohol almost on a daily basis.  She did eat last night and has had no nausea or vomiting.  Patient is hypertensive here as well but in no acute distress.  She has no infectious symptoms concerning for COVID at this time.  She is having no chest pain concerning for PE or dissection.  This could be atypical ACS versus dehydration from heat exhaustion yesterday in the setting of alcohol consumption versus electrolyte abnormality.  EKG, chest x-ray, CBC, CMP, troponin pending.  Patient has already taken 325 mg of aspirin today.  Continue to cycle blood pressure.  6:26 PM Pt labs, ekg and cxr without acute findings.  Pt's bp improved without intervention. Pt to f/u with pcp  Final Clinical Impressions(s) / ED Diagnoses   Final diagnoses:  Hypertension, unspecified type    ED Discharge Orders    None  Blanchie Dessert, MD 06/21/18 [place]    Blanchie Dessert, MD 07/05/18 2026

## 2018-06-21 NOTE — ED Triage Notes (Signed)
PT in c/o hypertension onset today, reports BP at home sys 180, pt states, "I have rheumatoid arthritis so I normally have arm pain." pt denies hx of HTN, denies CP, denies SOB, A&O x4, ambulatory, HR 77, BP 195/96 in triage

## 2018-06-21 NOTE — Discharge Instructions (Signed)
Make sure you are not getting overheated or dehydrated.  Also be sure that you are not overdoing it.  Continue to eat a low-salt diet and exercise.  You can check your blood pressure 2 or 3 times a week and record the number so that you can give the information to your doctor.  Without any medications your blood pressure today improved to 140/82.  All your blood work including the blood test to your heart, kidney function, electrolytes and blood counts were normal.  Your a EKG did not show any new changes in your chest x-ray showed just a small area in the left lower part of your lung that is unchanged from a year ago.  He can follow-up with your doctor about this whether they want to do further testing.

## 2018-06-21 NOTE — ED Notes (Signed)
Discharge instructions discussed with Pt. Pt verbalized understanding. Pt stable and ambulatory.    

## 2018-06-30 DIAGNOSIS — H57811 Brow ptosis, right: Secondary | ICD-10-CM | POA: Diagnosis not present

## 2018-06-30 DIAGNOSIS — G510 Bell's palsy: Secondary | ICD-10-CM | POA: Diagnosis not present

## 2018-06-30 DIAGNOSIS — H02831 Dermatochalasis of right upper eyelid: Secondary | ICD-10-CM | POA: Diagnosis not present

## 2018-06-30 DIAGNOSIS — H02834 Dermatochalasis of left upper eyelid: Secondary | ICD-10-CM | POA: Diagnosis not present

## 2018-09-01 DIAGNOSIS — R5383 Other fatigue: Secondary | ICD-10-CM | POA: Diagnosis not present

## 2018-09-01 DIAGNOSIS — M1990 Unspecified osteoarthritis, unspecified site: Secondary | ICD-10-CM | POA: Diagnosis not present

## 2018-09-01 DIAGNOSIS — M79646 Pain in unspecified finger(s): Secondary | ICD-10-CM | POA: Diagnosis not present

## 2018-09-01 DIAGNOSIS — M7989 Other specified soft tissue disorders: Secondary | ICD-10-CM | POA: Diagnosis not present

## 2018-09-01 DIAGNOSIS — M170 Bilateral primary osteoarthritis of knee: Secondary | ICD-10-CM | POA: Diagnosis not present

## 2018-09-01 DIAGNOSIS — M810 Age-related osteoporosis without current pathological fracture: Secondary | ICD-10-CM | POA: Diagnosis not present

## 2018-09-01 DIAGNOSIS — M0579 Rheumatoid arthritis with rheumatoid factor of multiple sites without organ or systems involvement: Secondary | ICD-10-CM | POA: Diagnosis not present

## 2018-09-01 DIAGNOSIS — M25519 Pain in unspecified shoulder: Secondary | ICD-10-CM | POA: Diagnosis not present

## 2018-12-01 DIAGNOSIS — R194 Change in bowel habit: Secondary | ICD-10-CM | POA: Diagnosis not present

## 2018-12-01 DIAGNOSIS — Z86010 Personal history of colonic polyps: Secondary | ICD-10-CM | POA: Diagnosis not present

## 2018-12-15 DIAGNOSIS — M170 Bilateral primary osteoarthritis of knee: Secondary | ICD-10-CM | POA: Diagnosis not present

## 2018-12-15 DIAGNOSIS — M1990 Unspecified osteoarthritis, unspecified site: Secondary | ICD-10-CM | POA: Diagnosis not present

## 2018-12-15 DIAGNOSIS — M25511 Pain in right shoulder: Secondary | ICD-10-CM | POA: Diagnosis not present

## 2018-12-15 DIAGNOSIS — M0579 Rheumatoid arthritis with rheumatoid factor of multiple sites without organ or systems involvement: Secondary | ICD-10-CM | POA: Diagnosis not present

## 2018-12-15 DIAGNOSIS — M7581 Other shoulder lesions, right shoulder: Secondary | ICD-10-CM | POA: Diagnosis not present

## 2018-12-15 DIAGNOSIS — M810 Age-related osteoporosis without current pathological fracture: Secondary | ICD-10-CM | POA: Diagnosis not present

## 2018-12-22 DIAGNOSIS — Z1159 Encounter for screening for other viral diseases: Secondary | ICD-10-CM | POA: Diagnosis not present

## 2018-12-27 DIAGNOSIS — K640 First degree hemorrhoids: Secondary | ICD-10-CM | POA: Diagnosis not present

## 2018-12-27 DIAGNOSIS — Z86010 Personal history of colonic polyps: Secondary | ICD-10-CM | POA: Diagnosis not present

## 2018-12-27 DIAGNOSIS — K5730 Diverticulosis of large intestine without perforation or abscess without bleeding: Secondary | ICD-10-CM | POA: Diagnosis not present

## 2018-12-27 DIAGNOSIS — D122 Benign neoplasm of ascending colon: Secondary | ICD-10-CM | POA: Diagnosis not present

## 2018-12-30 DIAGNOSIS — D122 Benign neoplasm of ascending colon: Secondary | ICD-10-CM | POA: Diagnosis not present

## 2019-01-23 ENCOUNTER — Other Ambulatory Visit: Payer: Self-pay

## 2019-01-23 DIAGNOSIS — Z20822 Contact with and (suspected) exposure to covid-19: Secondary | ICD-10-CM

## 2019-01-25 LAB — NOVEL CORONAVIRUS, NAA: SARS-CoV-2, NAA: NOT DETECTED

## 2019-01-31 ENCOUNTER — Other Ambulatory Visit: Payer: Self-pay | Admitting: Obstetrics and Gynecology

## 2019-01-31 DIAGNOSIS — Z1231 Encounter for screening mammogram for malignant neoplasm of breast: Secondary | ICD-10-CM

## 2019-02-19 DIAGNOSIS — Z20828 Contact with and (suspected) exposure to other viral communicable diseases: Secondary | ICD-10-CM | POA: Diagnosis not present

## 2019-02-25 ENCOUNTER — Emergency Department (HOSPITAL_COMMUNITY)
Admission: EM | Admit: 2019-02-25 | Discharge: 2019-02-25 | Disposition: A | Discharge status: Home / Self Care | Payer: Medicare HMO | Attending: Emergency Medicine | Admitting: Emergency Medicine

## 2019-02-25 ENCOUNTER — Encounter (HOSPITAL_COMMUNITY): Payer: Self-pay | Admitting: Emergency Medicine

## 2019-02-25 ENCOUNTER — Other Ambulatory Visit: Payer: Self-pay

## 2019-02-25 DIAGNOSIS — Z5321 Procedure and treatment not carried out due to patient leaving prior to being seen by health care provider: Secondary | ICD-10-CM | POA: Insufficient documentation

## 2019-02-25 DIAGNOSIS — U071 COVID-19: Secondary | ICD-10-CM | POA: Diagnosis not present

## 2019-02-25 DIAGNOSIS — R0602 Shortness of breath: Secondary | ICD-10-CM | POA: Diagnosis not present

## 2019-02-25 NOTE — ED Triage Notes (Signed)
Pt reports that she tested Covid+ last Sunday. Reports when she is up moving around will get fatigued and SOB. Also having headaches believes last time took anything was last night. Pt PCP started her on z-pack yesterday for sinus symptoms.

## 2019-02-27 DIAGNOSIS — Z90710 Acquired absence of both cervix and uterus: Secondary | ICD-10-CM | POA: Diagnosis not present

## 2019-02-27 DIAGNOSIS — J1282 Pneumonia due to coronavirus disease 2019: Secondary | ICD-10-CM | POA: Insufficient documentation

## 2019-02-27 DIAGNOSIS — J9601 Acute respiratory failure with hypoxia: Secondary | ICD-10-CM | POA: Diagnosis not present

## 2019-02-27 DIAGNOSIS — M069 Rheumatoid arthritis, unspecified: Secondary | ICD-10-CM | POA: Diagnosis not present

## 2019-02-27 DIAGNOSIS — R748 Abnormal levels of other serum enzymes: Secondary | ICD-10-CM | POA: Diagnosis not present

## 2019-02-27 DIAGNOSIS — F329 Major depressive disorder, single episode, unspecified: Secondary | ICD-10-CM | POA: Diagnosis not present

## 2019-02-27 DIAGNOSIS — U071 COVID-19: Secondary | ICD-10-CM | POA: Diagnosis not present

## 2019-02-27 DIAGNOSIS — J1289 Other viral pneumonia: Secondary | ICD-10-CM | POA: Diagnosis not present

## 2019-02-27 DIAGNOSIS — R0789 Other chest pain: Secondary | ICD-10-CM | POA: Diagnosis not present

## 2019-02-27 DIAGNOSIS — Z96652 Presence of left artificial knee joint: Secondary | ICD-10-CM | POA: Diagnosis not present

## 2019-02-27 DIAGNOSIS — Z9981 Dependence on supplemental oxygen: Secondary | ICD-10-CM | POA: Diagnosis not present

## 2019-02-27 DIAGNOSIS — Z7982 Long term (current) use of aspirin: Secondary | ICD-10-CM | POA: Diagnosis not present

## 2019-02-27 DIAGNOSIS — R0602 Shortness of breath: Secondary | ICD-10-CM | POA: Diagnosis not present

## 2019-02-27 DIAGNOSIS — I517 Cardiomegaly: Secondary | ICD-10-CM | POA: Diagnosis not present

## 2019-03-14 DIAGNOSIS — U071 COVID-19: Secondary | ICD-10-CM | POA: Diagnosis not present

## 2019-03-14 DIAGNOSIS — G4700 Insomnia, unspecified: Secondary | ICD-10-CM | POA: Diagnosis not present

## 2019-03-14 DIAGNOSIS — R945 Abnormal results of liver function studies: Secondary | ICD-10-CM | POA: Diagnosis not present

## 2019-03-14 DIAGNOSIS — R0609 Other forms of dyspnea: Secondary | ICD-10-CM | POA: Diagnosis not present

## 2019-03-20 ENCOUNTER — Other Ambulatory Visit: Payer: Self-pay

## 2019-03-20 ENCOUNTER — Ambulatory Visit
Admission: RE | Admit: 2019-03-20 | Discharge: 2019-03-20 | Disposition: A | Discharge status: Home / Self Care | Payer: Medicare HMO | Source: Ambulatory Visit | Attending: Obstetrics and Gynecology | Admitting: Obstetrics and Gynecology

## 2019-03-20 DIAGNOSIS — Z1231 Encounter for screening mammogram for malignant neoplasm of breast: Secondary | ICD-10-CM

## 2019-04-09 NOTE — Progress Notes (Signed)
Cardiology Office Note   Date:  04/13/2019   ID:  [name], [name] [name] [name], MRN [id number]  PCP:  [name] Gravel, MD  Cardiologist:   [name] Lightle Martinique, MD   Chief Complaint  Patient presents with  . New Patient (Initial Visit)      History of Present Illness: [name] [name] is a 74 y.o. female seen at the request of [name] [name] for evaluation of dyspnea and coronary artery calcification. She was last seen in 2018. She was admitted in August 2016 with near syncope, left arm pain and numbness. She had some anterior T wave abnormalities on Ecg. She ruled out for MI. Subsequent evaluation with Lexiscan Myoview and Echo were unremarkable. She was under considerable stress at that time which was felt to contribute to her symptoms. She does have a history of panic attacks and RA. No history of HTN, HL, DM, tobacco use, or family history of premature CAD. She was noted on CT in 2018 to have coronary calcification when CT done to rule out PE.  More recently she was admitted to [facility] in [name] with Covid 19 infection with multilobar PNA. Treated with steroids and Remdesivir. Was in the hospital about a week. Had SOB, cough, HA, fatigue and loss of taste and smell. Echo in the hospital was unremarkable. She had elevated D dimer and CRP levels. BNP was normal. She has since noted improvement in her symptoms. No chest pain. Breathing still not completely back to normal. CT in the hospital did show coronary calcification again.     Past Medical History:  Diagnosis Date  . Anxiety disorder   . Arthritis   . Depression   . Flu    01/2017   . GERD (gastroesophageal reflux disease)   . Panic attack    panic attacks   . RA (rheumatoid arthritis) ([place])   . Urinary tract infection    hx of 02/2016   . Vitamin D deficiency     Past Surgical History:  Procedure Laterality Date  . ABDOMINAL HYSTERECTOMY    . ANTERIOR AND POSTERIOR REPAIR WITH SACROSPINOUS FIXATION N/A 12/13/2014   Procedure: ANTERIOR AND POSTERIOR REPAIR WITH SACROSPINOUS LIGAMENT SUSPENSION;  Surgeon: [name] Farrier, MD;  Location: [facility];  Service: Gynecology;  Laterality: N/A;  . bladder tack surgery     . BRONCHOSCOPY    . COLONOSCOPY    . TOTAL KNEE ARTHROPLASTY Left 04/07/2016   Procedure: LEFT  TOTAL KNEE ARTHROPLASTY;  Surgeon: [name] Cancel, MD;  Location: [facility];  Service: Orthopedics;  Laterality: Left;     Current Outpatient Medications  Medication Sig Dispense Refill  . citalopram (CELEXA) 20 MG tablet Take 20 mg by mouth daily.  0  . sodium chloride (OCEAN) 0.65 % SOLN nasal spray Place 1 spray into both nostrils every 6 (six) hours as needed for congestion.    . hydroxychloroquine (PLAQUENIL) 200 MG tablet Take 200 mg by mouth daily.     No current facility-administered medications for this visit.    Allergies:   Clinoril [sulindac] and Voltaren [diclofenac sodium]    Social History:  The patient  reports that she has quit smoking. She has never used smokeless tobacco. She reports current alcohol use. She reports that she does not use drugs.   Family History:  The patient's family history includes Breast cancer in her maternal aunt and sister; Heart attack in her mother; Melanoma in her niece.    ROS:  Please see the history of present  illness.   Otherwise, review of systems are positive for none.   All other systems are reviewed and negative.    PHYSICAL EXAM: VS:  BP 108/62 (BP Location: Left Arm, Patient Position: Sitting, Cuff Size: Normal)   Pulse 79   Temp 97.6 F (36.4 C)   Ht 5\' 8"  (1.727 m)   Wt 152 lb (68.9 kg)   BMI 23.11 kg/m  , BMI Body mass index is 23.11 kg/m. GEN: Well nourished, well developed, in no acute distress  HEENT: normal  Neck: no JVD, carotid bruits, or masses Cardiac: RRR; no murmurs, rubs, or gallops,no edema  Respiratory:  Faint bilateral crackles. , normal work of breathing GI: soft, nontender, nondistended, + BS MS: no deformity or  atrophy  Skin: warm and dry, no rash Neuro:  Strength and sensation are intact Psych: euthymic mood, full affect   EKG:  EKG is ordered today. The ekg ordered today demonstrates NSR with RBBB. I have personally reviewed and interpreted this study.    Recent Labs: 06/21/2018: ALT 34; BUN 12; Creatinine, Ser 0.72; Hemoglobin 14.9; Platelets 192; Potassium 3.8; Sodium 138    Lipid Panel    Component Value Date/Time   CHOL 209 (H) 10/05/2014 0217   TRIG 42 10/05/2014 0217   HDL 76 10/05/2014 0217   CHOLHDL 2.8 10/05/2014 0217   VLDL 8 10/05/2014 0217   LDLCALC 125 (H) 10/05/2014 0217   Dated 09/10/15: cholesterol 212, triglycerides 57, HDL 88, LDL 110.  Dated 02/28/19: cholesterol 129, triglycerides 52, HDL 38, LDL 81.CMET with ALT 41. Glucose 126. BNP 81, CBC normal D dimer up to 1110, CRP 32.  Wt Readings from Last 3 Encounters:  04/13/19 152 lb (68.9 kg)  06/21/18 155 lb (70.3 kg)  04/22/17 153 lb (69.4 kg)    Dated 09/01/18: CBC and CMET normal.   Other studies Reviewed: Additional studies/ records that were reviewed today include:    Myoview 10/05/14:Study Result   CLINICAL DATA: Chest pain, jaw pain and left arm weakness. Syncope.  EXAM: MYOCARDIAL IMAGING WITH SPECT (REST AND PHARMACOLOGIC-STRESS)  GATED LEFT VENTRICULAR WALL MOTION STUDY  LEFT VENTRICULAR EJECTION FRACTION  TECHNIQUE: Standard myocardial SPECT imaging was performed after resting intravenous injection of 10 mCi Tc-53m sestamibi. Subsequently, intravenous infusion of Lexiscan was performed under the supervision of the Cardiology staff. At peak effect of the drug, 30 mCi Tc-57m sestamibi was injected intravenously and standard myocardial SPECT imaging was performed. Quantitative gated imaging was also performed to evaluate left ventricular wall motion, and estimate left ventricular ejection fraction.  COMPARISON: None.  FINDINGS: Perfusion: No decreased activity in the left  ventricle on stress imaging to suggest reversible ischemia or infarction.  Wall Motion: Normal left ventricular wall motion. No left ventricular dilation.  Left Ventricular Ejection Fraction: 80 %  End diastolic volume 63 ml  End systolic volume 13 ml  IMPRESSION: 1. No reversible ischemia or infarction.  2. Normal left ventricular wall motion.  3. Left ventricular ejection fraction of 80%  4. Low-risk stress test findings*.  *2012 Appropriate Use Criteria for Coronary Revascularization Focused Update: J Am Coll Cardiol. N6492421. http://content.airportbarriers.com.aspx?articleid=1201161   Electronically Signed By: Marijo Sanes M.D. On: 10/05/2014 13:57   Echo: 10/12/14:Study Conclusions  - Left ventricle: The cavity size was normal. There was mild focal basal and mild concentric hypertrophy of the septum. Systolic function was normal. The estimated ejection fraction was in the range of 55% to 60%. Wall motion was normal; there were no regional wall motion abnormalities.  Left ventricular diastolic function parameters were normal. - Atrial septum: No defect or patent foramen ovale was identified.  Echo 02/28/19: Summary Technically difficult and limited study. Normal left ventricular size and systolic function with no appreciable segmental abnormality. Ejection fraction is visually estimated at 60-65% Mild concentric left ventricular hypertrophy No significant valvular abnormalities. ASSESSMENT AND PLAN:  1.  Covid 19 infection with multilobar PNA. Recovering. 2. Coronary calcification noted on CT. No chest pain. Dyspnea likely still due to Covid. Calcification also noted on CT in 2018. Last Myoview 4.5 years ago. Coronary risk factors include RA +/- cholesterol. I would recommend repeat lexiscan Myoview. If low risk I would focus on risk factor modification.  3. Hypercholesterolemia. Recent LDL 81 but may be falsely low with  acute illness. Historically LDL > 110. I would repeat lipid panel in steady state. Will coronary calcification goal LDL should be less than 70. If higher than this I would add statin therapy 4. RBBB. New. Benign.  5. RA.    Current medicines are reviewed at length with the patient today.  The patient does not have concerns regarding medicines.  The following changes have been made:  no change  Labs/ tests ordered today include:   Orders Placed This Encounter  Procedures  . Myocardial Perfusion Imaging  . EKG 12-Lead     Disposition:   FU TBD based on Myoview results.   Signed, Tyler Cubit Martinique, MD  04/13/2019 2:11 PM    [facility] [address], [place], [place], [address] Phone [phone], Fax [phone]

## 2019-04-13 ENCOUNTER — Other Ambulatory Visit: Payer: Self-pay

## 2019-04-13 ENCOUNTER — Ambulatory Visit: Payer: Medicare HMO | Admitting: Cardiology

## 2019-04-13 ENCOUNTER — Encounter: Payer: Self-pay | Admitting: Cardiology

## 2019-04-13 VITALS — BP 108/62 | HR 79 | Temp 97.6°F | Ht 68.0 in | Wt 152.0 lb

## 2019-04-13 DIAGNOSIS — E7800 Pure hypercholesterolemia, unspecified: Secondary | ICD-10-CM | POA: Diagnosis not present

## 2019-04-13 DIAGNOSIS — I2584 Coronary atherosclerosis due to calcified coronary lesion: Secondary | ICD-10-CM | POA: Diagnosis not present

## 2019-04-13 DIAGNOSIS — I4510 Unspecified right bundle-branch block: Secondary | ICD-10-CM

## 2019-04-13 DIAGNOSIS — R0602 Shortness of breath: Secondary | ICD-10-CM | POA: Diagnosis not present

## 2019-04-13 DIAGNOSIS — I2510 Atherosclerotic heart disease of native coronary artery without angina pectoris: Secondary | ICD-10-CM

## 2019-04-13 NOTE — Patient Instructions (Signed)
Medication Instructions:  Continue same medications  Lab Work: None ordered  Testing/Procedures: Lexiscan  Follow-Up: At [facility], you and your health needs are our priority.  As part of our continuing mission to provide you with exceptional heart care, we have created designated Provider Care Teams.  These Care Teams include your primary Cardiologist (physician) and Advanced Practice Providers (APPs -  Physician Assistants and Nurse Practitioners) who all work together to provide you with the care you need, when you need it.  Your next appointment:  Will be determined after test

## 2019-04-18 ENCOUNTER — Telehealth (HOSPITAL_COMMUNITY): Payer: Self-pay

## 2019-04-18 NOTE — Telephone Encounter (Signed)
Encounter complete. 

## 2019-04-20 ENCOUNTER — Other Ambulatory Visit: Payer: Self-pay

## 2019-04-20 ENCOUNTER — Ambulatory Visit (HOSPITAL_COMMUNITY)
Admission: RE | Admit: 2019-04-20 | Discharge: 2019-04-20 | Disposition: A | Discharge status: Home / Self Care | Payer: Medicare HMO | Source: Ambulatory Visit | Attending: Cardiology | Admitting: Cardiology

## 2019-04-20 DIAGNOSIS — M170 Bilateral primary osteoarthritis of knee: Secondary | ICD-10-CM | POA: Diagnosis not present

## 2019-04-20 DIAGNOSIS — R0602 Shortness of breath: Secondary | ICD-10-CM | POA: Diagnosis not present

## 2019-04-20 DIAGNOSIS — M1990 Unspecified osteoarthritis, unspecified site: Secondary | ICD-10-CM | POA: Diagnosis not present

## 2019-04-20 DIAGNOSIS — M25519 Pain in unspecified shoulder: Secondary | ICD-10-CM | POA: Diagnosis not present

## 2019-04-20 DIAGNOSIS — M810 Age-related osteoporosis without current pathological fracture: Secondary | ICD-10-CM | POA: Diagnosis not present

## 2019-04-20 DIAGNOSIS — M0579 Rheumatoid arthritis with rheumatoid factor of multiple sites without organ or systems involvement: Secondary | ICD-10-CM | POA: Diagnosis not present

## 2019-04-20 MED ORDER — REGADENOSON 0.4 MG/5ML IV SOLN
0.4000 mg | Freq: Once | INTRAVENOUS | Status: AC
  Administered 2019-04-20: 14:00:00 0.4 mg via INTRAVENOUS

## 2019-04-20 MED ORDER — TECHNETIUM TC 99M TETROFOSMIN IV KIT
9.6000 | PACK | Freq: Once | INTRAVENOUS | Status: AC | PRN
  Administered 2019-04-20: 13:00:00 9.6 via INTRAVENOUS
  Filled 2019-04-20: qty 10

## 2019-04-20 MED ORDER — TECHNETIUM TC 99M TETROFOSMIN IV KIT
30.9000 | PACK | Freq: Once | INTRAVENOUS | Status: AC | PRN
  Administered 2019-04-20: 14:00:00 30.9 via INTRAVENOUS
  Filled 2019-04-20: qty 31

## 2019-04-21 LAB — MYOCARDIAL PERFUSION IMAGING
LV dias vol: 62 mL (ref 46–106)
LV sys vol: 23 mL
Peak HR: 94 {beats}/min
Rest HR: 67 {beats}/min
SDS: 2
SRS: 3
SSS: 5
TID: 1.4

## 2019-06-02 DIAGNOSIS — N390 Urinary tract infection, site not specified: Secondary | ICD-10-CM | POA: Diagnosis not present

## 2019-06-02 DIAGNOSIS — Z0000 Encounter for general adult medical examination without abnormal findings: Secondary | ICD-10-CM | POA: Diagnosis not present

## 2019-06-02 DIAGNOSIS — R945 Abnormal results of liver function studies: Secondary | ICD-10-CM | POA: Diagnosis not present

## 2019-06-09 DIAGNOSIS — M25561 Pain in right knee: Secondary | ICD-10-CM | POA: Diagnosis not present

## 2019-06-09 DIAGNOSIS — M1712 Unilateral primary osteoarthritis, left knee: Secondary | ICD-10-CM | POA: Diagnosis not present

## 2019-06-09 DIAGNOSIS — Z96652 Presence of left artificial knee joint: Secondary | ICD-10-CM | POA: Diagnosis not present

## 2019-06-15 DIAGNOSIS — M810 Age-related osteoporosis without current pathological fracture: Secondary | ICD-10-CM | POA: Diagnosis not present

## 2019-06-15 DIAGNOSIS — G4700 Insomnia, unspecified: Secondary | ICD-10-CM | POA: Diagnosis not present

## 2019-06-15 DIAGNOSIS — Z0000 Encounter for general adult medical examination without abnormal findings: Secondary | ICD-10-CM | POA: Diagnosis not present

## 2019-06-15 DIAGNOSIS — M1990 Unspecified osteoarthritis, unspecified site: Secondary | ICD-10-CM | POA: Diagnosis not present

## 2019-06-15 DIAGNOSIS — M069 Rheumatoid arthritis, unspecified: Secondary | ICD-10-CM | POA: Diagnosis not present

## 2019-06-15 DIAGNOSIS — Z8616 Personal history of COVID-19: Secondary | ICD-10-CM | POA: Diagnosis not present

## 2019-06-15 DIAGNOSIS — E559 Vitamin D deficiency, unspecified: Secondary | ICD-10-CM | POA: Diagnosis not present

## 2019-06-15 DIAGNOSIS — F329 Major depressive disorder, single episode, unspecified: Secondary | ICD-10-CM | POA: Diagnosis not present

## 2019-06-28 DIAGNOSIS — B351 Tinea unguium: Secondary | ICD-10-CM | POA: Diagnosis not present

## 2019-06-28 DIAGNOSIS — L820 Inflamed seborrheic keratosis: Secondary | ICD-10-CM | POA: Diagnosis not present

## 2019-07-03 ENCOUNTER — Other Ambulatory Visit: Payer: Self-pay

## 2019-07-03 ENCOUNTER — Encounter (HOSPITAL_BASED_OUTPATIENT_CLINIC_OR_DEPARTMENT_OTHER): Payer: Self-pay

## 2019-07-03 ENCOUNTER — Emergency Department (HOSPITAL_BASED_OUTPATIENT_CLINIC_OR_DEPARTMENT_OTHER)
Admission: EM | Admit: 2019-07-03 | Discharge: 2019-07-03 | Disposition: A | Discharge status: Home / Self Care | Payer: Medicare HMO | Attending: Emergency Medicine | Admitting: Emergency Medicine

## 2019-07-03 ENCOUNTER — Emergency Department (HOSPITAL_BASED_OUTPATIENT_CLINIC_OR_DEPARTMENT_OTHER): Payer: Medicare HMO

## 2019-07-03 DIAGNOSIS — I499 Cardiac arrhythmia, unspecified: Secondary | ICD-10-CM | POA: Diagnosis present

## 2019-07-03 DIAGNOSIS — Z79899 Other long term (current) drug therapy: Secondary | ICD-10-CM | POA: Diagnosis not present

## 2019-07-03 DIAGNOSIS — Z87891 Personal history of nicotine dependence: Secondary | ICD-10-CM | POA: Insufficient documentation

## 2019-07-03 DIAGNOSIS — Z8616 Personal history of COVID-19: Secondary | ICD-10-CM | POA: Insufficient documentation

## 2019-07-03 DIAGNOSIS — I4891 Unspecified atrial fibrillation: Secondary | ICD-10-CM | POA: Insufficient documentation

## 2019-07-03 LAB — TROPONIN I (HIGH SENSITIVITY)
Troponin I (High Sensitivity): 26 ng/L — ABNORMAL HIGH (ref <18)
Troponin I (High Sensitivity): 28 ng/L — ABNORMAL HIGH (ref <18)

## 2019-07-03 LAB — CBC
HCT: 46.5 % — ABNORMAL HIGH (ref 36.0–46.0)
Hemoglobin: 15.9 g/dL — ABNORMAL HIGH (ref 12.0–15.0)
MCH: 31.5 pg (ref 26.0–34.0)
MCHC: 34.2 g/dL (ref 30.0–36.0)
MCV: 92.1 fL (ref 80.0–100.0)
Platelets: 234 10*3/uL (ref 150–400)
RBC: 5.05 MIL/uL (ref 3.87–5.11)
RDW: 13 % (ref 11.5–15.5)
WBC: 7.9 10*3/uL (ref 4.0–10.5)
nRBC: 0 % (ref 0.0–0.2)

## 2019-07-03 LAB — PROTIME-INR
INR: 0.9 (ref 0.8–1.2)
Prothrombin Time: 12.2 seconds (ref 11.4–15.2)

## 2019-07-03 LAB — BASIC METABOLIC PANEL
Anion gap: 13 (ref 5–15)
BUN: 20 mg/dL (ref 8–23)
CO2: 23 mmol/L (ref 22–32)
Calcium: 9.2 mg/dL (ref 8.9–10.3)
Chloride: 99 mmol/L (ref 98–111)
Creatinine, Ser: 0.71 mg/dL (ref 0.44–1.00)
GFR calc Af Amer: >60 mL/min (ref >60)
GFR calc non Af Amer: >60 mL/min (ref >60)
Glucose, Bld: 91 mg/dL (ref 70–99)
Potassium: 3.8 mmol/L (ref 3.5–5.1)
Sodium: 135 mmol/L (ref 135–145)

## 2019-07-03 LAB — MAGNESIUM: Magnesium: 2.2 mg/dL (ref 1.7–2.4)

## 2019-07-03 LAB — TSH: TSH: 4.224 u[IU]/mL (ref 0.350–4.500)

## 2019-07-03 MED ORDER — SODIUM CHLORIDE 0.9 % IV SOLN: INTRAVENOUS | Status: DC | PRN

## 2019-07-03 MED ORDER — RIVAROXABAN (XARELTO) EDUCATION KIT FOR AFIB PATIENTS
PACK | Freq: Once | Status: AC
  Filled 2019-07-03: qty 1

## 2019-07-03 MED ORDER — RIVAROXABAN 20 MG PO TABS
20.0000 mg | ORAL_TABLET | Freq: Every day | ORAL | Status: DC
  Administered 2019-07-03: 20 mg via ORAL
  Filled 2019-07-03: qty 1

## 2019-07-03 MED ORDER — DILTIAZEM HCL-DEXTROSE 125-5 MG/125ML-% IV SOLN (PREMIX)
5.0000 mg/h | INTRAVENOUS | Status: DC
  Filled 2019-07-03: qty 125

## 2019-07-03 MED ORDER — DILTIAZEM LOAD VIA INFUSION
20.0000 mg | Freq: Once | INTRAVENOUS | Status: DC
  Filled 2019-07-03: qty 20

## 2019-07-03 MED ORDER — METOPROLOL SUCCINATE ER 25 MG PO TB24
12.5000 mg | ORAL_TABLET | Freq: Every day | ORAL | 0 refills | Status: DC
Start: 2019-07-03 — End: 2019-07-20

## 2019-07-03 MED ORDER — RIVAROXABAN 20 MG PO TABS
20.0000 mg | ORAL_TABLET | Freq: Every day | ORAL | 0 refills | Status: DC
Start: 2019-07-03 — End: 2019-07-20

## 2019-07-03 MED ORDER — METOPROLOL TARTRATE 5 MG/5ML IV SOLN
5.0000 mg | Freq: Once | INTRAVENOUS | Status: AC
  Administered 2019-07-03: 5 mg via INTRAVENOUS
  Filled 2019-07-03: qty 5

## 2019-07-03 NOTE — ED Provider Notes (Addendum)
[facility] Provider Note   CSN: [id number] Arrival date & time: 07/03/19  1908     History Chief Complaint  Patient presents with  . Irregular Heart Beat    [name] [name] is a 74 y.o. female.  Pt presents to the ED today with rapid HR.  Pt said she started feeling funny and checked her HR.  She said it went up to 180.  She had 1 episode like this last week, but it went away.  This time, it was not going away.  She has no hx of afib, but her sister has it.  Pt felt some chest tightness and sob as well.  No cough or fever.  She did have Covid in January.  She has not had any of the vaccine.            Past Medical History:  Diagnosis Date  . Anxiety disorder   . Arthritis   . Depression   . Flu    01/2017   . GERD (gastroesophageal reflux disease)   . Panic attack    panic attacks   . RA (rheumatoid arthritis) ([place])   . Urinary tract infection    hx of 02/2016   . Vitamin D deficiency     Patient Active Problem List   Diagnosis Date Noted  . Status post total left knee replacement 04/07/2016  . Abnormal ECG 03/20/2016  . Pelvic prolapse 12/13/2014  . Alcohol abuse 10/31/2014  . Pre-syncope 10/05/2014  . Dizziness 10/04/2014    Past Surgical History:  Procedure Laterality Date  . ABDOMINAL HYSTERECTOMY    . ANTERIOR AND POSTERIOR REPAIR WITH SACROSPINOUS FIXATION N/A 12/13/2014   Procedure: ANTERIOR AND POSTERIOR REPAIR WITH SACROSPINOUS LIGAMENT SUSPENSION;  Surgeon: Everlene Farrier, MD;  Location: [facility];  Service: Gynecology;  Laterality: N/A;  . bladder tack surgery     . BRONCHOSCOPY    . COLONOSCOPY    . TOTAL KNEE ARTHROPLASTY Left 04/07/2016   Procedure: LEFT  TOTAL KNEE ARTHROPLASTY;  Surgeon: Paralee Cancel, MD;  Location: [facility];  Service: Orthopedics;  Laterality: Left;     OB History   No obstetric history on file.     Family History  Problem Relation Age of Onset  . Heart attack Mother   . Breast  cancer Sister   . Breast cancer Maternal Aunt   . Melanoma Niece     Social History   Tobacco Use  . Smoking status: Former Research scientist (life sciences)  . Smokeless tobacco: Never Used  Substance Use Topics  . Alcohol use: Yes    Comment: 2-3 glasses of wine some days "I probably drink  more than I should"  . Drug use: No    Home Medications Prior to Admission medications   Medication Sig Start Date End Date Taking? Authorizing Provider  citalopram (CELEXA) 20 MG tablet Take 20 mg by mouth daily. 10/26/14   [provider]  hydroxychloroquine (PLAQUENIL) 200 MG tablet Take 200 mg by mouth daily. 12/12/18   [provider]  metoprolol succinate (TOPROL-XL) 25 MG 24 hr tablet Take 0.5 tablets (12.5 mg total) by mouth daily. 07/03/19   Isla Pence, MD  rivaroxaban (XARELTO) 20 MG TABS tablet Take 1 tablet (20 mg total) by mouth daily with supper. 07/03/19   Isla Pence, MD  sodium chloride (OCEAN) 0.65 % SOLN nasal spray Place 1 spray into both nostrils every 6 (six) hours as needed for congestion.    [provider]  Allergies    Clinoril [sulindac] and Voltaren [diclofenac sodium]  Review of Systems   Review of Systems  Respiratory: Positive for shortness of breath.   Cardiovascular: Positive for chest pain and palpitations.  All other systems reviewed and are negative.   Physical Exam Updated Vital Signs BP 110/76 (BP Location: Right Arm)   Pulse 70   Temp 98 F (36.7 C) (Oral)   Resp 16   Ht '5\' 8"'  (1.727 m)   Wt 69.9 kg   SpO2 98%   BMI 23.42 kg/m   Physical Exam Vitals and nursing note reviewed.  Constitutional:      Appearance: Normal appearance.  HENT:     Head: Normocephalic and atraumatic.     Right Ear: External ear normal.     Left Ear: External ear normal.     Nose: Nose normal.     Mouth/Throat:     Mouth: Mucous membranes are moist.     Pharynx: Oropharynx is clear.  Eyes:     Extraocular Movements: Extraocular movements intact.      Conjunctiva/sclera: Conjunctivae normal.     Pupils: Pupils are equal, round, and reactive to light.  Cardiovascular:     Rate and Rhythm: Tachycardia present. Rhythm irregular.     Pulses: Normal pulses.     Heart sounds: Normal heart sounds.  Pulmonary:     Effort: Pulmonary effort is normal.     Breath sounds: Normal breath sounds.  Abdominal:     General: Abdomen is flat. Bowel sounds are normal.     Palpations: Abdomen is soft.  Musculoskeletal:        General: Normal range of motion.     Cervical back: Normal range of motion and neck supple.  Skin:    General: Skin is warm.     Capillary Refill: Capillary refill takes less than 2 seconds.  Neurological:     General: No focal deficit present.     Mental Status: She is alert and oriented to person, place, and time.  Psychiatric:        Mood and Affect: Mood normal.        Behavior: Behavior normal.        Thought Content: Thought content normal.        Judgment: Judgment normal.     ED Results / Procedures / Treatments   Labs (all labs ordered are listed, but only abnormal results are displayed) Labs Reviewed  CBC - Abnormal; Notable for the following components:      Result Value   Hemoglobin 15.9 (*)    HCT 46.5 (*)    All other components within normal limits  TROPONIN I (HIGH SENSITIVITY) - Abnormal; Notable for the following components:   Troponin I (High Sensitivity) 26 (*)    All other components within normal limits  TROPONIN I (HIGH SENSITIVITY) - Abnormal; Notable for the following components:   Troponin I (High Sensitivity) 28 (*)    All other components within normal limits  BASIC METABOLIC PANEL  MAGNESIUM  PROTIME-INR  TSH    EKG EKG Interpretation  Date/Time:  Monday Jul 03 2019 19:44:57 EDT Ventricular Rate:  95 PR Interval:    QRS Duration: 94 QT Interval:  365 QTC Calculation: 459 R Axis:   -12 Text Interpretation: Sinus rhythm LAE, consider biatrial enlargement Abnormal R-wave  progression, early transition Abnormal T, consider ischemia, anterior leads spontaneous conversion to NSR Confirmed by Isla Pence [phone]) on 07/03/2019 8:06:02 PM   Radiology DG  Chest Port 1 View  Result Date: 07/03/2019 CLINICAL DATA:  Patient with atrial fibrillation. EXAM: PORTABLE CHEST 1 VIEW COMPARISON:  Chest radiograph 02/27/2019 FINDINGS: Monitoring leads overlie the patient. Stable cardiac and mediastinal contours. No large area pulmonary consolidation. No pleural effusion or pneumothorax. Thoracic spine degenerative changes. IMPRESSION: No acute cardiopulmonary process. Electronically Signed   By: Lovey Newcomer M.D.   On: 07/03/2019 20:11    Procedures Procedures (including critical care time)  Medications Ordered in ED Medications  0.9 %  sodium chloride infusion ( Intravenous Stopped 07/03/19 2121)  rivaroxaban (XARELTO) tablet 20 mg (20 mg Oral Given 07/03/19 2117)  metoprolol tartrate (LOPRESSOR) injection 5 mg (5 mg Intravenous Given 07/03/19 1944)  rivaroxaban [employer] Reasons) Education Kit for Afib patients ( Does not apply Given 07/03/19 2118)    ED Course  I have reviewed the triage vital signs and the nursing notes.  Pertinent labs & imaging results that were available during my care of the patient were reviewed by me and considered in my medical decision making (see chart for details).    MDM Rules/Calculators/A&P                      Pt's initial EKG showed afib with RVR.  It spontaneously converted to NSR, but with HR in the low 100s.  She was given 5 mg of lopressor IV and HR down to the 80s in NSR.  CHA2DS2/VAS Stroke Risk Points  Current as of 3 minutes ago     4 >= 2 Points: High Risk  1 - 1.99 Points: Medium Risk  0 Points: Low Risk    The patient's score has not changed in the past year.: No Change     Details    This score determines the patient's risk of having a stroke if the  patient has atrial fibrillation.       Points Metrics  0 Has Congestive  Heart Failure:  No    Current as of 3 minutes ago  1 Has Vascular Disease:  Yes    Current as of 3 minutes ago  1 Has Hypertension:  Yes    Current as of 3 minutes ago  1 Age:  61    Current as of 3 minutes ago  0 Has Diabetes:  No    Current as of 3 minutes ago  0 Had Stroke:  No  Had TIA:  No  Had thromboembolism:  No    Current as of 3 minutes ago  1 Female:  Yes    Current as of 3 minutes ago     Pt up and walked to the bathroom.  HR remained NSR and rate in the 70s.  Pt started on Xarelto.  She is d/c home on toprol xl and given a referral to the afib. Clinic.  REturn if worse.   CRITICAL CARE Performed by: Isla Pence   Total critical care time: 30 minutes  Critical care time was exclusive of separately billable procedures and treating other patients.  Critical care was necessary to treat or prevent imminent or life-threatening deterioration.  Critical care was time spent personally by me on the following activities: development of treatment plan with patient and/or surrogate as well as nursing, discussions with consultants, evaluation of patient's response to treatment, examination of patient, obtaining history from patient or surrogate, ordering and performing treatments and interventions, ordering and review of laboratory studies, ordering and review of radiographic studies, pulse oximetry and re-evaluation of patient's  condition.   Final Clinical Impression(s) / ED Diagnoses Final diagnoses:  Atrial fibrillation with RVR ([place])    Rx / DC Orders ED Discharge Orders         Ordered    Amb referral to AFIB Clinic     07/03/19 2053    rivaroxaban (XARELTO) 20 MG TABS tablet  Daily with supper     07/03/19 2207    metoprolol succinate (TOPROL-XL) 25 MG 24 hr tablet  Daily     07/03/19 2207           Isla Pence, MD 07/03/19 3744    Isla Pence, MD 07/03/19 2208

## 2019-07-03 NOTE — ED Triage Notes (Signed)
Pt c/o fast irregular heart beat that started 1 hour PTA. Pt states she had one episode of same last week. Pt noted to have a-fib on EKG during triage. Pt has no prior hx of a-fib. Pt reports associated ShOB, chest tightness.

## 2019-07-04 MED FILL — XARELTO 20 MG TABLET: 20 | 30 days supply | Qty: 30 | Fill #0

## 2019-07-04 MED FILL — METOPROLOL SUCCINATE ER 25: 25 | 60 days supply | Qty: 30 | Fill #0

## 2019-07-05 DIAGNOSIS — H2513 Age-related nuclear cataract, bilateral: Secondary | ICD-10-CM | POA: Diagnosis not present

## 2019-07-05 DIAGNOSIS — Z79899 Other long term (current) drug therapy: Secondary | ICD-10-CM | POA: Diagnosis not present

## 2019-07-12 ENCOUNTER — Telehealth: Payer: Self-pay | Admitting: Cardiology

## 2019-07-12 NOTE — Telephone Encounter (Signed)
Pt c/o medication issue:  1. Name of Medication: rivaroxaban (XARELTO) 20 MG TABS tablet  2. How are you currently taking this medication (dosage and times per day)? 1 tablet at dinner  3. Are you having a reaction (difficulty breathing--STAT)? no  4. What is your medication issue? Patient states she hit the top of the foot last night at 6:00pm. She states it was okay this morning, but started bleeding a little in the shower and when she walks. She would like a nurse to call back to advise on how to make it stop.

## 2019-07-12 NOTE — Telephone Encounter (Signed)
The patient is calling in wondering if she should stop her Xarelto because she has been bleeding from hitting her foot yesterday evening on her table. I let the patient know that she did not need to stop her Xarelto at this time. She states that her foot has been bleeding through the bandages every few hours. The patient denies any other symptoms. She is aware to avoid any NSAIDs and when to give [place] a call back if the bleeding has worsened.

## 2019-07-14 DIAGNOSIS — M1711 Unilateral primary osteoarthritis, right knee: Secondary | ICD-10-CM | POA: Diagnosis not present

## 2019-07-14 DIAGNOSIS — Z96652 Presence of left artificial knee joint: Secondary | ICD-10-CM | POA: Diagnosis not present

## 2019-07-14 DIAGNOSIS — M25561 Pain in right knee: Secondary | ICD-10-CM | POA: Diagnosis not present

## 2019-07-14 DIAGNOSIS — Z471 Aftercare following joint replacement surgery: Secondary | ICD-10-CM | POA: Diagnosis not present

## 2019-07-18 DIAGNOSIS — S91311A Laceration without foreign body, right foot, initial encounter: Secondary | ICD-10-CM | POA: Diagnosis not present

## 2019-07-20 ENCOUNTER — Ambulatory Visit: Payer: Medicare HMO | Admitting: Medical

## 2019-07-20 ENCOUNTER — Other Ambulatory Visit: Payer: Self-pay

## 2019-07-20 ENCOUNTER — Encounter: Payer: Self-pay | Admitting: Medical

## 2019-07-20 VITALS — BP 120/70 | HR 74 | Temp 97.2°F | Ht 68.0 in | Wt 153.0 lb

## 2019-07-20 DIAGNOSIS — E7800 Pure hypercholesterolemia, unspecified: Secondary | ICD-10-CM | POA: Diagnosis not present

## 2019-07-20 DIAGNOSIS — R0683 Snoring: Secondary | ICD-10-CM | POA: Diagnosis not present

## 2019-07-20 DIAGNOSIS — I2584 Coronary atherosclerosis due to calcified coronary lesion: Secondary | ICD-10-CM

## 2019-07-20 DIAGNOSIS — R400 Somnolence: Secondary | ICD-10-CM | POA: Diagnosis not present

## 2019-07-20 DIAGNOSIS — I480 Paroxysmal atrial fibrillation: Secondary | ICD-10-CM | POA: Diagnosis not present

## 2019-07-20 DIAGNOSIS — R29818 Other symptoms and signs involving the nervous system: Secondary | ICD-10-CM

## 2019-07-20 DIAGNOSIS — I2510 Atherosclerotic heart disease of native coronary artery without angina pectoris: Secondary | ICD-10-CM | POA: Diagnosis not present

## 2019-07-20 MED ORDER — RIVAROXABAN 20 MG PO TABS: 20.0000 mg | ORAL_TABLET | Freq: Every day | ORAL | 3 refills | Status: DC

## 2019-07-20 MED ORDER — METOPROLOL SUCCINATE 25 MG PO CS24: EXTENDED_RELEASE_CAPSULE | ORAL | 3 refills | Status: DC

## 2019-07-20 NOTE — Patient Instructions (Addendum)
Medication Instructions:   You make take an additional dose of Metoprolol Succinate if your Heart Rate is persistently greater than 120 BPM  *If you need a refill on your cardiac medications before your next appointment, please call your pharmacy*  Lab Work: NONE ordered at this time of appointment   If you have labs (blood work) drawn today and your tests are completely normal, you will receive your results only by: Marland Kitchen MyChart Message (if you have MyChart) OR . A paper copy in the mail If you have any lab test that is abnormal or we need to change your treatment, we will call you to review the results.  Testing/Procedures: Your physician has recommended that you have a split night sleep study. This test records several body functions during sleep, including: brain activity, eye movement, oxygen and carbon dioxide blood levels, heart rate and rhythm, breathing rate and rhythm, the flow of air through your mouth and nose, snoring, body muscle movements, and chest and belly movement.   Follow-Up: At [facility], you and your health needs are our priority.  As part of our continuing mission to provide you with exceptional heart care, we have created designated Provider Care Teams.  These Care Teams include your primary Cardiologist (physician) and Advanced Practice Providers (APPs -  Physician Assistants and Nurse Practitioners) who all work together to provide you with the care you need, when you need it.  Your next appointment:   3 month(s)  The format for your next appointment:   In Person  Provider:   Peter Martinique, MD  Other Instructions   Decrease your alcohol intake to no more than 1 drink per day

## 2019-07-20 NOTE — Progress Notes (Signed)
Cardiology Office Note   Date:  07/20/2019   ID:  [name] [name], [name] [name] [birth year], MRN [id number]  PCP:  Jani Gravel, MD  Cardiologist:  Peter Martinique, MD EP: None  Chief Complaint  Patient presents with  . Follow-up    newly diagnosed atrial fibrillation      History of Present Illness: [name] [name] is a 74 y.o. female with PMH of paroxysmal atrial fibrillation, coronary artery calcifications on CT, anxiety/panic attacks, RA, and GERD, who presents for post-ED visit follow-up.   She was last evaluated by cardiology at an outpatient visit with Dr. Martinique 04/13/19 to again evaluate coronary artery calcifications seen on CT Chest during a recent admission for COVID-19 PNA 02/2019. She continued to have SOB following her COVID infection. She was recommended to undergo a NST which showed EF 64% and no evidence of ischemia. Aggressive risk factor modifications were encouraged. Her last echocardiogram in 02/2019 which showed EF  60-65%, mild LVH, no significant valvular abnormalities, and normal LAE.   She presented to [facility] ED 07/03/19 with complaints of rapid palpitations with associated chest tightness and SOB. Initial EKG on arrival showed atrial fibrillation with RVR. She spontaneously converted to NSR. She was started on xarelto for stroke ppx and metoprolol succinate for rate control. She contacted our office 07/12/19 to report bleeding from a foot injury and was recommended to avoid NSAID use to minimize bleeding. Subsequently seen by urgent care and placed on antibiotics for cellulitis of the foot.   She presents today for post-ED visit follow-up. She has not had any recurrent fast heart rates since her atrial fibrillation diagnosis a couple weeks ago. Prior to her ED visit she had one other notable episode where she felt off, check her pulseox and HR was 180. She did not seek medical attention at that time. Both episodes occurred after a night of heavy drinking. Historically  she has used alcohol as a coping mechanism after the loss of 2 children in the past 10 years. She has cut back her ETOH use over the past couple weeks. She has had some intermittent lightheadedness since starting metoprolol. No syncopal events. No complaints of chest pain, SOB, LE edema, orthopnea, PND, or palpitations. We discussed common causes of Afib including CAD, ETOH use, thyroid dysfunction, and sleep apnea. Her TSH was normal in the ED. She recently underwent a stress test earlier this year which was reassuring. We discussed limiting ETOH use as this seems to be a trigger. And she reports daytime somnolence, snoring, and insomnia. She gets botox injections and asks about expectations for bleeding now that she is on xarelto. We discussed the importance of xarelto compliance to minimize stroke risk. She has some familiarity with atrial fibrillation prior to her diagnosis as her older sister has this condition.    Past Medical History:  Diagnosis Date  . Anxiety disorder   . Arthritis   . Depression   . Flu    01/2017   . GERD (gastroesophageal reflux disease)   . Panic attack    panic attacks   . RA (rheumatoid arthritis) ([place])   . Urinary tract infection    hx of 02/2016   . Vitamin D deficiency     Past Surgical History:  Procedure Laterality Date  . ABDOMINAL HYSTERECTOMY    . ANTERIOR AND POSTERIOR REPAIR WITH SACROSPINOUS FIXATION N/A 12/13/2014   Procedure: ANTERIOR AND POSTERIOR REPAIR WITH SACROSPINOUS LIGAMENT SUSPENSION;  Surgeon: Everlene Farrier, MD;  Location: [facility];  Service: Gynecology;  Laterality: N/A;  . bladder tack surgery     . BRONCHOSCOPY    . COLONOSCOPY    . TOTAL KNEE ARTHROPLASTY Left 04/07/2016   Procedure: LEFT  TOTAL KNEE ARTHROPLASTY;  Surgeon: Paralee Cancel, MD;  Location: [facility];  Service: Orthopedics;  Laterality: Left;     Current Outpatient Medications  Medication Sig Dispense Refill  . cephALEXin (KEFLEX) 500 MG capsule Take by mouth.    .  hydroxychloroquine (PLAQUENIL) 200 MG tablet TAKE 1 TABLET BY MOUTH EVERY DAY WITH FOOD OR MILK    . acetaminophen (TYLENOL) 325 MG tablet by Does not apply route.    . citalopram (CELEXA) 20 MG tablet Take 20 mg by mouth daily.  0  . hydroxychloroquine (PLAQUENIL) 200 MG tablet Take 200 mg by mouth daily.    . Metoprolol Succinate 25 MG CS24 Take 1 tablet daily. May take an additional tablet if heart rate is persistently greater than 120. 96 capsule 3  . rivaroxaban (XARELTO) 20 MG TABS tablet Take 1 tablet (20 mg total) by mouth daily. 90 tablet 3  . sodium chloride (OCEAN) 0.65 % SOLN nasal spray Place 1 spray into both nostrils every 6 (six) hours as needed for congestion.     No current facility-administered medications for this visit.    Allergies:   Patient has no active allergies.    Social History:  The patient  reports that she has quit smoking. She has never used smokeless tobacco. She reports current alcohol use. She reports that she does not use drugs.   Family History:  The patient's family history includes Breast cancer in her maternal aunt and sister; Heart attack in her mother; Melanoma in her niece.    ROS:  Please see the history of present illness.   Otherwise, review of systems are positive for none.   All other systems are reviewed and negative.    PHYSICAL EXAM: VS:  BP 120/70   Pulse 74   Temp (!) 97.2 F (36.2 C)   Ht 5\' 8"  (1.727 m)   Wt 153 lb (69.4 kg)   SpO2 92%   BMI 23.26 kg/m  , BMI Body mass index is 23.26 kg/m. GEN: Well nourished, well developed, in no acute distress HEENT: sclera anicteric Neck: no JVD, carotid bruits, or masses Cardiac: RRR; no murmurs, rubs, or gallops, no edema  Respiratory:  clear to auscultation bilaterally, normal work of breathing GI: soft, nontender, nondistended, + BS MS: no deformity or atrophy Skin: warm and dry, no rash Neuro:  Strength and sensation are intact Psych: euthymic mood, full affect   EKG:  EKG  is ordered today. The ekg ordered today demonstrates NSR, rate 74 bpm, non-specific T wave abnormalities, no STE/D, no significant change from previous.    Recent Labs: 07/03/2019: BUN 20; Creatinine, Ser 0.71; Hemoglobin 15.9; Magnesium 2.2; Platelets 234; Potassium 3.8; Sodium 135; TSH 4.224    Lipid Panel    Component Value Date/Time   CHOL 209 (H) 10/05/2014 0217   TRIG 42 10/05/2014 0217   HDL 76 10/05/2014 0217   CHOLHDL 2.8 10/05/2014 0217   VLDL 8 10/05/2014 0217   LDLCALC 125 (H) 10/05/2014 0217      Wt Readings from Last 3 Encounters:  07/20/19 153 lb (69.4 kg)  07/03/19 154 lb (69.9 kg)  04/20/19 152 lb (68.9 kg)      Other studies Reviewed: Additional studies/ records that were reviewed today include:    NST 04/2019: Study  Highlights   Lexiscan stress EKG nondiagnostic due to baseline changes  Myovue scan with normal perfusion No ischemia  LVEF 64%  This is a low risk study.   Echocardiogram 02/2019: Normal left ventricular size and systolic function with no appreciable segmental abnormality. Ejection fraction is visually estimated at 60-65% Mild concentric left ventricular hypertrophy No significant valvular abnormalities.  Echocardiogram 2016: Study Conclusions   - Left ventricle: The cavity size was normal. There was mild focal  basal and mild concentric hypertrophy of the septum. Systolic  function was normal. The estimated ejection fraction was in the  range of 55% to 60%. Wall motion was normal; there were no  regional wall motion abnormalities. Left ventricular diastolic  function parameters were normal.  - Atrial septum: No defect or patent foramen ovale was identified.     ASSESSMENT AND PLAN:  1. Paroxysmal atrial fibrillation: EKG today with NSR. Recent stress test was non-ischemic. TSH 07/03/19 was wnl. Echo 02/2019 with normal EF, normal LA size, and no significant valvular abnormalities.  - Continue xarelto for stroke  ppx - CHA2DS2-VASc Score of 3 (Vascular, Age 71-74, and Female) - Continue metoprolol succinate for rate control. We discussed taking an additional dose of metoprolol if HR is persistently >120.  - Will check a sleep study to r/o OSA as possible contributor - Encouraged cutting back on alcohol use as this seems to trigger her Afib.  2. Coronary artery calcifications on CT: NST 04/2019 was without ischemia. - Continue aggressive risk factor modifications  3. HLD: No recent lipids; goal <70  - Continue management per PCP  4. Suspected OSA: patient reports daytime somnolence, snoring, and insomina - Would benefit from a sleep study in light of recently diagnosed atrial fibrillation   Current medicines are reviewed at length with the patient today.  The patient does not have concerns regarding medicines.  The following changes have been made:  As above  Labs/ tests ordered today include:   Orders Placed This Encounter  Procedures  . EKG 12-Lead  . Split night study     Disposition:   FU with Dr. Martinique in 3 months  Signed, Abigail Butts, PA-C  07/20/2019 1:05 PM

## 2019-07-21 ENCOUNTER — Other Ambulatory Visit: Payer: Self-pay | Admitting: Pharmacist Clinician (PhC)/ Clinical Pharmacy Specialist

## 2019-07-21 MED ORDER — RIVAROXABAN 20 MG PO TABS: 20.0000 mg | ORAL_TABLET | Freq: Every day | ORAL | 1 refills | Status: DC

## 2019-07-21 NOTE — Telephone Encounter (Signed)
F69.4 kg, SCr 0.71 (5/21), LOV Martinique 2/21

## 2019-07-25 DIAGNOSIS — M222X1 Patellofemoral disorders, right knee: Secondary | ICD-10-CM | POA: Diagnosis not present

## 2019-07-25 DIAGNOSIS — M25561 Pain in right knee: Secondary | ICD-10-CM | POA: Diagnosis not present

## 2019-07-27 DIAGNOSIS — M25561 Pain in right knee: Secondary | ICD-10-CM | POA: Diagnosis not present

## 2019-07-28 ENCOUNTER — Other Ambulatory Visit: Payer: Self-pay | Admitting: Cardiology

## 2019-07-28 MED ORDER — METOPROLOL SUCCINATE 25 MG PO CS24: EXTENDED_RELEASE_CAPSULE | ORAL | 1 refills | Status: DC

## 2019-07-28 NOTE — Telephone Encounter (Signed)
*  STAT* If patient is at the pharmacy, call can be transferred to refill team.   1. Which medications need to be refilled? (please list name of each medication and dose if known)  Metoprolol Succinate 25 MG CS24  2. Which pharmacy/location (including street and city if local pharmacy) is medication to be sent to? [place]   3. Do they need a 30 day or 90 day supply? 90 day supply

## 2019-07-28 NOTE — Telephone Encounter (Signed)
We sent Xarelto refill 3 days ago.

## 2019-07-31 DIAGNOSIS — M1711 Unilateral primary osteoarthritis, right knee: Secondary | ICD-10-CM | POA: Diagnosis not present

## 2019-07-31 DIAGNOSIS — M25561 Pain in right knee: Secondary | ICD-10-CM | POA: Diagnosis not present

## 2019-08-15 DIAGNOSIS — M2550 Pain in unspecified joint: Secondary | ICD-10-CM | POA: Diagnosis not present

## 2019-08-15 DIAGNOSIS — Z6823 Body mass index (BMI) 23.0-23.9, adult: Secondary | ICD-10-CM | POA: Diagnosis not present

## 2019-08-15 DIAGNOSIS — M0579 Rheumatoid arthritis with rheumatoid factor of multiple sites without organ or systems involvement: Secondary | ICD-10-CM | POA: Diagnosis not present

## 2019-08-15 DIAGNOSIS — M8580 Other specified disorders of bone density and structure, unspecified site: Secondary | ICD-10-CM | POA: Diagnosis not present

## 2019-08-15 DIAGNOSIS — M150 Primary generalized (osteo)arthritis: Secondary | ICD-10-CM | POA: Diagnosis not present

## 2019-08-28 ENCOUNTER — Telehealth: Payer: Self-pay | Admitting: Cardiology

## 2019-08-28 DIAGNOSIS — M25561 Pain in right knee: Secondary | ICD-10-CM | POA: Diagnosis not present

## 2019-08-28 DIAGNOSIS — M1711 Unilateral primary osteoarthritis, right knee: Secondary | ICD-10-CM | POA: Diagnosis not present

## 2019-08-28 MED ORDER — METOPROLOL SUCCINATE ER 25 MG PO TB24
25.0000 mg | ORAL_TABLET | Freq: Every day | ORAL | 3 refills | Status: DC
Start: 2019-08-28 — End: 2019-08-30

## 2019-08-28 NOTE — Telephone Encounter (Signed)
New Rx for Metoprolol 25 mg tablets sent to pharmacy vs. Capsule.

## 2019-08-28 NOTE — Telephone Encounter (Signed)
Pt c/o medication issue:  1. Name of Medication: Metoprolol Succinate 25 MG CS24  2. How are you currently taking this medication (dosage and times per day)? One daily  3. Are you having a reaction (difficulty breathing--STAT)? no  4. What is your medication issue? [employer] calling stating the prescription was sen for capsules, but the patient's insurance will only cover the tablet. They are requesting a 30 day of the tablets to be sent to [facility] on [address], and a 90 day of the tablets sent to [facility].

## 2019-08-30 ENCOUNTER — Other Ambulatory Visit: Payer: Self-pay | Admitting: Cardiology

## 2019-08-30 MED ORDER — METOPROLOL SUCCINATE ER 25 MG PO TB24: 25.0000 mg | ORAL_TABLET | Freq: Every day | ORAL | 0 refills | Status: DC

## 2019-08-30 NOTE — Telephone Encounter (Signed)
*  STAT* If patient is at the pharmacy, call can be transferred to refill team.   1. Which medications need to be refilled? (please list name of each medication and dose if known) metoprolol succinate (TOPROL-XL) 25 MG 24 hr tablet  2. Which pharmacy/location (including street and city if local pharmacy) is medication to be sent to? [id number] - [place], [place] - [address]  3. Do they need a 30 day or 90 day supply? 30 day  Patient is out of medication. She is going out of town tomorrow and her refill from [place] will not arrive in time.

## 2019-09-13 DIAGNOSIS — H2513 Age-related nuclear cataract, bilateral: Secondary | ICD-10-CM | POA: Diagnosis not present

## 2019-09-13 DIAGNOSIS — Z79899 Other long term (current) drug therapy: Secondary | ICD-10-CM | POA: Diagnosis not present

## 2019-10-19 NOTE — Progress Notes (Signed)
Cardiology Office Note   Date:  10/20/2019   ID:  [name] [name], [name] [name] 1945-05-17, MRN [id number]  PCP:  Jani Gravel, MD  Cardiologist:  Clevland Cork Martinique, MD EP: None  Chief Complaint  Patient presents with   Atrial Fibrillation      History of Present Illness: [name] [name] is a 74 y.o. female with PMH of paroxysmal atrial fibrillation, coronary artery calcifications on CT, anxiety/panic attacks, RA, and GERD, who is seen for follow-up.   She was last evaluated  04/13/19 to evaluate coronary artery calcifications seen on CT Chest during a recent admission for COVID-19 PNA 02/2019. She continued to have SOB following her COVID infection. She was recommended to undergo a NST which showed EF 64% and no evidence of ischemia. Aggressive risk factor modifications were encouraged. Her last echocardiogram in 02/2019 which showed EF  60-65%, mild LVH, no significant valvular abnormalities, and normal LAE.   She presented to [facility] ED 07/03/19 with complaints of rapid palpitations with associated chest tightness and SOB. Initial EKG on arrival showed atrial fibrillation with RVR. She spontaneously converted to NSR. She was started on xarelto for stroke ppx and metoprolol succinate for rate control. She contacted our office 07/12/19 to report bleeding from a foot injury and was recommended to avoid NSAID use to minimize bleeding. Subsequently seen by urgent care and placed on antibiotics for cellulitis of the foot. She was seen in follow up. Concerned that Etoh use was a trigger for her arrhythmia. Sleep study recommended but never done  On follow up today she is doing well. Has no symptomatic spells of afib. Highest HR around 110. Typically HR in 80s. She is limited in her activity due to a bad knee. States she will need TKR but is hoping to put it off until January. She is limiting her Etoh use.    Past Medical History:  Diagnosis Date   Anxiety disorder    Arthritis    Depression     Flu    01/2017    GERD (gastroesophageal reflux disease)    Panic attack    panic attacks    RA (rheumatoid arthritis) ([place])    Urinary tract infection    hx of 02/2016    Vitamin D deficiency     Past Surgical History:  Procedure Laterality Date   ABDOMINAL HYSTERECTOMY     ANTERIOR AND POSTERIOR REPAIR WITH SACROSPINOUS FIXATION N/A 12/13/2014   Procedure: ANTERIOR AND POSTERIOR REPAIR WITH SACROSPINOUS LIGAMENT SUSPENSION;  Surgeon: Everlene Farrier, MD;  Location: [facility];  Service: Gynecology;  Laterality: N/A;   bladder tack surgery      BRONCHOSCOPY     COLONOSCOPY     TOTAL KNEE ARTHROPLASTY Left 04/07/2016   Procedure: LEFT  TOTAL KNEE ARTHROPLASTY;  Surgeon: Paralee Cancel, MD;  Location: [facility];  Service: Orthopedics;  Laterality: Left;     Current Outpatient Medications  Medication Sig Dispense Refill   Acetaminophen (TYLENOL ARTHRITIS EXT RELIEF PO) Take by mouth. Take 2 tablets twice a day     citalopram (CELEXA) 20 MG tablet Take 20 mg by mouth daily.  0   hydroxychloroquine (PLAQUENIL) 200 MG tablet Take 200 mg by mouth daily.     hydroxychloroquine (PLAQUENIL) 200 MG tablet TAKE 1 TABLET BY MOUTH EVERY DAY WITH FOOD OR MILK     metoprolol succinate (TOPROL-XL) 25 MG 24 hr tablet TAKE 1 TABLET BY MOUTH DAILY WITH OR IMMEDIATELY FOLLOWING A MEAL. MAY TAKE AN ADDITIONAL TABLET  IF HEART RATE IS PERSISTENTLY GREATER THAN 120. 90 tablet 3   rivaroxaban (XARELTO) 20 MG TABS tablet Take 1 tablet (20 mg total) by mouth daily. 90 tablet 1   sodium chloride (OCEAN) 0.65 % SOLN nasal spray Place 1 spray into both nostrils every 6 (six) hours as needed for congestion.     No current facility-administered medications for this visit.    Allergies:   Patient has no active allergies.    Social History:  The patient  reports that she has quit smoking. She has never used smokeless tobacco. She reports current alcohol use. She reports that she does not use drugs.    Family History:  The patient's family history includes Breast cancer in her maternal aunt and sister; Heart attack in her mother; Melanoma in her niece.    ROS:  Please see the history of present illness.   Otherwise, review of systems are positive for none.   All other systems are reviewed and negative.    PHYSICAL EXAM: VS:  BP 120/72    Pulse 100    Ht 5\' 8"  (1.727 m)    Wt 151 lb 6 oz (68.7 kg)    BMI 23.02 kg/m  , BMI Body mass index is 23.02 kg/m. GEN: Well nourished, well developed, in no acute distress HEENT: sclera anicteric Neck: no JVD, carotid bruits, or masses Cardiac: RRR; no murmurs, rubs, or gallops, no edema  Respiratory:  clear to auscultation bilaterally, normal work of breathing GI: soft, nontender, nondistended, + BS MS: no deformity or atrophy Skin: warm and dry, no rash Neuro:  Strength and sensation are intact Psych: euthymic mood, full affect   EKG:  EKG is not ordered today.    Recent Labs: 07/03/2019: BUN 20; Creatinine, Ser 0.71; Hemoglobin 15.9; Magnesium 2.2; Platelets 234; Potassium 3.8; Sodium 135; TSH 4.224    Lipid Panel    Component Value Date/Time   CHOL 209 (H) 10/05/2014 0217   TRIG 42 10/05/2014 0217   HDL 76 10/05/2014 0217   CHOLHDL 2.8 10/05/2014 0217   VLDL 8 10/05/2014 0217   LDLCALC 125 (H) 10/05/2014 0217    dated 06/02/19: LDL 125. Triglycerides 41,   Wt Readings from Last 3 Encounters:  10/20/19 151 lb 6 oz (68.7 kg)  07/20/19 153 lb (69.4 kg)  07/03/19 154 lb (69.9 kg)      Other studies Reviewed: Additional studies/ records that were reviewed today include:    NST 04/2019: Study Highlights   Lexiscan stress EKG nondiagnostic due to baseline changes  Myovue scan with normal perfusion No ischemia  LVEF 64%  This is a low risk study.   Echocardiogram 02/2019: Normal left ventricular size and systolic function with no appreciable segmental abnormality. Ejection fraction is visually estimated at  60-65% Mild concentric left ventricular hypertrophy No significant valvular abnormalities.  Echocardiogram 2016: Study Conclusions   - Left ventricle: The cavity size was normal. There was mild focal  basal and mild concentric hypertrophy of the septum. Systolic  function was normal. The estimated ejection fraction was in the  range of 55% to 60%. Wall motion was normal; there were no  regional wall motion abnormalities. Left ventricular diastolic  function parameters were normal.  - Atrial septum: No defect or patent foramen ovale was identified.     ASSESSMENT AND PLAN:  1. Paroxysmal atrial fibrillation: no symptomatic recurrence. Monitors HR with pulse ox and her phone.   prior stress test was non-ischemic. TSH 07/03/19 was wnl. Echo  02/2019 with normal EF, normal LA size, and no significant valvular abnormalities.  - Continue xarelto for stroke ppx - CHA2DS2-VASc Score of 3 (Vascular, Age 78-74, and Female) - Continue metoprolol succinate for rate control. We discussed taking an additional dose of metoprolol if HR is persistently >120.  - Will hold off on sleep study unless she has more frequent break through.  - limit Etoh intake.   2. Coronary artery calcifications on CT: NST 04/2019 was without ischemia. - Continue aggressive risk factor modifications  3. HLD: would recommend statin use. Labs followed by primary care.  4. Suspected OSA: patient reports  snoring, and occ.  insomina - consider sleep study if recurrent Afib.    Current medicines are reviewed at length with the patient today.  The patient does not have concerns regarding medicines.  The following changes have been made:  As above  Labs/ tests ordered today include:   No orders of the defined types were placed in this encounter.    Disposition:   FU with Dr. Martinique in 6 months  Signed, Fariha Goto Martinique, MD  10/20/2019 2:03 PM

## 2019-10-20 ENCOUNTER — Other Ambulatory Visit: Payer: Self-pay

## 2019-10-20 ENCOUNTER — Encounter: Payer: Self-pay | Admitting: Cardiology

## 2019-10-20 ENCOUNTER — Ambulatory Visit: Payer: Medicare HMO | Admitting: Cardiology

## 2019-10-20 VITALS — BP 120/72 | HR 100 | Ht 68.0 in | Wt 151.4 lb

## 2019-10-20 DIAGNOSIS — I2510 Atherosclerotic heart disease of native coronary artery without angina pectoris: Secondary | ICD-10-CM | POA: Diagnosis not present

## 2019-10-20 DIAGNOSIS — I480 Paroxysmal atrial fibrillation: Secondary | ICD-10-CM | POA: Diagnosis not present

## 2019-10-20 DIAGNOSIS — E7800 Pure hypercholesterolemia, unspecified: Secondary | ICD-10-CM | POA: Diagnosis not present

## 2019-10-20 DIAGNOSIS — I2584 Coronary atherosclerosis due to calcified coronary lesion: Secondary | ICD-10-CM

## 2019-11-03 DIAGNOSIS — Z20828 Contact with and (suspected) exposure to other viral communicable diseases: Secondary | ICD-10-CM | POA: Diagnosis not present

## 2019-11-15 DIAGNOSIS — M8580 Other specified disorders of bone density and structure, unspecified site: Secondary | ICD-10-CM | POA: Diagnosis not present

## 2019-11-15 DIAGNOSIS — M150 Primary generalized (osteo)arthritis: Secondary | ICD-10-CM | POA: Diagnosis not present

## 2019-11-15 DIAGNOSIS — M0579 Rheumatoid arthritis with rheumatoid factor of multiple sites without organ or systems involvement: Secondary | ICD-10-CM | POA: Diagnosis not present

## 2019-11-15 DIAGNOSIS — Z6823 Body mass index (BMI) 23.0-23.9, adult: Secondary | ICD-10-CM | POA: Diagnosis not present

## 2019-11-15 DIAGNOSIS — M25512 Pain in left shoulder: Secondary | ICD-10-CM | POA: Diagnosis not present

## 2019-12-22 DIAGNOSIS — M25561 Pain in right knee: Secondary | ICD-10-CM | POA: Diagnosis not present

## 2019-12-22 DIAGNOSIS — M1711 Unilateral primary osteoarthritis, right knee: Secondary | ICD-10-CM | POA: Diagnosis not present

## 2020-01-10 ENCOUNTER — Other Ambulatory Visit: Payer: Self-pay | Admitting: Cardiology

## 2020-02-02 ENCOUNTER — Other Ambulatory Visit: Payer: Self-pay | Admitting: Internal Medicine

## 2020-02-02 ENCOUNTER — Ambulatory Visit
Admission: RE | Admit: 2020-02-02 | Discharge: 2020-02-02 | Disposition: A | Discharge status: Home / Self Care | Payer: Medicare HMO | Source: Ambulatory Visit | Attending: Internal Medicine | Admitting: Internal Medicine

## 2020-02-02 DIAGNOSIS — R059 Cough, unspecified: Secondary | ICD-10-CM | POA: Diagnosis not present

## 2020-02-02 DIAGNOSIS — W19XXXA Unspecified fall, initial encounter: Secondary | ICD-10-CM

## 2020-02-02 DIAGNOSIS — Z043 Encounter for examination and observation following other accident: Secondary | ICD-10-CM | POA: Diagnosis not present

## 2020-02-02 DIAGNOSIS — R42 Dizziness and giddiness: Secondary | ICD-10-CM

## 2020-02-02 DIAGNOSIS — R0989 Other specified symptoms and signs involving the circulatory and respiratory systems: Secondary | ICD-10-CM | POA: Diagnosis not present

## 2020-02-02 DIAGNOSIS — R918 Other nonspecific abnormal finding of lung field: Secondary | ICD-10-CM | POA: Diagnosis not present

## 2020-02-02 DIAGNOSIS — J439 Emphysema, unspecified: Secondary | ICD-10-CM | POA: Diagnosis not present

## 2020-02-02 DIAGNOSIS — J189 Pneumonia, unspecified organism: Secondary | ICD-10-CM | POA: Diagnosis not present

## 2020-02-22 DIAGNOSIS — M150 Primary generalized (osteo)arthritis: Secondary | ICD-10-CM | POA: Diagnosis not present

## 2020-02-22 DIAGNOSIS — M25512 Pain in left shoulder: Secondary | ICD-10-CM | POA: Diagnosis not present

## 2020-02-22 DIAGNOSIS — Z6823 Body mass index (BMI) 23.0-23.9, adult: Secondary | ICD-10-CM | POA: Diagnosis not present

## 2020-02-22 DIAGNOSIS — M8580 Other specified disorders of bone density and structure, unspecified site: Secondary | ICD-10-CM | POA: Diagnosis not present

## 2020-02-22 DIAGNOSIS — M0579 Rheumatoid arthritis with rheumatoid factor of multiple sites without organ or systems involvement: Secondary | ICD-10-CM | POA: Diagnosis not present

## 2020-04-15 NOTE — Progress Notes (Unsigned)
Cardiology Office Note   Date:  04/18/2020   ID:  [name], [name] 1945-08-18, MRN [id number]  PCP:  [name] Gravel, MD  Cardiologist:  [name] Martinique, MD EP: None  Chief Complaint  Patient presents with  . Atrial Fibrillation      History of Present Illness: [name] [name] is a 75 y.o. female with PMH of paroxysmal atrial fibrillation, coronary artery calcifications on CT, anxiety/panic attacks, RA, and GERD, who is seen for follow-up.   She was last evaluated  04/13/19 to evaluate coronary artery calcifications seen on CT Chest during a recent admission for COVID-19 PNA 02/2019. She continued to have SOB following her COVID infection. She was recommended to undergo a NST which showed EF 64% and no evidence of ischemia. Aggressive risk factor modifications were encouraged. Her last echocardiogram in 02/2019 which showed EF  60-65%, mild LVH, no significant valvular abnormalities, and normal LAE.   She presented to [facility] ED 07/03/19 with complaints of rapid palpitations with associated chest tightness and SOB. Initial EKG on arrival showed atrial fibrillation with RVR. She spontaneously converted to NSR. She was started on xarelto for stroke ppx and metoprolol succinate for rate control.   On follow up today she is doing well. She has had a couple of episodes of AFib. May take an extra Toprol.  Highest HR around 110. Typically HR in 80s. She is limited in her activity due to a bad knee. She notes a bronchitis like illness in December. Took Mucinex but this made her dizzy. She fell and hit her head. CT was negative for bleed. Notes her right leg goes to sleep if she crosses her leg. If she then tries to walk her leg is numb and she will fall. No chest pain or dyspnea.   Past Medical History:  Diagnosis Date  . Anxiety disorder   . Arthritis   . Depression   . Flu    01/2017   . GERD (gastroesophageal reflux disease)   . Panic attack    panic attacks   . RA (rheumatoid  arthritis) ([place])   . Urinary tract infection    hx of 02/2016   . Vitamin D deficiency     Past Surgical History:  Procedure Laterality Date  . ABDOMINAL HYSTERECTOMY    . ANTERIOR AND POSTERIOR REPAIR WITH SACROSPINOUS FIXATION N/A 12/13/2014   Procedure: ANTERIOR AND POSTERIOR REPAIR WITH SACROSPINOUS LIGAMENT SUSPENSION;  Surgeon: Everlene Farrier, MD;  Location: [facility];  Service: Gynecology;  Laterality: N/A;  . bladder tack surgery     . BRONCHOSCOPY    . COLONOSCOPY    . TOTAL KNEE ARTHROPLASTY Left 04/07/2016   Procedure: LEFT  TOTAL KNEE ARTHROPLASTY;  Surgeon: Paralee Cancel, MD;  Location: [facility];  Service: Orthopedics;  Laterality: Left;     Current Outpatient Medications  Medication Sig Dispense Refill  . Acetaminophen (TYLENOL ARTHRITIS EXT RELIEF PO) Take by mouth. Take 2 tablets twice a day    . citalopram (CELEXA) 20 MG tablet Take 20 mg by mouth daily.  0  . hydroxychloroquine (PLAQUENIL) 200 MG tablet TAKE 1 TABLET BY MOUTH EVERY DAY WITH FOOD OR MILK    . rosuvastatin (CRESTOR) 5 MG tablet Take 1 tablet (5 mg total) by mouth daily. 90 tablet 3  . sodium chloride (OCEAN) 0.65 % SOLN nasal spray Place 1 spray into both nostrils every 6 (six) hours as needed for congestion.    . metoprolol succinate (TOPROL-XL) 25 MG 24 hr  tablet TAKE 1 TABLET BY MOUTH DAILY WITH OR IMMEDIATELY FOLLOWING A MEAL. MAY TAKE AN ADDITIONAL TABLET IF HEART RATE IS PERSISTENTLY GREATER THAN 120. 90 tablet 3  . rivaroxaban (XARELTO) 20 MG TABS tablet Take 1 tablet (20 mg total) by mouth daily. 90 tablet 3   No current facility-administered medications for this visit.    Allergies:   Patient has no active allergies.    Social History:  The patient  reports that she has quit smoking. She has never used smokeless tobacco. She reports current alcohol use. She reports that she does not use drugs.   Family History:  The patient's family history includes Breast cancer in her maternal aunt and sister;  Heart attack in her mother; Melanoma in her niece.    ROS:  Please see the history of present illness.   Otherwise, review of systems are positive for none.   All other systems are reviewed and negative.    PHYSICAL EXAM: VS:  BP 130/70   Pulse 69   Ht 5\' 8"  (1.727 m)   Wt 158 lb 9.6 oz (71.9 kg)   SpO2 91%   BMI 24.12 kg/m  , BMI Body mass index is 24.12 kg/m. GEN: Well nourished, well developed, in no acute distress HEENT: sclera anicteric Neck: no JVD, carotid bruits, or masses Cardiac: RRR; no murmurs, rubs, or gallops, no edema  Respiratory:  clear to auscultation bilaterally, normal work of breathing GI: soft, nontender, nondistended, + BS MS: no deformity or atrophy Skin: warm and dry, no rash Neuro:  Strength and sensation are intact Psych: euthymic mood, full affect   EKG:  EKG is not ordered today.   Recent Labs: 07/03/2019: BUN 20; Creatinine, Ser 0.71; Hemoglobin 15.9; Magnesium 2.2; Platelets 234; Potassium 3.8; Sodium 135; TSH 4.224    Lipid Panel    Component Value Date/Time   CHOL 209 (H) 10/05/2014 0217   TRIG 42 10/05/2014 0217   HDL 76 10/05/2014 0217   CHOLHDL 2.8 10/05/2014 0217   VLDL 8 10/05/2014 0217   LDLCALC 125 (H) 10/05/2014 0217    dated 06/02/19: LDL 125. Triglycerides 41  Wt Readings from Last 3 Encounters:  04/18/20 158 lb 9.6 oz (71.9 kg)  10/20/19 151 lb 6 oz (68.7 kg)  07/20/19 153 lb (69.4 kg)      Other studies Reviewed: Additional studies/ records that were reviewed today include:    NST 04/2019: Study Highlights   Lexiscan stress EKG nondiagnostic due to baseline changes  Myovue scan with normal perfusion No ischemia  LVEF 64%  This is a low risk study.   Echocardiogram 02/2019: Normal left ventricular size and systolic function with no appreciable segmental abnormality. Ejection fraction is visually estimated at 60-65% Mild concentric left ventricular hypertrophy No significant valvular  abnormalities.  Echocardiogram 2016: Study Conclusions   - Left ventricle: The cavity size was normal. There was mild focal  basal and mild concentric hypertrophy of the septum. Systolic  function was normal. The estimated ejection fraction was in the  range of 55% to 60%. Wall motion was normal; there were no  regional wall motion abnormalities. Left ventricular diastolic  function parameters were normal.  - Atrial septum: No defect or patent foramen ovale was identified.     ASSESSMENT AND PLAN:  1. Paroxysmal atrial fibrillation: mild rare symptoms.   prior stress test was non-ischemic. TSH 07/03/19 was wnl. Echo 02/2019 with normal EF, normal LA size, and no significant valvular abnormalities.  - Continue xarelto  for stroke ppx - CHA2DS2-VASc Score of 3 (Vascular, Age 76-74, and Female) - Continue metoprolol succinate for rate control. We discussed taking an additional dose of metoprolol if HR is persistently >120.  - limit Etoh intake.   2. Coronary artery calcifications on CT: NST 04/2019 was without ischemia. - Continue aggressive risk factor modifications  3. HLD: would recommend statin use. Especially given coronary calcification, AFib, RA. Will start Crestor 5 mg daily. Check lab in 3 months.     Current medicines are reviewed at length with the patient today.  The patient does not have concerns regarding medicines.  The following changes have been made:  As above  Labs/ tests ordered today include:   No orders of the defined types were placed in this encounter.    Disposition:   FU with Dr. Martinique in one year  Signed, [name] Martinique, MD  04/18/2020 1:46 PM

## 2020-04-18 ENCOUNTER — Other Ambulatory Visit: Payer: Self-pay

## 2020-04-18 ENCOUNTER — Telehealth: Payer: Self-pay | Admitting: Cardiology

## 2020-04-18 ENCOUNTER — Ambulatory Visit: Payer: Medicare Other | Admitting: Cardiology

## 2020-04-18 ENCOUNTER — Encounter: Payer: Self-pay | Admitting: Cardiology

## 2020-04-18 VITALS — BP 130/70 | HR 69 | Ht 68.0 in | Wt 158.6 lb

## 2020-04-18 DIAGNOSIS — I2510 Atherosclerotic heart disease of native coronary artery without angina pectoris: Secondary | ICD-10-CM

## 2020-04-18 DIAGNOSIS — I480 Paroxysmal atrial fibrillation: Secondary | ICD-10-CM

## 2020-04-18 DIAGNOSIS — E7800 Pure hypercholesterolemia, unspecified: Secondary | ICD-10-CM

## 2020-04-18 DIAGNOSIS — I2584 Coronary atherosclerosis due to calcified coronary lesion: Secondary | ICD-10-CM

## 2020-04-18 MED ORDER — ROSUVASTATIN CALCIUM 5 MG PO TABS: 5.0000 mg | ORAL_TABLET | Freq: Every day | ORAL | 11 refills | Status: DC

## 2020-04-18 MED ORDER — RIVAROXABAN 20 MG PO TABS: 20.0000 mg | ORAL_TABLET | Freq: Every day | ORAL | 11 refills | Status: DC

## 2020-04-18 MED ORDER — RIVAROXABAN 20 MG PO TABS: 20.0000 mg | ORAL_TABLET | Freq: Every day | ORAL | 3 refills | Status: DC

## 2020-04-18 MED ORDER — ROSUVASTATIN CALCIUM 5 MG PO TABS: 5.0000 mg | ORAL_TABLET | Freq: Every day | ORAL | 3 refills | Status: DC

## 2020-04-18 MED ORDER — METOPROLOL SUCCINATE ER 25 MG PO TB24: ORAL_TABLET | ORAL | 3 refills | Status: DC

## 2020-04-18 MED ORDER — METOPROLOL SUCCINATE ER 25 MG PO TB24: ORAL_TABLET | ORAL | 11 refills | Status: DC

## 2020-04-18 NOTE — Patient Instructions (Signed)
Start Crestor 5 mg daily for cholesterol.   Continue your other therapy.

## 2020-04-18 NOTE — Telephone Encounter (Signed)
Patient needed medications to be changed to a 30 day fill instead of a 90 day fill

## 2020-05-07 ENCOUNTER — Other Ambulatory Visit: Payer: Self-pay | Admitting: Internal Medicine

## 2020-05-07 DIAGNOSIS — Z1231 Encounter for screening mammogram for malignant neoplasm of breast: Secondary | ICD-10-CM

## 2020-07-01 ENCOUNTER — Ambulatory Visit: Payer: Medicare Other

## 2020-07-01 ENCOUNTER — Other Ambulatory Visit: Payer: Self-pay

## 2020-07-01 ENCOUNTER — Ambulatory Visit
Admission: RE | Admit: 2020-07-01 | Discharge: 2020-07-01 | Disposition: A | Discharge status: Home / Self Care | Payer: Medicare Other | Source: Ambulatory Visit | Attending: Internal Medicine | Admitting: Internal Medicine

## 2020-07-01 DIAGNOSIS — Z1231 Encounter for screening mammogram for malignant neoplasm of breast: Secondary | ICD-10-CM

## 2020-10-11 ENCOUNTER — Telehealth: Payer: Self-pay | Admitting: Cardiology

## 2020-10-11 NOTE — Telephone Encounter (Signed)
   Patient Name: [name]  DOB: Oct 06, 1945 MRN: [id number]  Primary Cardiologist: Peter Martinique, MD  Chart reviewed as part of pre-operative protocol coverage.   Simple dental extractions are considered low risk procedures per guidelines and generally do not require any specific cardiac clearance. It is also generally accepted that for simple extractions and dental cleanings, there is no need to interrupt blood thinner therapy.  SBE prophylaxis is not required for the patient from a cardiac standpoint.  I will route this recommendation to the requesting party via Epic fax function and remove from pre-op pool.  Please call with questions.  Kathyrn Drown, NP 10/11/2020, 2:24 PM

## 2020-10-11 NOTE — Telephone Encounter (Signed)
   [facility] Pre-operative Risk Assessment    Patient Name: [name] [name]  DOB: 1945/11/18 MRN: [id number]  HEARTCARE STAFF:  - IMPORTANT!!!!!! Under Visit Info/Reason for Call, type in Other and utilize the format Clearance MM/DD/YY or Clearance TBD. Do not use dashes or single digits. - Please review there is not already an duplicate clearance open for this procedure. - If request is for dental extraction, please clarify the # of teeth to be extracted. - If the patient is currently at the dentist's office, call Pre-Op Callback Staff (MA/nurse) to input urgent request.  - If the patient is not currently in the dentist office, please route to the Pre-Op pool.  Request for surgical clearance:  What type of surgery is being performed? 1 extraction  When is this surgery scheduled? 11/01/2020  What type of clearance is required (medical clearance vs. Pharmacy clearance to hold med vs. Both)? Medical  Are there any medications that need to be held prior to surgery and how long? no  Practice name and name of physician performing surgery? [facility], Dr. Osa Craver  What is the office phone number? [phone]   7.   What is the office fax number? [phone]  8.   Anesthesia type (None, local, MAC, general) ? Lidocaine, septocaine, epinephrine   Selena Zobro 10/11/2020, 2:14 PM  _________________________________________________________________   (provider comments below)

## 2020-11-26 ENCOUNTER — Encounter: Payer: Self-pay | Admitting: Cardiology

## 2021-03-20 ENCOUNTER — Encounter: Payer: Self-pay | Admitting: Cardiology

## 2021-03-20 NOTE — Telephone Encounter (Signed)
Can you  please  comment on patient question .  Using apple cider vinegar

## 2021-04-08 NOTE — Progress Notes (Signed)
Cardiology Office Note   Date:  04/15/2021   ID:  [name] [name] 01-11-1946, MRN [id number]  PCP:  Jani Gravel, MD  Cardiologist:  Harvest Deist Martinique, MD EP: None  Chief Complaint  Patient presents with   Atrial Fibrillation      History of Present Illness: [name] [name] is a 76 y.o. female with PMH of paroxysmal atrial fibrillation, coronary artery calcifications on CT, anxiety/panic attacks, RA, and GERD, who is seen for follow-up.   She was last evaluated  04/13/19 to evaluate coronary artery calcifications seen on CT Chest during a recent admission for COVID-19 PNA 02/2019. She continued to have SOB following her COVID infection. She was recommended to undergo a NST which showed EF 64% and no evidence of ischemia. Aggressive risk factor modifications were encouraged. Her last echocardiogram in 02/2019 which showed EF  60-65%, mild LVH, no significant valvular abnormalities, and normal LAE.   She presented to [facility] ED 07/03/19 with complaints of rapid palpitations with associated chest tightness and SOB. Initial EKG on arrival showed atrial fibrillation with RVR. She spontaneously converted to NSR. She was started on xarelto for stroke ppx and metoprolol succinate for rate control.   On follow up today she is doing well. She really hasn't noted any Afib. No chest pain or dyspnea. Has occasional dizzy spells that are limited. Notes HR is normal when this happens. Has some issues with arthritis. Enjoys doing yard work.   Past Medical History:  Diagnosis Date   Anxiety disorder    Arthritis    Depression    Flu    01/2017    GERD (gastroesophageal reflux disease)    Panic attack    panic attacks    RA (rheumatoid arthritis) ([place])    Urinary tract infection    hx of 02/2016    Vitamin D deficiency     Past Surgical History:  Procedure Laterality Date   ABDOMINAL HYSTERECTOMY     ANTERIOR AND POSTERIOR REPAIR WITH SACROSPINOUS FIXATION N/A 12/13/2014    Procedure: ANTERIOR AND POSTERIOR REPAIR WITH SACROSPINOUS LIGAMENT SUSPENSION;  Surgeon: Everlene Farrier, MD;  Location: [facility];  Service: Gynecology;  Laterality: N/A;   bladder tack surgery      BRONCHOSCOPY     COLONOSCOPY     TOTAL KNEE ARTHROPLASTY Left 04/07/2016   Procedure: LEFT  TOTAL KNEE ARTHROPLASTY;  Surgeon: Paralee Cancel, MD;  Location: [facility];  Service: Orthopedics;  Laterality: Left;     Current Outpatient Medications  Medication Sig Dispense Refill   Acetaminophen (TYLENOL ARTHRITIS EXT RELIEF PO) Take by mouth. Take 2 tablets twice a day     citalopram (CELEXA) 20 MG tablet Take 20 mg by mouth daily.  0   hydroxychloroquine (PLAQUENIL) 200 MG tablet TAKE 1 TABLET BY MOUTH EVERY DAY WITH FOOD OR MILK     metoprolol succinate (TOPROL-XL) 25 MG 24 hr tablet TAKE 1 TABLET BY MOUTH DAILY WITH OR IMMEDIATELY FOLLOWING A MEAL. MAY TAKE AN ADDITIONAL TABLET IF HEART RATE IS PERSISTENTLY GREATER THAN 120. 30 tablet 11   rivaroxaban (XARELTO) 20 MG TABS tablet Take 1 tablet (20 mg total) by mouth daily. 30 tablet 11   rosuvastatin (CRESTOR) 5 MG tablet Take 1 tablet (5 mg total) by mouth daily. 30 tablet 11   No current facility-administered medications for this visit.    Allergies:   Patient has no active allergies.    Social History:  The patient  reports that she has quit  smoking. She has never used smokeless tobacco. She reports current alcohol use. She reports that she does not use drugs.   Family History:  The patient's family history includes Breast cancer in her maternal aunt and sister; Melanoma in her niece; Other in her mother.    ROS:  Please see the history of present illness.   Otherwise, review of systems are positive for none.   All other systems are reviewed and negative.    PHYSICAL EXAM: VS:  BP 136/84    Ht 5\' 8"  (1.727 m)    Wt 156 lb 3.2 oz (70.9 kg)    BMI 23.75 kg/m  , BMI Body mass index is 23.75 kg/m. GEN: Well nourished, well developed, in no acute  distress HEENT: sclera anicteric Neck: no JVD, carotid bruits, or masses Cardiac: RRR; no murmurs, rubs, or gallops, no edema  Respiratory:  clear to auscultation bilaterally, normal work of breathing GI: soft, nontender, nondistended, + BS MS: no deformity or atrophy Skin: warm and dry, no rash Neuro:  Strength and sensation are intact Psych: euthymic mood, full affect   EKG:  EKG is  ordered today. NSR rate 65. RBBB. Chronic anterolateral T wave inversion. I have personally reviewed and interpreted this study.    Recent Labs: No results found for requested labs within last 8760 hours.    Lipid Panel    Component Value Date/Time   CHOL 209 (H) 10/05/2014 0217   TRIG 42 10/05/2014 0217   HDL 76 10/05/2014 0217   CHOLHDL 2.8 10/05/2014 0217   VLDL 8 10/05/2014 0217   LDLCALC 125 (H) 10/05/2014 0217   Dated 06/02/19: LDL 125. Triglycerides 41 Dated 07/23/20: cholesterol 171, triglycerides 70, HDL 80. nonHDL 91. LDL 77 Dated 02/26/21: BMET and CBC normal.  Wt Readings from Last 3 Encounters:  04/15/21 156 lb 3.2 oz (70.9 kg)  04/18/20 158 lb 9.6 oz (71.9 kg)  10/20/19 151 lb 6 oz (68.7 kg)      Other studies Reviewed: Additional studies/ records that were reviewed today include:    NST 04/2019: Study Highlights  Lexiscan stress EKG nondiagnostic due to baseline changes Myovue scan with normal perfusion No ischemia LVEF 64% This is a low risk study.   Echocardiogram 02/2019: Normal left ventricular size and systolic function with no appreciable segmental abnormality. Ejection fraction is visually estimated at 60-65% Mild concentric left ventricular hypertrophy No significant valvular abnormalities.  Echocardiogram 2016: Study Conclusions   - Left ventricle: The cavity size was normal. There was mild focal    basal and mild concentric hypertrophy of the septum. Systolic    function was normal. The estimated ejection fraction was in the    range of 55% to 60%.  Wall motion was normal; there were no    regional wall motion abnormalities. Left ventricular diastolic    function parameters were normal.  - Atrial septum: No defect or patent foramen ovale was identified.     ASSESSMENT AND PLAN:  1. Paroxysmal atrial fibrillation: mild rare symptoms.   prior stress test was non-ischemic.  Echo 02/2019 with normal EF, normal LA size, and no significant valvular abnormalities.  - Continue xarelto for stroke ppx - CHA2DS2-VASc Score of 3 (Vascular, Age 24-74, and Female) - Continue metoprolol succinate for rate control. We discussed taking an additional dose of metoprolol if HR is persistently >120.    2. Coronary artery calcifications on CT: NST 04/2019 was without ischemia. - Continue aggressive risk factor modifications  3. HLD: now  on low dose Crestor 5 mg daily. Tolerating well. This resulted in 50 point drop in LDL.   4. RBBB. New. This is a benign finding. Patient reassured.      Current medicines are reviewed at length with the patient today.  The patient does not have concerns regarding medicines.  The following changes have been made:  As above  Labs/ tests ordered today include:   No orders of the defined types were placed in this encounter.    Disposition:   FU with Dr. Martinique in one year  Signed, Vickey Ewbank Martinique, MD  04/15/2021 11:16 AM

## 2021-04-15 ENCOUNTER — Other Ambulatory Visit: Payer: Self-pay

## 2021-04-15 ENCOUNTER — Ambulatory Visit: Payer: Medicare Other | Admitting: Cardiology

## 2021-04-15 ENCOUNTER — Encounter: Payer: Self-pay | Admitting: Cardiology

## 2021-04-15 VITALS — BP 136/84 | Ht 68.0 in | Wt 156.2 lb

## 2021-04-15 DIAGNOSIS — E7800 Pure hypercholesterolemia, unspecified: Secondary | ICD-10-CM

## 2021-04-15 DIAGNOSIS — I2510 Atherosclerotic heart disease of native coronary artery without angina pectoris: Secondary | ICD-10-CM | POA: Diagnosis not present

## 2021-04-15 DIAGNOSIS — I4510 Unspecified right bundle-branch block: Secondary | ICD-10-CM | POA: Diagnosis not present

## 2021-04-15 DIAGNOSIS — I480 Paroxysmal atrial fibrillation: Secondary | ICD-10-CM | POA: Diagnosis not present

## 2021-04-15 DIAGNOSIS — I2584 Coronary atherosclerosis due to calcified coronary lesion: Secondary | ICD-10-CM

## 2021-05-13 ENCOUNTER — Other Ambulatory Visit: Payer: Self-pay | Admitting: Cardiology

## 2021-05-13 NOTE — Telephone Encounter (Signed)
Prescription refill request for Xarelto received.  ? ?Indication: afib  ?Last office visit: 04/15/2021, Martinique  ?Weight: 70.9 kg  ?Age: 76 yo  ?Scr: 0.8,  07/23/2020 ?CrCl: 67 ml/min  ? ?Refill sent.  ?

## 2021-06-18 ENCOUNTER — Other Ambulatory Visit: Payer: Self-pay

## 2021-06-18 MED ORDER — METOPROLOL SUCCINATE ER 25 MG PO TB24: ORAL_TABLET | ORAL | 3 refills | Status: DC

## 2021-06-23 ENCOUNTER — Other Ambulatory Visit: Payer: Self-pay | Admitting: Internal Medicine

## 2021-06-23 DIAGNOSIS — Z1231 Encounter for screening mammogram for malignant neoplasm of breast: Secondary | ICD-10-CM

## 2021-07-02 ENCOUNTER — Ambulatory Visit
Admission: RE | Admit: 2021-07-02 | Discharge: 2021-07-02 | Disposition: A | Discharge status: Home / Self Care | Payer: Medicare Other | Source: Ambulatory Visit | Attending: Internal Medicine | Admitting: Internal Medicine

## 2021-07-02 DIAGNOSIS — Z1231 Encounter for screening mammogram for malignant neoplasm of breast: Secondary | ICD-10-CM

## 2021-07-11 ENCOUNTER — Other Ambulatory Visit: Payer: Self-pay | Admitting: Cardiology

## 2021-07-11 NOTE — Telephone Encounter (Signed)
Pharmacy requesting ASAP due to patient leaving for vacation.

## 2021-07-20 DIAGNOSIS — M1711 Unilateral primary osteoarthritis, right knee: Secondary | ICD-10-CM | POA: Insufficient documentation

## 2021-10-10 ENCOUNTER — Other Ambulatory Visit: Payer: Self-pay

## 2021-10-10 ENCOUNTER — Encounter: Payer: Self-pay | Admitting: Cardiology

## 2021-10-10 DIAGNOSIS — I480 Paroxysmal atrial fibrillation: Secondary | ICD-10-CM

## 2021-10-10 NOTE — Telephone Encounter (Signed)
She could be a candidate. If she is interested in pursuing this would arrange visit with Dr Quentin Ore to discuss.  Danya Spearman Martinique MD, [facility]

## 2021-10-10 NOTE — Progress Notes (Signed)
Amb ref

## 2021-10-10 NOTE — Telephone Encounter (Signed)
Spoke to patient Dr.Jordan's advice given.EP scheduler will call back with appointment with Dr.Lambert.

## 2021-11-15 ENCOUNTER — Other Ambulatory Visit: Payer: Self-pay | Admitting: Cardiology

## 2021-11-15 DIAGNOSIS — I480 Paroxysmal atrial fibrillation: Secondary | ICD-10-CM

## 2021-11-17 NOTE — Telephone Encounter (Signed)
Prescription refill request for Xarelto received.  Indication: Afib  Last office visit: 04/15/21 (Martinique)  Weight: 70.9kg Age: 76 Scr: 0.73 (11/26/21 via [facility])  CrCl: 73.9m/min  Appropriate dose and refill sent to requested pharmacy.

## 2021-11-26 ENCOUNTER — Encounter: Payer: Self-pay | Admitting: Cardiology

## 2021-11-26 ENCOUNTER — Ambulatory Visit: Payer: Medicare Other | Attending: Cardiology | Admitting: Cardiology

## 2021-11-26 VITALS — BP 132/68 | HR 75 | Ht 68.0 in | Wt 161.0 lb

## 2021-11-26 DIAGNOSIS — I480 Paroxysmal atrial fibrillation: Secondary | ICD-10-CM

## 2021-11-26 DIAGNOSIS — E7800 Pure hypercholesterolemia, unspecified: Secondary | ICD-10-CM | POA: Insufficient documentation

## 2021-11-26 NOTE — Patient Instructions (Signed)
Medication Instructions:  None  *If you need a refill on your cardiac medications before your next appointment, please call your pharmacy*   Lab Work: none If you have labs (blood work) drawn today and your tests are completely normal, you will receive your results only by: [place] (if you have MyChart) OR A paper copy in the mail If you have any lab test that is abnormal or we need to change your treatment, we will call you to review the results.   Testing/Procedures: Your physician has requested that you have Left atrial appendage (LAA) closure device implantation is a procedure to put a small device in the LAA of the heart. The LAA is a small sac in the wall of the heart's left upper chamber. Blood clots can form in this area. The device, Watchman closes the LAA to help prevent a blood clot and stroke.    Follow-Up: At [facility], you and your health needs are our priority.  As part of our continuing mission to provide you with exceptional heart care, we have created designated Provider Care Teams.  These Care Teams include your primary Cardiologist (physician) and Advanced Practice Providers (APPs -  Physician Assistants and Nurse Practitioners) who all work together to provide you with the care you need, when you need it.  We recommend signing up for the patient portal called "MyChart".  Sign up information is provided on this After Visit Summary.  MyChart is used to connect with patients for Virtual Visits (Telemedicine).  Patients are able to view lab/test results, encounter notes, upcoming appointments, etc.  Non-urgent messages can be sent to your provider as well.   To learn more about what you can do with MyChart, go to [email or web address].    Your next appointment:   Please call the office Otila Kluver RN)  if you wish to move forward with Watchman device or Lenice Llamas, the [employer] Nurse Navigator direct number is [phone].   Important Information About  [place]

## 2021-11-26 NOTE — Progress Notes (Signed)
Electrophysiology Office Note:    Date:  11/26/2021   ID:  [name] [name], [name] [name] Jan 16, 1946, MRN [id number]  PCP:  Jani Gravel, MD  [facility] Cardiologist:  Peter Martinique, MD  [facility] Electrophysiologist:  Vickie Epley, MD   Referring MD: Jani Gravel, MD   Chief Complaint: AF  History of Present Illness:    [name] [name] is a 76 y.o. female who presents for an evaluation of AF at the request of Dr Martinique. Their medical history includes pAF, anxiety, RA, GERD and coronary artery calcium on CT.  The patient was last seen by Dr Martinique 04/15/2021. At that appointment she was doing OK. Low burden of AF symptoms but desired a stroke risk mitigation strategy that minimized exposure to anticoagulation.  Today,  [name] [name] has come in today to discuss the the watchman procedure.   She contracted COVID two years ago, and four months later, she began having afib episodes. After a few episodes, she came to [place] and was prescribed Xarelto. Her last episode of afib was around a year ago.   When she had her tooth pulled, she bled for a week afterwards. She went into the ER and had a pad sewn in, which stopped the bleeding.   She has had a right knee replacement, and is considering a right shoulder replacement. She often gets cortisone injections in her knees and shoulders. Her last cortisone shot was in August.   She does not feel palpitations when she has episodes of Afib.   She denies any chest pain, shortness of breath, or peripheral edema. No lightheadedness, headaches, syncope, orthopnea, or PND.      Past Medical History:  Diagnosis Date   Anxiety disorder    Arthritis    Depression    Flu    01/2017    GERD (gastroesophageal reflux disease)    Panic attack    panic attacks    RA (rheumatoid arthritis) ([place])    Urinary tract infection    hx of 02/2016    Vitamin D deficiency     Past Surgical History:  Procedure Laterality Date    ABDOMINAL HYSTERECTOMY     ANTERIOR AND POSTERIOR REPAIR WITH SACROSPINOUS FIXATION N/A 12/13/2014   Procedure: ANTERIOR AND POSTERIOR REPAIR WITH SACROSPINOUS LIGAMENT SUSPENSION;  Surgeon: Everlene Farrier, MD;  Location: [place] [facility];  Service: Gynecology;  Laterality: N/A;   bladder tack surgery      BRONCHOSCOPY     COLONOSCOPY     TOTAL KNEE ARTHROPLASTY Left 04/07/2016   Procedure: LEFT  TOTAL KNEE ARTHROPLASTY;  Surgeon: Paralee Cancel, MD;  Location: [facility];  Service: Orthopedics;  Laterality: Left;    Current Medications: Current Meds  Medication Sig   Acetaminophen (TYLENOL ARTHRITIS EXT RELIEF PO) Take by mouth. Take 2 tablets twice a day   citalopram (CELEXA) 20 MG tablet Take 20 mg by mouth daily.   hydroxychloroquine (PLAQUENIL) 200 MG tablet TAKE 1 TABLET BY MOUTH EVERY DAY WITH FOOD OR MILK   metoprolol succinate (TOPROL-XL) 25 MG 24 hr tablet TAKE 1 TABLET BY MOUTH DAILY WITH OR IMMEDIATELY FOLLOWING A MEAL. MAY TAKE AN ADDITIONAL TABLET IF HEART RATE IS PERSISTENTLY GREATER THAN 120.   rivaroxaban (XARELTO) 20 MG TABS tablet Take 1 tablet by mouth once daily   rosuvastatin (CRESTOR) 5 MG tablet Take 1 tablet by mouth once daily     Allergies:   Patient has no active allergies.   Social History   Socioeconomic History  Marital status: Married    Spouse name: Not on file   Number of children: Not on file   Years of education: Not on file   Highest education level: Not on file  Occupational History   Not on file  Tobacco Use   Smoking status: Former   Smokeless tobacco: Never  Vaping Use   Vaping Use: Never used  Substance and Sexual Activity   Alcohol use: Yes    Comment: 2-3 glasses of wine some days "I probably drink  more than I should"   Drug use: No   Sexual activity: Yes    Birth control/protection: Post-menopausal  Other Topics Concern   Not on file  Social History Narrative   Not on file   Social Determinants of Health   Financial Resource Strain: Not  on file  Food Insecurity: Not on file  Transportation Needs: Not on file  Physical Activity: Not on file  Stress: Not on file  Social Connections: Not on file     Family History: The patient's family history includes Breast cancer in her maternal aunt and sister; Melanoma in her niece; Other in her mother.  ROS:   Please see the history of present illness.     (+)Afib (+)Shoulder Pain  All other systems reviewed and are negative.  EKGs/Labs/Other Studies Reviewed:    The following studies were reviewed today:  10/11/2021 Echo EF 55-60  04/15/2021 ECG shows sinus with RBBB    Recent Labs: No results found for requested labs within last 365 days.  Recent Lipid Panel    Component Value Date/Time   CHOL 209 (H) 10/05/2014 0217   TRIG 42 10/05/2014 0217   HDL 76 10/05/2014 0217   CHOLHDL 2.8 10/05/2014 0217   VLDL 8 10/05/2014 0217   LDLCALC 125 (H) 10/05/2014 0217    Physical Exam:    VS:  BP 132/68   Pulse 75   Ht '5\' 8"'$  (1.727 m)   Wt 161 lb (73 kg)   SpO2 94%   BMI 24.48 kg/m     Wt Readings from Last 3 Encounters:  11/26/21 161 lb (73 kg)  04/15/21 156 lb 3.2 oz (70.9 kg)  04/18/20 158 lb 9.6 oz (71.9 kg)     GEN:  Well nourished, well developed in no acute distress HEENT: Normal NECK: No JVD; No carotid bruits LYMPHATICS: No lymphadenopathy CARDIAC: RRR, no murmurs, rubs, gallops RESPIRATORY:  Clear to auscultation without rales, wheezing or rhonchi  ABDOMEN: Soft, non-tender, non-distended MUSCULOSKELETAL:  No edema; No deformity  SKIN: Warm and dry NEUROLOGIC:  Alert and oriented x 3 PSYCHIATRIC:  Normal affect       ASSESSMENT:    1. Paroxysmal atrial fibrillation (HCC)    PLAN:    In order of problems listed above:  #pAF  Low burden of AF.    -----------------  I have seen [name] [name] in the office today who is being considered for a Watchman left atrial appendage closure device. I believe they will benefit from this  procedure given their history of atrial fibrillation, CHA2DS2-VASc score of 4 and unadjusted ischemic stroke rate of 4.8% per year. The patient's chart has been reviewed and I feel that they would be a candidate for short term oral anticoagulation after Watchman implant.   It is my belief that after undergoing a LAA closure procedure, [name] [name] will not need long term anticoagulation which eliminates anticoagulation side effects and major bleeding risk.   Procedural risks for  the Watchman implant have been reviewed with the patient including a 0.5% risk of stroke, <1% risk of perforation and <1% risk of device embolization. Other risks include bleeding, vascular damage, tamponade, worsening renal function, and death. The patient understands these risks   The published clinical data on the safety and effectiveness of WATCHMAN include but are not limited to the following: - Holmes DR, Mechele Claude, Sick P et al. for the PROTECT AF Investigators. Percutaneous closure of the left atrial appendage versus warfarin therapy for prevention of stroke in patients with atrial fibrillation: a randomised non-inferiority trial. Lancet 2009; 374: 534-42. Mechele Claude, Doshi SK, Abelardo Diesel D et al. on behalf of the PROTECT AF Investigators. Percutaneous Left Atrial Appendage Closure for Stroke Prophylaxis in Patients With Atrial Fibrillation 2.3-Year Follow-up of the PROTECT AF (Watchman Left Atrial Appendage System for Embolic Protection in Patients With Atrial Fibrillation) Trial. Circulation 2013; 127:720-729. - Alli O, Doshi S,  Kar S, Reddy VY, Sievert H et al. Quality of Life Assessment in the Randomized PROTECT AF (Percutaneous Closure of the Left Atrial Appendage Versus Warfarin Therapy for Prevention of Stroke in Patients With Atrial Fibrillation) Trial of Patients at Risk for Stroke With Nonvalvular Atrial Fibrillation. J Am Coll Cardiol 2013; 54:0981-1. Vertell Limber DR, Tarri Abernethy, Price M, [place],  Sievert H, Doshi S, Huber K, Reddy V. Prospective randomized evaluation of the Watchman left atrial appendage Device in patients with atrial fibrillation versus long-term warfarin therapy; the PREVAIL trial. Journal of the [employer] of Cardiology, Vol. 4, No. 1, 2014, 1-11. - Kar S, Doshi SK, Sadhu A, Horton R, Osorio J et al. Primary outcome evaluation of a next-generation left atrial appendage closure device: results from the PINNACLE FLX trial. Circulation 2021;143(18)1754-1762.    After today's visit with the patient which was dedicated solely for shared decision making visit regarding LAA closure device, the patient decided to proceed with the LAA appendage closure procedure scheduled to be done in the near future at [facility]. Prior to the procedure, I would like to obtain a gated CT scan of the chest with contrast timed for PV/LA visualization.   Additionally, the patient will need a TTE.  HAS-BLED score 1 Hypertension No  Abnormal renal and liver function (Dialysis, transplant, Cr >2.26 mg/dL /Cirrhosis or Bilirubin >2x Normal or AST/ALT/AP >3x Normal) No  Stroke No  Bleeding  No Labile INR (Unstable/high INR) No  Elderly (>65) Yes  Drugs or alcohol (? 8 drinks/week, anti-plt or NSAID) No   CHA2DS2-VASc Score = 4  The patient's score is based upon: CHF History: 0 HTN History: 0 Diabetes History: 0 Stroke History: 0 Vascular Disease History: 1 Age Score: 2 Gender Score: 1    Medication Adjustments/Labs and Tests Ordered: Current medicines are reviewed at length with the patient today.  Concerns regarding medicines are outlined above.  No orders of the defined types were placed in this encounter.  No orders of the defined types were placed in this encounter.   I,Mary Mosetta Pigeon Buren,acting as a scribe for Vickie Epley, MD.,have documented all relevant documentation on the behalf of Vickie Epley, MD,as directed by  Vickie Epley, MD while  in the presence of Vickie Epley, MD.   I, Vickie Epley, MD, have reviewed all documentation for this visit. The documentation on 11/26/21 for the exam, diagnosis, procedures, and orders are all accurate and complete.    Signed, Hilton Cork. Quentin Ore, MD, [facility], [facility] 11/26/2021  3:15 PM    Electrophysiology [facility]

## 2021-11-26 NOTE — Progress Notes (Deleted)
Electrophysiology Office Note:    Date:  11/26/2021   ID:  [name], [name] 11-Aug-1945, MRN [id number]  PCP:  Jani Gravel, MD  [facility] Cardiologist:  Peter Martinique, MD  [facility] Electrophysiologist:  Vickie Epley, MD   Referring MD: Jani Gravel, MD   Chief Complaint: AF  History of Present Illness:    [name] [name] is a 76 y.o. female who presents for an evaluation of AF at the request of Dr Martinique. Their medical history includes pAF, anxiety, RA, GERD and coronary artery calcium on CT.  The patient was last seen by Dr Martinique 04/15/2021. At that appointment she was doing OK. Low burden of AF symptoms but desired a stroke risk mitigation strategy that minimized exposure to anticoagulation.     Past Medical History:  Diagnosis Date   Anxiety disorder    Arthritis    Depression    Flu    01/2017    GERD (gastroesophageal reflux disease)    Panic attack    panic attacks    RA (rheumatoid arthritis) ([place])    Urinary tract infection    hx of 02/2016    Vitamin D deficiency     Past Surgical History:  Procedure Laterality Date   ABDOMINAL HYSTERECTOMY     ANTERIOR AND POSTERIOR REPAIR WITH SACROSPINOUS FIXATION N/A 12/13/2014   Procedure: ANTERIOR AND POSTERIOR REPAIR WITH SACROSPINOUS LIGAMENT SUSPENSION;  Surgeon: Everlene Farrier, MD;  Location: [place] [facility];  Service: Gynecology;  Laterality: N/A;   bladder tack surgery      BRONCHOSCOPY     COLONOSCOPY     TOTAL KNEE ARTHROPLASTY Left 04/07/2016   Procedure: LEFT  TOTAL KNEE ARTHROPLASTY;  Surgeon: Paralee Cancel, MD;  Location: [facility];  Service: Orthopedics;  Laterality: Left;    Current Medications: No outpatient medications have been marked as taking for the 11/26/21 encounter (Office Visit) with Vickie Epley, MD.     Allergies:   Patient has no active allergies.   Social History   Socioeconomic History   Marital status: Married    Spouse name: Not on file   Number of children:  Not on file   Years of education: Not on file   Highest education level: Not on file  Occupational History   Not on file  Tobacco Use   Smoking status: Former   Smokeless tobacco: Never  Vaping Use   Vaping Use: Never used  Substance and Sexual Activity   Alcohol use: Yes    Comment: 2-3 glasses of wine some days "I probably drink  more than I should"   Drug use: No   Sexual activity: Yes    Birth control/protection: Post-menopausal  Other Topics Concern   Not on file  Social History Narrative   Not on file   Social Determinants of Health   Financial Resource Strain: Not on file  Food Insecurity: Not on file  Transportation Needs: Not on file  Physical Activity: Not on file  Stress: Not on file  Social Connections: Not on file     Family History: The patient's family history includes Breast cancer in her maternal aunt and sister; Melanoma in her niece; Other in her mother.  ROS:   Please see the history of present illness.    All other systems reviewed and are negative.  EKGs/Labs/Other Studies Reviewed:    The following studies were reviewed today:  10/11/2021 Echo EF 55-60  04/15/2021 ECG shows sinus with RBBB    Recent  Labs: No results found for requested labs within last 365 days.  Recent Lipid Panel    Component Value Date/Time   CHOL 209 (H) 10/05/2014 0217   TRIG 42 10/05/2014 0217   HDL 76 10/05/2014 0217   CHOLHDL 2.8 10/05/2014 0217   VLDL 8 10/05/2014 0217   LDLCALC 125 (H) 10/05/2014 0217    Physical Exam:    VS:  There were no vitals taken for this visit.    Wt Readings from Last 3 Encounters:  04/15/21 156 lb 3.2 oz (70.9 kg)  04/18/20 158 lb 9.6 oz (71.9 kg)  10/20/19 151 lb 6 oz (68.7 kg)     GEN: *** Well nourished, well developed in no acute distress HEENT: Normal NECK: No JVD; No carotid bruits LYMPHATICS: No lymphadenopathy CARDIAC: ***RRR, no murmurs, rubs, gallops RESPIRATORY:  Clear to auscultation without rales,  wheezing or rhonchi  ABDOMEN: Soft, non-tender, non-distended MUSCULOSKELETAL:  No edema; No deformity  SKIN: Warm and dry NEUROLOGIC:  Alert and oriented x 3 PSYCHIATRIC:  Normal affect       ASSESSMENT:    1. Paroxysmal atrial fibrillation (HCC)    PLAN:    In order of problems listed above:  #pAF  ***AF burden***   -----------------  I have seen Orlena Sheldon in the office today who is being considered for a Watchman left atrial appendage closure device. I believe they will benefit from this procedure given their history of atrial fibrillation, CHA2DS2-VASc score of 4 and unadjusted ischemic stroke rate of 4.8% per year. The patient's chart has been reviewed and I feel that they would be a candidate for short term oral anticoagulation after Watchman implant.   It is my belief that after undergoing a LAA closure procedure, Camala Talwar will not need long term anticoagulation which eliminates anticoagulation side effects and major bleeding risk.   Procedural risks for the Watchman implant have been reviewed with the patient including a 0.5% risk of stroke, <1% risk of perforation and <1% risk of device embolization. Other risks include bleeding, vascular damage, tamponade, worsening renal function, and death. The patient understands these risk and wishes to proceed.     The published clinical data on the safety and effectiveness of WATCHMAN include but are not limited to the following: - Holmes DR, Mechele Claude, Sick P et al. for the PROTECT AF Investigators. Percutaneous closure of the left atrial appendage versus warfarin therapy for prevention of stroke in patients with atrial fibrillation: a randomised non-inferiority trial. Lancet 2009; 374: 534-42. Mechele Claude, Doshi SK, Abelardo Diesel D et al. on behalf of the PROTECT AF Investigators. Percutaneous Left Atrial Appendage Closure for Stroke Prophylaxis in Patients With Atrial Fibrillation 2.3-Year Follow-up of  the PROTECT AF (Watchman Left Atrial Appendage System for Embolic Protection in Patients With Atrial Fibrillation) Trial. Circulation 2013; 127:720-729. - Alli O, Doshi S,  Kar S, Reddy VY, Sievert H et al. Quality of Life Assessment in the Randomized PROTECT AF (Percutaneous Closure of the Left Atrial Appendage Versus Warfarin Therapy for Prevention of Stroke in Patients With Atrial Fibrillation) Trial of Patients at Risk for Stroke With Nonvalvular Atrial Fibrillation. J Am Coll Cardiol 2013; 75:1700-1. - [place], Tarri Abernethy, Price M, Whisenant B, Sievert H, Doshi S, Huber K, Reddy V. Prospective randomized evaluation of the Watchman left atrial appendage Device in patients with atrial fibrillation versus long-term warfarin therapy; the PREVAIL trial. Journal of the [employer] of Cardiology, Vol. 4, No. 1, 2014, 1-11. -  Tarri Abernethy, Doshi SK, Sadhu A, Horton R, Osorio J et al. Primary outcome evaluation of a next-generation left atrial appendage closure device: results from the PINNACLE FLX trial. Circulation 2021;143(18)1754-1762.    After today's visit with the patient which was dedicated solely for shared decision making visit regarding LAA closure device, the patient decided to proceed with the LAA appendage closure procedure scheduled to be done in the near future at [facility]. Prior to the procedure, I would like to obtain a gated CT scan of the chest with contrast timed for PV/LA visualization.   Additionally, the patient will need a TTE.  HAS-BLED score 1 Hypertension No  Abnormal renal and liver function (Dialysis, transplant, Cr >2.26 mg/dL /Cirrhosis or Bilirubin >2x Normal or AST/ALT/AP >3x Normal) No  Stroke No  Bleeding *** Labile INR (Unstable/high INR) No  Elderly (>65) Yes  Drugs or alcohol (? 8 drinks/week, anti-plt or NSAID) No   CHA2DS2-VASc Score = 4  The patient's score is based upon: CHF History: 0 HTN History: 0 Diabetes History: 0 Stroke History: 0 Vascular  Disease History: 1 Age Score: 2 Gender Score: 1      Medication Adjustments/Labs and Tests Ordered: Current medicines are reviewed at length with the patient today.  Concerns regarding medicines are outlined above.  No orders of the defined types were placed in this encounter.  No orders of the defined types were placed in this encounter.    Signed, Hilton Cork. Quentin Ore, MD, [facility], [facility] 11/26/2021 5:35 AM    Electrophysiology [facility]

## 2021-11-27 ENCOUNTER — Telehealth: Payer: Self-pay

## 2021-11-27 DIAGNOSIS — I480 Paroxysmal atrial fibrillation: Secondary | ICD-10-CM

## 2021-11-27 NOTE — Telephone Encounter (Signed)
Called patient to discuss Watchman. She wishes to wait until late next spring (around May) to have it done so she can get past tax season.  Scheduled the patient for her yearly visit with Dr. Martinique 03/30/2022. She understands she will need this visit for insurance coverage for the Watchman procedure.  Scheduled her for echo 2/1.  She understands she will be called when the April/May/June procedure schedule is out to arrange pre-LAAO CT and tentative Watchman procedure.  She understands she will have labs drawn and receive CT instructions at visit with Dr. Martinique.  She was grateful for call and agrees with plan.

## 2022-01-20 ENCOUNTER — Other Ambulatory Visit: Payer: Self-pay | Admitting: Cardiology

## 2022-02-03 DIAGNOSIS — I480 Paroxysmal atrial fibrillation: Secondary | ICD-10-CM

## 2022-03-19 ENCOUNTER — Ambulatory Visit (HOSPITAL_COMMUNITY): Payer: Medicare Other | Attending: Cardiology

## 2022-03-19 DIAGNOSIS — I480 Paroxysmal atrial fibrillation: Secondary | ICD-10-CM

## 2022-03-19 LAB — ECHOCARDIOGRAM COMPLETE
Area-P 1/2: 3.21 cm2
S' Lateral: 2.7 cm

## 2022-03-26 NOTE — Progress Notes (Signed)
Cardiology Office Note   Date:  04/15/2021   ID:  [name] [name], [name] [name] 05/01/1945, MRN [id number]  PCP:  Jani Gravel, MD  Cardiologist:  Shanard Treto Martinique, MD EP: None  Chief Complaint  Patient presents with   Atrial Fibrillation      History of Present Illness: [name] [name] is a 77 y.o. female with PMH of paroxysmal atrial fibrillation, coronary artery calcifications on CT, anxiety/panic attacks, RA, and GERD, who is seen for follow-up.   She was last evaluated  04/13/19 to evaluate coronary artery calcifications seen on CT Chest during a recent admission for COVID-19 PNA 02/2019. She continued to have SOB following her COVID infection. She was recommended to undergo a NST which showed EF 64% and no evidence of ischemia. Aggressive risk factor modifications were encouraged. Her last echocardiogram in 02/2019 which showed EF  60-65%, mild LVH, no significant valvular abnormalities, and normal LAE.   She presented to [facility] ED 07/03/19 with complaints of rapid palpitations with associated chest tightness and SOB. Initial EKG on arrival showed atrial fibrillation with RVR. She spontaneously converted to NSR. She was started on xarelto for stroke ppx and metoprolol succinate for rate control.   She does have a low Afib burden. Was interested in consideration of Watchman LA occlusion device and was referred to Dr Quentin Ore. It was felt that she was a good candidate. Evaluation in Progress including Echo noted below. CT planned to assess anatomy.  On follow up today she is doing well. She really hasn't noted any Afib. No chest pain or dyspnea. She does have some orthopedic issues and thinks she will need knee and shoulder surgery. No bleeding    Past Medical History:  Diagnosis Date   Anxiety disorder    Arthritis    Depression    Flu    01/2017    GERD (gastroesophageal reflux disease)    Panic attack    panic attacks    RA (rheumatoid arthritis) ([place])    Urinary tract  infection    hx of 02/2016    Vitamin D deficiency     Past Surgical History:  Procedure Laterality Date   ABDOMINAL HYSTERECTOMY     ANTERIOR AND POSTERIOR REPAIR WITH SACROSPINOUS FIXATION N/A 12/13/2014   Procedure: ANTERIOR AND POSTERIOR REPAIR WITH SACROSPINOUS LIGAMENT SUSPENSION;  Surgeon: Everlene Farrier, MD;  Location: [facility];  Service: Gynecology;  Laterality: N/A;   bladder tack surgery      BRONCHOSCOPY     COLONOSCOPY     TOTAL KNEE ARTHROPLASTY Left 04/07/2016   Procedure: LEFT  TOTAL KNEE ARTHROPLASTY;  Surgeon: Paralee Cancel, MD;  Location: [facility];  Service: Orthopedics;  Laterality: Left;     Current Outpatient Medications  Medication Sig Dispense Refill   Acetaminophen (TYLENOL ARTHRITIS EXT RELIEF PO) Take by mouth. Take 2 tablets twice a day     citalopram (CELEXA) 20 MG tablet Take 20 mg by mouth daily.  0   hydroxychloroquine (PLAQUENIL) 200 MG tablet TAKE 1 TABLET BY MOUTH EVERY DAY WITH FOOD OR MILK     metoprolol succinate (TOPROL-XL) 25 MG 24 hr tablet TAKE 1 TABLET BY MOUTH DAILY WITH OR IMMEDIATELY FOLLOWING A MEAL. MAY TAKE AN ADDITIONAL TABLET IF HEART RATE IS PERSISTENTLY GREATER THAN 120. 30 tablet 11   rivaroxaban (XARELTO) 20 MG TABS tablet Take 1 tablet (20 mg total) by mouth daily. 30 tablet 11   rosuvastatin (CRESTOR) 5 MG tablet Take 1 tablet (5 mg total) by  mouth daily. 30 tablet 11   No current facility-administered medications for this visit.    Allergies:   Patient has no active allergies.    Social History:  The patient  reports that she has quit smoking. She has never used smokeless tobacco. She reports current alcohol use. She reports that she does not use drugs.   Family History:  The patient's family history includes Breast cancer in her maternal aunt and sister; Melanoma in her niece; Other in her mother.    ROS:  Please see the history of present illness.   Otherwise, review of systems are positive for none.   All other systems are  reviewed and negative.    PHYSICAL EXAM: VS:  BP 136/84   Ht 5' 8"$  (1.727 m)   Wt 156 lb 3.2 oz (70.9 kg)   BMI 23.75 kg/m  , BMI Body mass index is 23.75 kg/m. GEN: Well nourished, well developed, in no acute distress HEENT: sclera anicteric Neck: no JVD, carotid bruits, or masses Cardiac: RRR; no murmurs, rubs, or gallops, no edema  Respiratory:  clear to auscultation bilaterally, normal work of breathing GI: soft, nontender, nondistended, + BS MS: no deformity or atrophy Skin: warm and dry, no rash Neuro:  Strength and sensation are intact Psych: euthymic mood, full affect   EKG:  EKG is  ordered today. NSR rate 75. RBBB old. Chronic anterolateral T wave inversion. I have personally reviewed and interpreted this study.    Recent Labs: No results found for requested labs within last 8760 hours.    Lipid Panel    Component Value Date/Time   CHOL 209 (H) 10/05/2014 0217   TRIG 42 10/05/2014 0217   HDL 76 10/05/2014 0217   CHOLHDL 2.8 10/05/2014 0217   VLDL 8 10/05/2014 0217   LDLCALC 125 (H) 10/05/2014 0217   Dated 06/02/19: LDL 125. Triglycerides 41 Dated 07/23/20: cholesterol 171, triglycerides 70, HDL 80. nonHDL 91. LDL 77 Dated 02/26/21: BMET and CBC normal.  Wt Readings from Last 3 Encounters:  04/15/21 156 lb 3.2 oz (70.9 kg)  04/18/20 158 lb 9.6 oz (71.9 kg)  10/20/19 151 lb 6 oz (68.7 kg)      Other studies Reviewed: Additional studies/ records that were reviewed today include:    NST 04/2019: Study Highlights  Lexiscan stress EKG nondiagnostic due to baseline changes Myovue scan with normal perfusion No ischemia LVEF 64% This is a low risk study.   Echocardiogram 02/2019: Normal left ventricular size and systolic function with no appreciable segmental abnormality. Ejection fraction is visually estimated at 60-65% Mild concentric left ventricular hypertrophy No significant valvular abnormalities.  Echocardiogram 2016: Study Conclusions   -  Left ventricle: The cavity size was normal. There was mild focal    basal and mild concentric hypertrophy of the septum. Systolic    function was normal. The estimated ejection fraction was in the    range of 55% to 60%. Wall motion was normal; there were no    regional wall motion abnormalities. Left ventricular diastolic    function parameters were normal.  - Atrial septum: No defect or patent foramen ovale was identified.    Echo 03/19/22: IMPRESSIONS     1. Left ventricular ejection fraction, by estimation, is 65 to 70%. The  left ventricle has normal function. The left ventricle has no regional  wall motion abnormalities. There is moderate hypertrophy of the basal  septal segment. The rest of the LV  segments demonstrate mild left ventricular hypertrophy.  Left ventricular  diastolic parameters are consistent with Grade I diastolic dysfunction  (impaired relaxation).   2. Right ventricular systolic function is normal. The right ventricular  size is normal.   3. The mitral valve is normal in structure. Trivial mitral valve  regurgitation.   4. The aortic valve is tricuspid. Aortic valve regurgitation is not  visualized. No aortic stenosis is present.   5. The inferior vena cava is normal in size with greater than 50%  respiratory variability, suggesting right atrial pressure of 3 mmHg.    ASSESSMENT AND PLAN:  1. Paroxysmal atrial fibrillation: mild rare symptoms.   prior stress test was non-ischemic.  Echo looks good.   - Continue xarelto for stroke ppx - CHA2DS2-VASc Score of 3 (Vascular, Age 24-74, and Female) - Continue metoprolol succinate for rate control. We discussed taking an additional dose of metoprolol if HR is persistently >120.  I have seen [name] [name] in the office today.  She was referred by Jani Gravel, MD for consideration for Left Atrial Appendage Closure with the Watchman Device for management of stroke risk. Based upon past history, it has been  determined that she is a poor candidate for long-term oral anticoagulation.  However, she may be tolerant of short-term treatment with an anticoagulant as necessary.  A shared decision has been made utilizing the [employer] of Cardiology shared decision tool to undergo Left Atrial Appendage Closure with Watchman device at this time.   2. Coronary artery calcifications on CT: NST 04/2019 was without ischemia. - Continue aggressive risk factor modifications  3. HLD: now on low dose Crestor 5 mg daily. Tolerating well. This resulted in 50 point drop in LDL.   4. RBBB. Chronic      Current medicines are reviewed at length with the patient today.  The patient does not have concerns regarding medicines.  The following changes have been made:  As above  Labs/ tests ordered today include:   No orders of the defined types were placed in this encounter.    Disposition:   FU with Dr. Martinique in one year  Signed, Alandis Bluemel Martinique, MD  04/15/2021 11:16 AM

## 2022-03-30 ENCOUNTER — Encounter: Payer: Self-pay | Admitting: Cardiology

## 2022-03-30 ENCOUNTER — Telehealth: Payer: Self-pay | Admitting: Emergency Medicine

## 2022-03-30 ENCOUNTER — Ambulatory Visit: Payer: Medicare Other | Attending: Cardiology | Admitting: Cardiology

## 2022-03-30 VITALS — BP 130/62 | HR 75 | Ht 68.0 in | Wt 155.2 lb

## 2022-03-30 DIAGNOSIS — I480 Paroxysmal atrial fibrillation: Secondary | ICD-10-CM

## 2022-03-30 DIAGNOSIS — E7800 Pure hypercholesterolemia, unspecified: Secondary | ICD-10-CM

## 2022-03-30 DIAGNOSIS — I4510 Unspecified right bundle-branch block: Secondary | ICD-10-CM

## 2022-03-30 NOTE — Patient Instructions (Signed)
Medication Instructions:  No changes *If you need a refill on your cardiac medications before your next appointment, please call your pharmacy*  Follow-Up: At [facility], you and your health needs are our priority.  As part of our continuing mission to provide you with exceptional heart care, we have created designated Provider Care Teams.  These Care Teams include your primary Cardiologist (physician) and Advanced Practice Providers (APPs -  Physician Assistants and Nurse Practitioners) who all work together to provide you with the care you need, when you need it.  We recommend signing up for the patient portal called "MyChart".  Sign up information is provided on this After Visit Summary.  MyChart is used to connect with patients for Virtual Visits (Telemedicine).  Patients are able to view lab/test results, encounter notes, upcoming appointments, etc.  Non-urgent messages can be sent to your provider as well.   To learn more about what you can do with MyChart, go to [email or web address].    Your next appointment:   1 year(s)  Provider:   Peter Martinique, MD

## 2022-03-30 NOTE — Telephone Encounter (Signed)
Spoke with patient and told that she needs some blood drawn within a week- this is for her CT on 04/09/22. Patient plans to come later in the week to get BMET here in office. Given hours- 8-4, they take lunch 1-2.  Patient verbalized understanding.

## 2022-04-02 NOTE — Addendum Note (Signed)
Addended by: Hinton Dyer on: 04/02/2022 10:05 AM   Modules accepted: Orders

## 2022-04-06 ENCOUNTER — Other Ambulatory Visit: Payer: Self-pay

## 2022-04-06 DIAGNOSIS — I480 Paroxysmal atrial fibrillation: Secondary | ICD-10-CM

## 2022-04-07 LAB — BASIC METABOLIC PANEL
BUN/Creatinine Ratio: 21 (ref 12–28)
BUN: 13 mg/dL (ref 8–27)
CO2: 22 mmol/L (ref 20–29)
Calcium: 10.1 mg/dL (ref 8.7–10.3)
Chloride: 102 mmol/L (ref 96–106)
Creatinine, Ser: 0.62 mg/dL (ref 0.57–1.00)
Glucose: 86 mg/dL (ref 70–99)
Potassium: 5.2 mmol/L (ref 3.5–5.2)
Sodium: 141 mmol/L (ref 134–144)
eGFR: 92 mL/min/{1.73_m2} (ref >59)

## 2022-04-08 ENCOUNTER — Telehealth (HOSPITAL_COMMUNITY): Payer: Self-pay | Admitting: Emergency Medicine

## 2022-04-08 NOTE — Telephone Encounter (Signed)
Attempted to call patient regarding upcoming cardiac CT appointment. °Left message on voicemail with name and callback number °Tameron Lama RN Navigator Cardiac Imaging °[facility] °[phone] Office °[phone] Cell ° °

## 2022-04-09 ENCOUNTER — Ambulatory Visit (HOSPITAL_COMMUNITY)
Admission: RE | Admit: 2022-04-09 | Discharge: 2022-04-09 | Disposition: A | Discharge status: Home / Self Care | Payer: Medicare Other | Source: Ambulatory Visit | Attending: Cardiology | Admitting: Cardiology

## 2022-04-09 ENCOUNTER — Telehealth (HOSPITAL_COMMUNITY): Payer: Self-pay | Admitting: Emergency Medicine

## 2022-04-09 DIAGNOSIS — I480 Paroxysmal atrial fibrillation: Secondary | ICD-10-CM | POA: Insufficient documentation

## 2022-04-09 MED ORDER — IOHEXOL 350 MG/ML SOLN
100.0000 mL | Freq: Once | INTRAVENOUS | Status: AC | PRN
  Administered 2022-04-09: 100 mL via INTRAVENOUS

## 2022-04-09 NOTE — Telephone Encounter (Signed)
Reaching out to patient to offer assistance regarding upcoming cardiac imaging study; pt verbalizes understanding of appt date/time, parking situation and where to check in, pre-test NPO status and medications ordered, and verified current allergies; name and call back number provided for further questions should they arise Marchia Bond RN Navigator Cardiac Imaging [facility] [phone] office [phone] cell  Arrival [address]

## 2022-04-13 ENCOUNTER — Other Ambulatory Visit: Payer: Self-pay

## 2022-04-13 DIAGNOSIS — I480 Paroxysmal atrial fibrillation: Secondary | ICD-10-CM

## 2022-05-08 ENCOUNTER — Encounter: Payer: Self-pay | Admitting: Cardiology

## 2022-05-17 ENCOUNTER — Encounter: Payer: Self-pay | Admitting: Cardiology

## 2022-05-23 ENCOUNTER — Other Ambulatory Visit: Payer: Self-pay | Admitting: Cardiology

## 2022-05-23 DIAGNOSIS — I480 Paroxysmal atrial fibrillation: Secondary | ICD-10-CM

## 2022-05-25 ENCOUNTER — Encounter: Payer: Self-pay | Admitting: Cardiology

## 2022-05-25 NOTE — Telephone Encounter (Signed)
Prescription refill request for Xarelto received.  Indication: Afib  Last office visit: 03/30/22 ([place])  Weight: 70.4kg Age: 77 Scr: 0.65 (04/06/22)  CrCl: 80.79ml/min  Appropriate dose. Refill sent.

## 2022-06-12 ENCOUNTER — Ambulatory Visit: Payer: Medicare Other | Attending: Cardiology | Admitting: Cardiology

## 2022-06-12 VITALS — BP 152/84 | HR 71 | Ht 68.0 in | Wt 155.4 lb

## 2022-06-12 DIAGNOSIS — Z01812 Encounter for preprocedural laboratory examination: Secondary | ICD-10-CM

## 2022-06-12 DIAGNOSIS — I480 Paroxysmal atrial fibrillation: Secondary | ICD-10-CM | POA: Diagnosis not present

## 2022-06-12 NOTE — Progress Notes (Signed)
[facility]                                     Cardiology Office Note:    Date:  06/12/2022   ID:  [name] [name], DOB 01/10/1946, MRN [id number]  PCP:  Georgianne Fick, MD  [facility] Cardiologist:  Peter [place], MD  [facility] Electrophysiologist:  Lanier Prude, MD   Referring MD: Georgianne Fick, MD   Chief Complaint  Patient presents with   Follow-up    Pre LAAO    History of Present Illness:    [name] [name] is a 77 y.o. female with a hx of PAF, anxiety, RA, GERD, and elevated coronary calcium score on CT.   [name] [name] is followed by Dr. [place] for her cardiology care. She has a low AF burden however she was interested in stroke risk mitigation to limited exposure to anticoagulation given the need to medication with NSAIDs for arthritic pain.   In consultation she was felt to be a good candidate to proceed with LAAO closure scheduled for 06/25/22.   Today she is here alone and reports she has been well with no palpitations, dizziness, bleeding in stool or urine, CP, or SOB.   Pre Watchman CT with anatomy suitable to proceed.   Past Medical History:  Diagnosis Date   Anxiety disorder    Arthritis    Depression    Flu    01/2017    GERD (gastroesophageal reflux disease)    Panic attack    panic attacks    RA (rheumatoid arthritis) ([facility])    Urinary tract infection    hx of 02/2016    Vitamin D deficiency     Past Surgical History:  Procedure Laterality Date   ABDOMINAL HYSTERECTOMY     ANTERIOR AND POSTERIOR REPAIR WITH SACROSPINOUS FIXATION N/A 12/13/2014   Procedure: ANTERIOR AND POSTERIOR REPAIR WITH SACROSPINOUS LIGAMENT SUSPENSION;  Surgeon: Harold Hedge, MD;  Location: [facility];  Service: Gynecology;  Laterality: N/A;   bladder tack surgery      BRONCHOSCOPY     COLONOSCOPY     TOTAL KNEE ARTHROPLASTY Left 04/07/2016   Procedure: LEFT  TOTAL KNEE ARTHROPLASTY;  Surgeon: Durene Romans, MD;  Location: [facility];   Service: Orthopedics;  Laterality: Left;    Current Medications: Current Meds  Medication Sig   Acetaminophen (TYLENOL ARTHRITIS EXT RELIEF PO) Take by mouth. Take 2 tablets twice a day   citalopram (CELEXA) 20 MG tablet Take 20 mg by mouth daily.   hydroxychloroquine (PLAQUENIL) 200 MG tablet TAKE 1 TABLET BY MOUTH EVERY DAY WITH FOOD OR MILK   metoprolol succinate (TOPROL-XL) 25 MG 24 hr tablet TAKE 1 TABLET BY MOUTH DAILY WITH OR IMMEDIATELY FOLLOWING A MEAL. MAY TAKE AN ADDITIONAL TABLET IF HEART RATE IS PERSISTENTLY GREATER THAN 120.   rivaroxaban (XARELTO) 20 MG TABS tablet Take 1 tablet by mouth once daily   rosuvastatin (CRESTOR) 5 MG tablet Take 1 tablet by mouth once daily     Allergies:   Patient has no known allergies.   Social History   Socioeconomic History   Marital status: Married    Spouse name: Not on file   Number of children: Not on file   Years of education: Not on file   Highest education level: Not on file  Occupational History   Not on file  Tobacco Use  Smoking status: Former   Smokeless tobacco: Never  Building services engineer Use: Never used  Substance and Sexual Activity   Alcohol use: Yes    Comment: 2-3 glasses of wine some days "I probably drink  more than I should"   Drug use: No   Sexual activity: Yes    Birth control/protection: Post-menopausal  Other Topics Concern   Not on file  Social History Narrative   Not on file   Social Determinants of Health   Financial Resource Strain: Not on file  Food Insecurity: Not on file  Transportation Needs: Not on file  Physical Activity: Not on file  Stress: Not on file  Social Connections: Not on file     Family History: The patient's family history includes Breast cancer in her maternal aunt and sister; Melanoma in her niece; Other in her mother.  ROS:   Please see the history of present illness.    All other systems reviewed and are negative.  EKGs/Labs/Other Studies Reviewed:    The  following studies were reviewed today:  CT 04/10/2022:  IMPRESSION: 1. The left atrial appendage is a large chicken wing morphology without thrombus.   2. A 35 mm Watchman FLX device is recommended based on the above landing zone measurements (27.2 mm maximum diameter; 22% compression).   3. An inferior posterior IAS puncture site is recommended.   4. Optimal deployment angle: RAO 11 CRA 0   5. CAC score of 1287, which is 95th percentile for age-, sex-, and race-matched controls. Normal coronary origin.  EKG:  EKG is not ordered today.   Recent Labs: 04/06/2022: BUN 13; Creatinine, Ser 0.62; Potassium 5.2; Sodium 141  Recent Lipid Panel    Component Value Date/Time   CHOL 209 (H) 10/05/2014 0217   TRIG 42 10/05/2014 0217   HDL 76 10/05/2014 0217   CHOLHDL 2.8 10/05/2014 0217   VLDL 8 10/05/2014 0217   LDLCALC 125 (H) 10/05/2014 0217   Physical Exam:    VS:  BP (!) 152/84 (BP Location: Left Arm, Patient Position: Sitting, Cuff Size: Normal)   Pulse 71   Ht 5\' 8"  (1.727 m)   Wt 155 lb 6.4 oz (70.5 kg)   SpO2 98%   BMI 23.63 kg/m     Wt Readings from Last 3 Encounters:  06/12/22 155 lb 6.4 oz (70.5 kg)  03/30/22 155 lb 3.2 oz (70.4 kg)  11/26/21 161 lb (73 kg)    General: Well developed, well nourished, NAD Lungs:Clear to ausculation bilaterally. No wheezes, rales, or rhonchi. Breathing is unlabored. Cardiovascular: RRR with S1 S2. No murmurs Extremities: No edema.  Neuro: Alert and oriented. No focal deficits. No facial asymmetry. MAE spontaneously. Psych: Responds to questions appropriately with normal affect.    ASSESSMENT/PLAN:    PAF: Seen in consultation to pursue LAAO closure with Watchman scheduled for 06/25/22 with Dr. Lalla Brothers. Pre instructions reviewed with all questions answered. CHG soap given. Obtain CBC, BMET today.     Medication Adjustments/Labs and Tests Ordered: Current medicines are reviewed at length with the patient today.  Concerns regarding  medicines are outlined above.  Orders Placed This Encounter  Procedures   CBC   Basic metabolic panel   EKG 12-Lead   No orders of the defined types were placed in this encounter.   Patient Instructions  Medication Instructions:  Your physician recommends that you continue on your current medications as directed. Please refer to the Current Medication list given to you today.   *  If you need a refill on your cardiac medications before your next appointment, please call your pharmacy*   Lab Work: Bmp, Cbc - today   If you have labs (blood work) drawn today and your tests are completely normal, you will receive your results only by: MyChart Message (if you have MyChart) OR A paper copy in the mail If you have any lab test that is abnormal or we need to change your treatment, we will call you to review the results.   Testing/Procedures: You are scheduled for a Left Atrial Appendage Device Implantation on Thursday, May 9 with Dr. Steffanie Dunn. Please refer to instructions given.   Follow-Up: Your post-procedure visits will be arranged during your hospital stay. You will be discharged with dates and times of your follow-up appointments.   Other Instructions     Signed, Georgie Chard, NP  06/12/2022 1:56 PM    [facility]

## 2022-06-12 NOTE — Patient Instructions (Addendum)
Medication Instructions:  Your physician recommends that you continue on your current medications as directed. Please refer to the Current Medication list given to you today.   *If you need a refill on your cardiac medications before your next appointment, please call your pharmacy*   Lab Work: Bmp, Cbc - today   If you have labs (blood work) drawn today and your tests are completely normal, you will receive your results only by: MyChart Message (if you have MyChart) OR A paper copy in the mail If you have any lab test that is abnormal or we need to change your treatment, we will call you to review the results.   Testing/Procedures: You are scheduled for a Left Atrial Appendage Device Implantation on Thursday, May 9 with Dr. Steffanie Dunn. Please refer to instructions given.   Follow-Up: Your post-procedure visits will be arranged during your hospital stay. You will be discharged with dates and times of your follow-up appointments.   Other Instructions

## 2022-06-13 LAB — BASIC METABOLIC PANEL
BUN/Creatinine Ratio: 18 (ref 12–28)
BUN: 12 mg/dL (ref 8–27)
CO2: 22 mmol/L (ref 20–29)
Calcium: 9.8 mg/dL (ref 8.7–10.3)
Chloride: 102 mmol/L (ref 96–106)
Creatinine, Ser: 0.68 mg/dL (ref 0.57–1.00)
Glucose: 89 mg/dL (ref 70–99)
Potassium: 4.7 mmol/L (ref 3.5–5.2)
Sodium: 140 mmol/L (ref 134–144)
eGFR: 90 mL/min/{1.73_m2} (ref >59)

## 2022-06-13 LAB — CBC
Hematocrit: 42.3 % (ref 34.0–46.6)
Hemoglobin: 14.2 g/dL (ref 11.1–15.9)
MCH: 31.6 pg (ref 26.6–33.0)
MCHC: 33.6 g/dL (ref 31.5–35.7)
MCV: 94 fL (ref 79–97)
Platelets: 195 10*3/uL (ref 150–450)
RBC: 4.49 x10E6/uL (ref 3.77–5.28)
RDW: 13.1 % (ref 11.7–15.4)
WBC: 6.2 10*3/uL (ref 3.4–10.8)

## 2022-06-18 ENCOUNTER — Telehealth: Payer: Self-pay | Admitting: Cardiology

## 2022-06-18 NOTE — Telephone Encounter (Signed)
Patient is requesting call back to discuss upcoming procedure and the reasoning for an overnight stay she is being billed for. She states that [facility] recommended that she call and get clarification on if this is needed. Please advise.

## 2022-06-18 NOTE — Telephone Encounter (Signed)
Spoke with the patient at length after discussing with Dorathy Daft with Billing.  She understands that per Medicare guidelines, Watchman is coded as an inpatient procedure regardless of if she has a same day discharge or spends the night after her procedure. She understands her copay is not set by [facility].  She understands that since she is third case, there is a 50/50 chance she will be discharged same day. If she is, her copay will be the same as if she spends the night.  Confirmed procedure date of 06/25/2022. Confirmed arrival time of 0900 for procedure time at 1130. Reviewed pre-procedure instructions with patient. The patient understands to call if questions/concerns arise prior to procedure.

## 2022-06-24 ENCOUNTER — Encounter: Payer: Self-pay | Admitting: Plastic Surgery

## 2022-06-25 ENCOUNTER — Inpatient Hospital Stay (HOSPITAL_COMMUNITY): Payer: Medicare Other | Admitting: Anesthesiology

## 2022-06-25 ENCOUNTER — Encounter (HOSPITAL_COMMUNITY): Payer: Self-pay | Admitting: Cardiology

## 2022-06-25 ENCOUNTER — Other Ambulatory Visit (HOSPITAL_COMMUNITY): Payer: Self-pay

## 2022-06-25 ENCOUNTER — Other Ambulatory Visit: Payer: Self-pay

## 2022-06-25 ENCOUNTER — Inpatient Hospital Stay (HOSPITAL_COMMUNITY): Payer: Medicare Other

## 2022-06-25 ENCOUNTER — Other Ambulatory Visit: Payer: Self-pay | Admitting: Cardiology

## 2022-06-25 ENCOUNTER — Encounter (HOSPITAL_COMMUNITY)
Admission: RE | Disposition: A | Discharge status: Home / Self Care | Payer: Self-pay | Source: Home / Self Care | Attending: Cardiology

## 2022-06-25 ENCOUNTER — Inpatient Hospital Stay (HOSPITAL_COMMUNITY)
Admission: RE | Admit: 2022-06-25 | Discharge: 2022-06-25 | DRG: 274 | Disposition: A | Discharge status: Home / Self Care | Payer: Medicare Other | Attending: Cardiology | Admitting: Cardiology

## 2022-06-25 DIAGNOSIS — F418 Other specified anxiety disorders: Secondary | ICD-10-CM | POA: Diagnosis not present

## 2022-06-25 DIAGNOSIS — Z7901 Long term (current) use of anticoagulants: Secondary | ICD-10-CM | POA: Diagnosis not present

## 2022-06-25 DIAGNOSIS — I4891 Unspecified atrial fibrillation: Principal | ICD-10-CM

## 2022-06-25 DIAGNOSIS — E559 Vitamin D deficiency, unspecified: Secondary | ICD-10-CM | POA: Diagnosis present

## 2022-06-25 DIAGNOSIS — I480 Paroxysmal atrial fibrillation: Secondary | ICD-10-CM

## 2022-06-25 DIAGNOSIS — Z87891 Personal history of nicotine dependence: Secondary | ICD-10-CM | POA: Diagnosis not present

## 2022-06-25 DIAGNOSIS — Z808 Family history of malignant neoplasm of other organs or systems: Secondary | ICD-10-CM | POA: Diagnosis not present

## 2022-06-25 DIAGNOSIS — Z96653 Presence of artificial knee joint, bilateral: Secondary | ICD-10-CM | POA: Diagnosis present

## 2022-06-25 DIAGNOSIS — Z803 Family history of malignant neoplasm of breast: Secondary | ICD-10-CM

## 2022-06-25 DIAGNOSIS — Z95818 Presence of other cardiac implants and grafts: Secondary | ICD-10-CM | POA: Insufficient documentation

## 2022-06-25 DIAGNOSIS — M069 Rheumatoid arthritis, unspecified: Secondary | ICD-10-CM | POA: Diagnosis present

## 2022-06-25 DIAGNOSIS — Z006 Encounter for examination for normal comparison and control in clinical research program: Secondary | ICD-10-CM

## 2022-06-25 DIAGNOSIS — Z8616 Personal history of COVID-19: Secondary | ICD-10-CM

## 2022-06-25 DIAGNOSIS — M1990 Unspecified osteoarthritis, unspecified site: Secondary | ICD-10-CM | POA: Diagnosis present

## 2022-06-25 DIAGNOSIS — Z90710 Acquired absence of both cervix and uterus: Secondary | ICD-10-CM | POA: Diagnosis not present

## 2022-06-25 DIAGNOSIS — K7460 Unspecified cirrhosis of liver: Secondary | ICD-10-CM | POA: Diagnosis present

## 2022-06-25 DIAGNOSIS — K219 Gastro-esophageal reflux disease without esophagitis: Secondary | ICD-10-CM | POA: Diagnosis present

## 2022-06-25 HISTORY — PX: LEFT ATRIAL APPENDAGE OCCLUSION: EP1229

## 2022-06-25 HISTORY — DX: Presence of other cardiac implants and grafts: Z95.818

## 2022-06-25 HISTORY — PX: TEE WITHOUT CARDIOVERSION: SHX5443

## 2022-06-25 LAB — TYPE AND SCREEN
ABO/RH(D): O POS
Antibody Screen: NEGATIVE

## 2022-06-25 LAB — POCT ACTIVATED CLOTTING TIME: Activated Clotting Time: 423 seconds

## 2022-06-25 LAB — SURGICAL PCR SCREEN
MRSA, PCR: NEGATIVE
Staphylococcus aureus: NEGATIVE

## 2022-06-25 LAB — ECHO TEE

## 2022-06-25 SURGERY — LEFT ATRIAL APPENDAGE OCCLUSION
Anesthesia: General

## 2022-06-25 MED ORDER — CEFAZOLIN SODIUM-DEXTROSE 2-4 GM/100ML-% IV SOLN
2.0000 g | INTRAVENOUS | Status: DC
  Filled 2022-06-25: qty 100

## 2022-06-25 MED ORDER — ONDANSETRON HCL 4 MG/2ML IJ SOLN: 4.0000 mg | Freq: Four times a day (QID) | INTRAMUSCULAR | Status: DC | PRN

## 2022-06-25 MED ORDER — SODIUM CHLORIDE 0.9 % IV SOLN: INTRAVENOUS | Status: DC

## 2022-06-25 MED ORDER — HEPARIN (PORCINE) IN NACL 2000-0.9 UNIT/L-% IV SOLN
INTRAVENOUS | Status: DC | PRN
  Administered 2022-06-25: 1000 mL

## 2022-06-25 MED ORDER — PROTAMINE SULFATE 10 MG/ML IV SOLN
INTRAVENOUS | Status: DC | PRN
  Administered 2022-06-25 (×2): 10 mg via INTRAVENOUS
  Administered 2022-06-25: 5 mg via INTRAVENOUS

## 2022-06-25 MED ORDER — PHENYLEPHRINE 80 MCG/ML (10ML) SYRINGE FOR IV PUSH (FOR BLOOD PRESSURE SUPPORT)
PREFILLED_SYRINGE | INTRAVENOUS | Status: DC | PRN
  Administered 2022-06-25: 80 ug via INTRAVENOUS

## 2022-06-25 MED ORDER — ONDANSETRON HCL 4 MG/2ML IJ SOLN
INTRAMUSCULAR | Status: DC | PRN
  Administered 2022-06-25: 4 mg via INTRAVENOUS

## 2022-06-25 MED ORDER — CHLORHEXIDINE GLUCONATE 0.12 % MT SOLN
OROMUCOSAL | Status: AC
  Filled 2022-06-25: qty 15

## 2022-06-25 MED ORDER — CHLORHEXIDINE GLUCONATE 0.12 % MT SOLN
15.0000 mL | Freq: Once | OROMUCOSAL | Status: AC
  Administered 2022-06-25: 15 mL via OROMUCOSAL
  Filled 2022-06-25: qty 15

## 2022-06-25 MED ORDER — PHENYLEPHRINE HCL-NACL 20-0.9 MG/250ML-% IV SOLN
INTRAVENOUS | Status: DC | PRN
  Administered 2022-06-25: 45 ug/min via INTRAVENOUS

## 2022-06-25 MED ORDER — SODIUM CHLORIDE 0.9 % IV SOLN: 250.0000 mL | INTRAVENOUS | Status: DC | PRN

## 2022-06-25 MED ORDER — ACETAMINOPHEN 325 MG PO TABS: 650.0000 mg | ORAL_TABLET | ORAL | Status: DC | PRN

## 2022-06-25 MED ORDER — DEXAMETHASONE SODIUM PHOSPHATE 10 MG/ML IJ SOLN
INTRAMUSCULAR | Status: DC | PRN
  Administered 2022-06-25: 4 mg via INTRAVENOUS

## 2022-06-25 MED ORDER — HEPARIN SODIUM (PORCINE) 1000 UNIT/ML IJ SOLN
INTRAMUSCULAR | Status: DC | PRN
  Administered 2022-06-25: 11000 [IU] via INTRAVENOUS

## 2022-06-25 MED ORDER — LIDOCAINE 2% (20 MG/ML) 5 ML SYRINGE
INTRAMUSCULAR | Status: DC | PRN
  Administered 2022-06-25: 60 mg via INTRAVENOUS

## 2022-06-25 MED ORDER — HEPARIN (PORCINE) IN NACL 1000-0.9 UT/500ML-% IV SOLN
INTRAVENOUS | Status: DC | PRN
  Administered 2022-06-25: 500 mL

## 2022-06-25 MED ORDER — LABETALOL HCL 5 MG/ML IV SOLN
INTRAVENOUS | Status: DC | PRN
  Administered 2022-06-25: 5 mg via INTRAVENOUS

## 2022-06-25 MED ORDER — ROCURONIUM BROMIDE 10 MG/ML (PF) SYRINGE
PREFILLED_SYRINGE | INTRAVENOUS | Status: DC | PRN
  Administered 2022-06-25: 50 mg via INTRAVENOUS

## 2022-06-25 MED ORDER — FENTANYL CITRATE (PF) 250 MCG/5ML IJ SOLN
INTRAMUSCULAR | Status: DC | PRN
  Administered 2022-06-25: 100 ug via INTRAVENOUS

## 2022-06-25 MED ORDER — IOHEXOL 350 MG/ML SOLN
INTRAVENOUS | Status: DC | PRN
  Administered 2022-06-25: 10 mL

## 2022-06-25 MED ORDER — SODIUM CHLORIDE 0.9% FLUSH: 3.0000 mL | INTRAVENOUS | Status: DC | PRN

## 2022-06-25 MED ORDER — PROPOFOL 10 MG/ML IV BOLUS
INTRAVENOUS | Status: DC | PRN
  Administered 2022-06-25: 150 mg via INTRAVENOUS

## 2022-06-25 MED ORDER — SUGAMMADEX SODIUM 200 MG/2ML IV SOLN
INTRAVENOUS | Status: DC | PRN
  Administered 2022-06-25: 200 mg via INTRAVENOUS

## 2022-06-25 MED ORDER — CHLORHEXIDINE GLUCONATE 4 % EX SOLN: 60.0000 mL | Freq: Once | CUTANEOUS | Status: DC

## 2022-06-25 MED ORDER — LACTATED RINGERS IV SOLN: INTRAVENOUS | Status: DC

## 2022-06-25 SURGICAL SUPPLY — 20 items
BLANKET WARM UNDERBOD FULL ACC (MISCELLANEOUS) ×1 IMPLANT
CATH DIAG 6FR PIGTAIL ANGLED (CATHETERS) IMPLANT
CLOSURE PERCLOSE PROSTYLE (VASCULAR PRODUCTS) IMPLANT
DEVICE WATCHMAN FLX PRO PROC (KITS) IMPLANT
DEVICE WATCHMAN FXD CRV PROC (KITS) IMPLANT
DILATOR VESSEL 38 20CM 14FR (INTRODUCER) IMPLANT
KIT HEART LEFT (KITS) ×1 IMPLANT
KIT SHEA VERSACROSS LAAC CONNE (KITS) IMPLANT
PACK CARDIAC CATHETERIZATION (CUSTOM PROCEDURE TRAY) ×1 IMPLANT
PAD DEFIB RADIO PHYSIO CONN (PAD) ×1 IMPLANT
SHEATH PERFORMER 16FR 30 (SHEATH) IMPLANT
SHEATH PINNACLE 8F 10CM (SHEATH) IMPLANT
SHEATH PROBE COVER 6X72 (BAG) ×1 IMPLANT
SYS WATCHMAN FXD DBL (SHEATH) ×1
SYSTEM WATCHMAN FXD DBL (SHEATH) IMPLANT
TRANSDUCER W/STOPCOCK (MISCELLANEOUS) ×1 IMPLANT
TUBING CIL FLEX 10 FLL-RA (TUBING) ×1 IMPLANT
WATCHMAN FLX PRO 31 (Prosthesis & Implant Heart) IMPLANT
WATCHMAN FLX PRO PROCEDURE (KITS) ×1 IMPLANT
WATCHMAN FXD CRV SYS PROCEDURE (KITS) ×1 IMPLANT

## 2022-06-25 NOTE — Transfer of Care (Signed)
Immediate Anesthesia Transfer of Care Note  Patient: [name] [name]  Procedure(s) Performed: LEFT ATRIAL APPENDAGE OCCLUSION TRANSESOPHAGEAL ECHOCARDIOGRAM  Patient Location: Cath Lab  Anesthesia Type:General  Level of Consciousness: awake, alert , and oriented  Airway & Oxygen Therapy: Patient Spontanous Breathing and Patient connected to nasal cannula oxygen  Post-op Assessment: Report given to RN and Post -op Vital signs reviewed and stable  Post vital signs: Reviewed and stable  Last Vitals:  Vitals Value Taken Time  BP    Temp    Pulse 70 06/25/22 1254  Resp 14 06/25/22 1254  SpO2 96 % 06/25/22 1254  Vitals shown include unvalidated device data.  Last Pain:  Vitals:   06/25/22 0954  PainSc: 0-No pain      Patients Stated Pain Goal: 0 (06/25/22 0954)  Complications: There were no known notable events for this encounter.

## 2022-06-25 NOTE — H&P (Signed)
Electrophysiology Office Note:     Date:  06/25/2022    ID:  [name], [name] [birth year], MRN [id number]   PCP:  Pearson Grippe, MD        [facility] Cardiologist:  Peter Swaziland, MD  [facility] Electrophysiologist:  Lanier Prude, MD    Referring MD: Pearson Grippe, MD    Chief Complaint: AF   History of Present Illness:     [name] [name] is a 77 y.o. female who presents for an evaluation of AF at the request of Dr Swaziland. Their medical history includes pAF, anxiety, RA, GERD and coronary artery calcium on CT.   The patient was last seen by Dr Swaziland 04/15/2021. At that appointment she was doing OK. Low burden of AF symptoms but desired a stroke risk mitigation strategy that minimized exposure to anticoagulation.   Today,   [name] [name] has come in today to discuss the the watchman procedure.    She contracted COVID two years ago, and four months later, she began having afib episodes. After a few episodes, she came to [place] and was prescribed Xarelto. Her last episode of afib was around a year ago.    When she had her tooth pulled, she bled for a week afterwards. She went into the ER and had a pad sewn in, which stopped the bleeding.    She has had a right knee replacement, and is considering a right shoulder replacement. She often gets cortisone injections in her knees and shoulders. Her last cortisone shot was in August.    She does not feel palpitations when she has episodes of Afib.    She denies any chest pain, shortness of breath, or peripheral edema. No lightheadedness, headaches, syncope, orthopnea, or PND.    Presents for LAAO.    Objective      Past Medical History:  Diagnosis Date   Anxiety disorder     Arthritis     Depression     Flu      01/2017    GERD (gastroesophageal reflux disease)     Panic attack      panic attacks    RA (rheumatoid arthritis) (HCC)     Urinary tract infection      hx of 02/2016    Vitamin D  deficiency             Past Surgical History:  Procedure Laterality Date   ABDOMINAL HYSTERECTOMY       ANTERIOR AND POSTERIOR REPAIR WITH SACROSPINOUS FIXATION N/A 12/13/2014    Procedure: ANTERIOR AND POSTERIOR REPAIR WITH SACROSPINOUS LIGAMENT SUSPENSION;  Surgeon: Harold Hedge, MD;  Location: [facility];  Service: Gynecology;  Laterality: N/A;   bladder tack surgery        BRONCHOSCOPY       COLONOSCOPY       TOTAL KNEE ARTHROPLASTY Left 04/07/2016    Procedure: LEFT  TOTAL KNEE ARTHROPLASTY;  Surgeon: Durene Romans, MD;  Location: [facility];  Service: Orthopedics;  Laterality: Left;      Current Medications: Active Medications      Current Meds  Medication Sig   Acetaminophen (TYLENOL ARTHRITIS EXT RELIEF PO) Take by mouth. Take 2 tablets twice a day   citalopram (CELEXA) 20 MG tablet Take 20 mg by mouth daily.   hydroxychloroquine (PLAQUENIL) 200 MG tablet TAKE 1 TABLET BY MOUTH EVERY DAY WITH FOOD OR MILK   metoprolol succinate (TOPROL-XL) 25 MG 24 hr tablet TAKE 1 TABLET BY MOUTH  DAILY WITH OR IMMEDIATELY FOLLOWING A MEAL. MAY TAKE AN ADDITIONAL TABLET IF HEART RATE IS PERSISTENTLY GREATER THAN 120.   rivaroxaban (XARELTO) 20 MG TABS tablet Take 1 tablet by mouth once daily   rosuvastatin (CRESTOR) 5 MG tablet Take 1 tablet by mouth once daily        Allergies:   Patient has no active allergies.    Social History         Socioeconomic History   Marital status: Married      Spouse name: Not on file   Number of children: Not on file   Years of education: Not on file   Highest education level: Not on file  Occupational History   Not on file  Tobacco Use   Smoking status: Former   Smokeless tobacco: Never  Vaping Use   Vaping Use: Never used  Substance and Sexual Activity   Alcohol use: Yes      Comment: 2-3 glasses of wine some days "I probably drink  more than I should"   Drug use: No   Sexual activity: Yes      Birth control/protection: Post-menopausal  Other  Topics Concern   Not on file  Social History Narrative   Not on file    Social Determinants of Health    Financial Resource Strain: Not on file  Food Insecurity: Not on file  Transportation Needs: Not on file  Physical Activity: Not on file  Stress: Not on file  Social Connections: Not on file      Family History: The patient's family history includes Breast cancer in her maternal aunt and sister; Melanoma in her niece; Other in her mother.   ROS:   Please see the history of present illness.      (+)Afib (+)Shoulder Pain   All other systems reviewed and are negative.   EKGs/Labs/Other Studies Reviewed:     The following studies were reviewed today:   10/11/2021 Echo EF 55-60   04/15/2021 ECG shows sinus with RBBB       Recent Labs: No results found for requested labs within last 365 days.  Recent Lipid Panel Labs (Brief)          Component Value Date/Time    CHOL 209 (H) 10/05/2014 0217    TRIG 42 10/05/2014 0217    HDL 76 10/05/2014 0217    CHOLHDL 2.8 10/05/2014 0217    VLDL 8 10/05/2014 0217    LDLCALC 125 (H) 10/05/2014 0217        Physical Exam:     VS:  BP 160/86   Pulse 76   Ht 5\' 8"  (1.727 m)   Wt 161 lb (73 kg)   SpO2 94%   BMI 24.48 kg/m         Wt Readings from Last 3 Encounters:  11/26/21 161 lb (73 kg)  04/15/21 156 lb 3.2 oz (70.9 kg)  04/18/20 158 lb 9.6 oz (71.9 kg)      GEN:  Well nourished, well developed in no acute distress HEENT: Normal NECK: No JVD; No carotid bruits LYMPHATICS: No lymphadenopathy CARDIAC: RRR, no murmurs, rubs, gallops RESPIRATORY:  Clear to auscultation without rales, wheezing or rhonchi  ABDOMEN: Soft, non-tender, non-distended MUSCULOSKELETAL:  No edema; No deformity  SKIN: Warm and dry NEUROLOGIC:  Alert and oriented x 3 PSYCHIATRIC:  Normal affect          Assessment ASSESSMENT:     1. Paroxysmal atrial fibrillation (HCC)     PLAN:  In order of problems listed above:   #pAF    Low burden of AF.      -----------------   I have seen [name] [name] in the office today who is being considered for a Watchman left atrial appendage closure device. I believe they will benefit from this procedure given their history of atrial fibrillation, CHA2DS2-VASc score of 4 and unadjusted ischemic stroke rate of 4.8% per year. The patient's chart has been reviewed and I feel that they would be a candidate for short term oral anticoagulation after Watchman implant.    It is my belief that after undergoing a LAA closure procedure, [name] [name] will not need long term anticoagulation which eliminates anticoagulation side effects and major bleeding risk.    Procedural risks for the Watchman implant have been reviewed with the patient including a 0.5% risk of stroke, <1% risk of perforation and <1% risk of device embolization. Other risks include bleeding, vascular damage, tamponade, worsening renal function, and death. The patient understands these riskss.     The published clinical data on the safety and effectiveness of WATCHMAN include but are not limited to the following: - Holmes DR, Everlene Farrier, Sick P et al. for the PROTECT AF Investigators. Percutaneous closure of the left atrial appendage versus warfarin therapy for prevention of stroke in patients with atrial fibrillation: a randomised non-inferiority trial. Lancet 2009; 374: 534-42. Everlene Farrier, Doshi SK, Isa Rankin D et al. on behalf of the PROTECT AF Investigators. Percutaneous Left Atrial Appendage Closure for Stroke Prophylaxis in Patients With Atrial Fibrillation 2.3-Year Follow-up of the PROTECT AF (Watchman Left Atrial Appendage System for Embolic Protection in Patients With Atrial Fibrillation) Trial. Circulation 2013; 127:720-729. - Alli O, Doshi S,  Kar S, Reddy VY, Sievert H et al. Quality of Life Assessment in the Randomized PROTECT AF (Percutaneous Closure of the Left Atrial Appendage Versus Warfarin  Therapy for Prevention of Stroke in Patients With Atrial Fibrillation) Trial of Patients at Risk for Stroke With Nonvalvular Atrial Fibrillation. J Am Coll Cardiol 2013; 61:1790-8. Aline August DR, Mia Creek, Price M, Whisenant B, Sievert H, Doshi S, Huber K, Reddy V. Prospective randomized evaluation of the Watchman left atrial appendage Device in patients with atrial fibrillation versus long-term warfarin therapy; the PREVAIL trial. Journal of the [employer] of Cardiology, Vol. 4, No. 1, 2014, 1-11. - Kar S, Doshi SK, Sadhu A, Horton R, Osorio J et al. Primary outcome evaluation of a next-generation left atrial appendage closure device: results from the PINNACLE FLX trial. Circulation 2021;143(18)1754-1762.      After today's visit with the patient which was dedicated solely for shared decision making visit regarding LAA closure device, the patient decided to proceed with the LAA appendage closure procedure scheduled to be done in the near future at [facility]. Prior to the procedure, I would like to obtain a gated CT scan of the chest with contrast timed for PV/LA visualization.    Additionally, the patient will need a TTE.   HAS-BLED score 1 Hypertension No  Abnormal renal and liver function (Dialysis, transplant, Cr >2.26 mg/dL /Cirrhosis or Bilirubin >2x Normal or AST/ALT/AP >3x Normal) No  Stroke No  Bleeding  No Labile INR (Unstable/high INR) No  Elderly (>65) Yes  Drugs or alcohol (? 8 drinks/week, anti-plt or NSAID) No    CHA2DS2-VASc Score = 4  The patient's score is based upon: CHF History: 0 HTN History: 0 Diabetes History: 0 Stroke History: 0 Vascular Disease  History: 1 Age Score: 2 Gender Score: 1    Presents today for LAAO.     Signed, Rossie Muskrat. Lalla Brothers, MD, [facility], [facility] 06/25/2022 Electrophysiology [facility]

## 2022-06-25 NOTE — Progress Notes (Signed)
  HEART AND VASCULAR CENTER   MULTIDISCIPLINARY HEART TEAM  Patient doing well s/p LAAO. She is hemodynamically stable. Groin site is stable. Plan for early ambulation after bedrest completed and discharge later today @ 6pm.   Georgie Chard NP-C Structural Heart Team  Pager: [phone] Phone: [phone]

## 2022-06-25 NOTE — Anesthesia Procedure Notes (Signed)
Procedure Name: Intubation Date/Time: 06/25/2022 11:41 AM  Performed by: Jodell Cipro, CRNAPre-anesthesia Checklist: Patient identified, Emergency Drugs available, Suction available and Patient being monitored Patient Re-evaluated:Patient Re-evaluated prior to induction Oxygen Delivery Method: Circle system utilized Preoxygenation: Pre-oxygenation with 100% oxygen Induction Type: IV induction Ventilation: Mask ventilation without difficulty Laryngoscope Size: Mac and 3 Grade View: Grade II Tube type: Oral Tube size: 7.0 mm Number of attempts: 1 Airway Equipment and Method: Stylet and Oral airway Placement Confirmation: ETT inserted through vocal cords under direct vision, positive ETCO2 and breath sounds checked- equal and bilateral Tube secured with: Tape Dental Injury: Teeth and Oropharynx as per pre-operative assessment

## 2022-06-25 NOTE — Progress Notes (Signed)
Pt arrived to unit from cath lab ,A/O x 4,  CCMD called, pt oriented to unit,Will continue to monitor. Right groin level 0. Looks to have some superficial bleeding will keep a eye on , soft to touch  Everlean Cherry, RN    06/25/22 1510  Vitals  Temp 98 F (36.7 C)  Temp Source Oral  BP 122/62  MAP (mmHg) 81  BP Location Right Arm  BP Method Automatic  Patient Position (if appropriate) Lying  Pulse Rate 68  Pulse Rate Source Monitor  ECG Heart Rate 67  Resp 18  Level of Consciousness  Level of Consciousness Alert  Oxygen Therapy  SpO2 94 %  O2 Device Room Air  O2 Flow Rate (L/min) 0 L/min  Pain Assessment  Pain Scale 0-10  Pain Score 0  MEWS Score  MEWS Temp 0  MEWS Systolic 0  MEWS Pulse 0  MEWS RR 0  MEWS LOC 0  MEWS Score 0  MEWS Score Color Chilton Si

## 2022-06-25 NOTE — Progress Notes (Signed)
Pt discharged. Right groin level 0, IV's removed. Pt urinated prior to discharge   Everlean Cherry, RN

## 2022-06-25 NOTE — Anesthesia Preprocedure Evaluation (Signed)
Anesthesia Evaluation  Patient identified by MRN, date of birth, ID band Patient awake    Reviewed: Allergy & Precautions, H&P , NPO status , Patient's Chart, lab work & pertinent test results  Airway Mallampati: II   Neck ROM: full    Dental   Pulmonary former smoker   breath sounds clear to auscultation       Cardiovascular + dysrhythmias Atrial Fibrillation  Rhythm:irregular Rate:Normal     Neuro/Psych  PSYCHIATRIC DISORDERS Anxiety Depression       GI/Hepatic ,GERD  ,,  Endo/Other    Renal/GU      Musculoskeletal  (+) Arthritis ,    Abdominal   Peds  Hematology   Anesthesia Other Findings   Reproductive/Obstetrics                             Anesthesia Physical Anesthesia Plan  ASA: 3  Anesthesia Plan: General   Post-op Pain Management:    Induction: Intravenous  PONV Risk Score and Plan: 3 and Ondansetron, Dexamethasone and Treatment may vary due to age or medical condition  Airway Management Planned: Oral ETT  Additional Equipment: ClearSight  Intra-op Plan:   Post-operative Plan: Extubation in OR  Informed Consent: I have reviewed the patients History and Physical, chart, labs and discussed the procedure including the risks, benefits and alternatives for the proposed anesthesia with the patient or authorized representative who has indicated his/her understanding and acceptance.     Dental advisory given  Plan Discussed with: CRNA, Anesthesiologist and Surgeon  Anesthesia Plan Comments:        Anesthesia Quick Evaluation

## 2022-06-25 NOTE — Discharge Summary (Signed)
HEART AND VASCULAR CENTER    Patient ID: [name] [name],  MRN: [id number], DOB/AGE: Aug 14, 1945 77 y.o.  Admit date: 06/25/2022 Discharge date: 06/25/2022  Primary Care Physician: [name] Fick, MD  Primary Cardiologist: [name] Swaziland, MD  Electrophysiologist: [name] Prude, MD  Primary Discharge Diagnosis:  Paroxysmal Atrial Fibrillation Poor candidacy for long term anticoagulation due to  the need for NSAID use for severe arthritic pain  Procedures This Admission:  Transeptal Puncture Intra-procedural TEE which showed no LAA thrombus Left atrial appendage occlusive device placement on 06/25/22 by Dr. Lalla Brothers.   Brief HPI: [name] [name] is a 77 y.o. female with a history of PAF, anxiety, RA, GERD, and elevated coronary calcium score on CT.    [name] [name] is followed by Dr. Swaziland for her cardiology care. She has a low AF burden however she was interested in stroke risk mitigation to limited exposure to anticoagulation given the need to medication with NSAIDs for arthritic pain. In consultation she was felt to be a good candidate to proceed with LAAO closure scheduled for 06/25/22.   Hospital Course:  The patient was admitted and underwent left atrial appendage occlusive device placement with 31mm Watchman Pro device. She was monitored post procedure and was doing very well. Given this, she is being considered for same day discharge later today. Groin site has been without complication. Post procedure instructions reviewed with understanding. The patient has been scheduled for post procedure follow up with Georgie Chard, NP in approximately 1 month. She will restart Xarelto later this evening and continue for 45 days at which time she will stop and start Plavix 75mg  QD and continue through 6 months therapy. A CT scan will be scheduled in approximately 60 days to ensure device seal and no thrombus formation. She will require 6 months of dental SBE. This will be RX'ed at  follow up.   Physical Exam: Vitals:   06/25/22 1400 06/25/22 1415 06/25/22 1430 06/25/22 1510  BP: 136/67 112/67 120/63 122/62  Pulse: 64 61 63 68  Resp: 12 14 17 18   Temp:    98 F (36.7 C)  TempSrc:    Oral  SpO2: 99% 97% 94% 94%  Weight:      Height:       Labs:   Lab Results  Component Value Date   WBC 6.2 06/12/2022   HGB 14.2 06/12/2022   HCT 42.3 06/12/2022   MCV 94 06/12/2022   PLT 195 06/12/2022   No results for input(s): "NA", "K", "CL", "CO2", "BUN", "CREATININE", "CALCIUM", "PROT", "BILITOT", "ALKPHOS", "ALT", "AST", "GLUCOSE" in the last 168 hours.  Invalid input(s): "LABALBU"   Discharge Medications:  Allergies as of 06/25/2022   No Known Allergies      Medication List     TAKE these medications    acetaminophen 500 MG tablet Commonly known as: TYLENOL Take 1,000 mg by mouth 2 (two) times daily.   citalopram 20 MG tablet Commonly known as: CELEXA Take 20 mg by mouth in the morning.   cyanocobalamin 1000 MCG tablet Commonly known as: VITAMIN B12 Take 1,000 mcg by mouth daily.   hydroxychloroquine 200 MG tablet Commonly known as: PLAQUENIL Take 200 mg by mouth in the morning.   metoprolol succinate 25 MG 24 hr tablet Commonly known as: TOPROL-XL TAKE 1 TABLET BY MOUTH DAILY WITH OR IMMEDIATELY FOLLOWING A MEAL. MAY TAKE AN ADDITIONAL TABLET IF HEART RATE IS PERSISTENTLY GREATER THAN 120.   rosuvastatin 5 MG tablet Commonly  known as: CRESTOR Take 1 tablet by mouth once daily What changed: when to take this   Vitamin D3 50 MCG (2000 UT) capsule Take 2,000 Units by mouth daily.   Xarelto 20 MG Tabs tablet Generic drug: rivaroxaban Take 1 tablet by mouth once daily What changed:  how much to take when to take this        Disposition:  Home  Discharge Instructions     Call MD for:  difficulty breathing, headache or visual disturbances   Complete by: As directed    Call MD for:  extreme fatigue   Complete by: As directed     Call MD for:  hives   Complete by: As directed    Call MD for:  persistant dizziness or light-headedness   Complete by: As directed    Call MD for:  persistant nausea and vomiting   Complete by: As directed    Call MD for:  redness, tenderness, or signs of infection (pain, swelling, redness, odor or green/yellow discharge around incision site)   Complete by: As directed    Call MD for:  severe uncontrolled pain   Complete by: As directed    Call MD for:  temperature >100.4   Complete by: As directed    Diet - low sodium heart healthy   Complete by: As directed    Discharge instructions   Complete by: As directed    [facility] Procedure, Care After  Procedure MD: Dr. Isidoro Donning Clinical Coordinator: Karsten Fells, RN  This sheet gives you information about how to care for yourself after your procedure. Your health care provider may also give you more specific instructions. If you have problems or questions, contact your health care provider.  What can I expect after the procedure? After the procedure, it is common to have: Bruising around your puncture site. Tenderness around your puncture site. Tiredness (fatigue).  Medication instructions It is very important to continue to take your blood thinner as directed by your doctor after the Watchman procedure. Call your procedure doctor's office with question or concerns. If you are on Coumadin (warfarin), you will have your INR checked the week after your procedure, with a goal INR of 2.0 - 3.0. Please follow your medication instructions on your discharge summary. Only take the medications listed on your discharge paperwork.  Follow up You will be seen in 1 month after your procedure You will have a repeat CT scan approximately 8 weeks after your procedure mark to check your device You will follow up the MD/APP who performed your procedure 6 months after your procedure The Watchman Clinical Coordinator will check in with you from  time to time, including 1 and 2 years after your procedure.    Follow these instructions at home: Puncture site care  Follow instructions from your health care provider about how to take care of your puncture site. Make sure you: If present, leave stitches (sutures), skin glue, or adhesive strips in place.  If a large square bandage is present, this may be removed 24 hours after surgery.  Check your puncture site every day for signs of infection. Check for: Redness, swelling, or pain. Fluid or blood. If your puncture site starts to bleed, lie down on your back, apply firm pressure to the area, and contact your health care provider. Warmth. Pus or a bad smell. Driving Do not drive yourself home if you received sedation Do not drive for at least 4 days after your procedure or however  long your health care provider recommends. (Do not resume driving if you have previously been instructed not to drive for other health reasons.) Do not spend greater than 1 hour at a time in a car for the first 3 days. Stop and take a break with a 5 minute walk at least every hour.  Do not drive or use heavy machinery while taking prescription pain medicine.  Activity Avoid activities that take a lot of effort, including exercise, for at least 7 days after your procedure. For the first 3 days, avoid sitting for longer than one hour at a time.  Avoid alcoholic beverages, signing paperwork, or participating in legal proceedings for 24 hours after receiving sedation Do not lift anything that is heavier than 10 lb (4.5 kg) for one week.  No sexual activity for 1 week.  Return to your normal activities as told by your health care provider. Ask your health care provider what activities are safe for you. General instructions Take over-the-counter and prescription medicines only as told by your health care provider. Do not use any products that contain nicotine or tobacco, such as cigarettes and e-cigarettes. If you  need help quitting, ask your health care provider. You may shower after 24 hours, but Do not take baths, swim, or use a hot tub for 1 week.  Do not drink alcohol for 24 hours after your procedure. Keep all follow-up visits as told by your health care provider. This is important. Dental Work: You will require antibiotics prior to any dental work, including cleanings, for 6 months after your Watchman implantation to help protect you from infection. After 6 months, antibiotics are no longer required. Contact a health care provider if: You have redness, mild swelling, or pain around your puncture site. You have soreness in your throat or at your puncture site that does not improve after several days You have fluid or blood coming from your puncture site that stops after applying firm pressure to the area. Your puncture site feels warm to the touch. You have pus or a bad smell coming from your puncture site. You have a fever. You have chest pain or discomfort that spreads to your neck, jaw, or arm. You are sweating a lot. You feel nauseous. You have a fast or irregular heartbeat. You have shortness of breath. You are dizzy or light-headed and feel the need to lie down. You have pain or numbness in the arm or leg closest to your puncture site. Get help right away if: Your puncture site suddenly swells. Your puncture site is bleeding and the bleeding does not stop after applying firm pressure to the area. These symptoms may represent a serious problem that is an emergency. Do not wait to see if the symptoms will go away. Get medical help right away. Call your local emergency services (911 in the [place] Do not drive yourself to the hospital. Summary After the procedure, it is normal to have bruising and tenderness at the puncture site in your groin, neck, or forearm. Check your puncture site every day for signs of infection. Get help right away if your puncture site is bleeding and the bleeding  does not stop after applying firm pressure to the area. This is a medical emergency.  This information is not intended to replace advice given to you by your health care provider. Make sure you discuss any questions you have with your health care provider.   Increase activity slowly   Complete by: As directed  Follow-up Information     Filbert Schilder, NP Follow up on 08/03/2022.   Specialty: Cardiology Why: @ [phone]. Contact information: [address] [place] [address] [phone]                 Duration of Discharge Encounter: Greater than 30 minutes including physician time.  Signed, Georgie Chard, NP  06/25/2022 3:18 PM

## 2022-06-25 NOTE — Progress Notes (Signed)
Rechecked right groin .Gauze dressing bloody. Holding pressure.  Provider on called paged. Pending call back    Everlean Cherry, RN

## 2022-06-25 NOTE — Progress Notes (Signed)
Discharge instructions given. Patient verbalized understanding and all questions were answered.  ?

## 2022-06-25 NOTE — Progress Notes (Signed)
Pressure held for 20 min. Bleeding stopped oozing on right groin. Will continue to check . PA made aware   Everlean Cherry, RN

## 2022-06-26 ENCOUNTER — Encounter: Payer: Self-pay | Admitting: Plastic Surgery

## 2022-06-26 ENCOUNTER — Telehealth: Payer: Self-pay | Admitting: Cardiology

## 2022-06-26 ENCOUNTER — Encounter (HOSPITAL_COMMUNITY): Payer: Self-pay | Admitting: Cardiology

## 2022-06-26 NOTE — Telephone Encounter (Signed)
  [facility]   MULTIDISCIPLINARY HEART VALVE TEAM   Patient contacted regarding discharge from [facility] on 06/25/22   Patient understands to follow up with provider Georgie Chard on 08/03/22 Patient understands discharge instructions? Yes  Patient understands medications and regimen? Yes  Patient understands to bring all medications to this visit? Yes   Georgie Chard NP-C Structural Heart Team  Pager: [phone]

## 2022-06-29 NOTE — Anesthesia Postprocedure Evaluation (Signed)
Anesthesia Post Note  Patient: [name] [name]  Procedure(s) Performed: LEFT ATRIAL APPENDAGE OCCLUSION TRANSESOPHAGEAL ECHOCARDIOGRAM     Patient location during evaluation: Cath Lab Anesthesia Type: General Level of consciousness: awake and alert Pain management: pain level controlled Vital Signs Assessment: post-procedure vital signs reviewed and stable Respiratory status: spontaneous breathing, nonlabored ventilation, respiratory function stable and patient connected to nasal cannula oxygen Cardiovascular status: blood pressure returned to baseline and stable Postop Assessment: no apparent nausea or vomiting Anesthetic complications: no   There were no known notable events for this encounter.  Last Vitals:  Vitals:   06/25/22 1511 06/25/22 1556  BP: 122/62 139/65  Pulse: 66 68  Resp: 12 20  Temp:  36.8 C  SpO2: 98% 99%    Last Pain:  Vitals:   06/25/22 1556  TempSrc: Oral  PainSc: 0-No pain                 Roben Schliep S

## 2022-07-03 ENCOUNTER — Other Ambulatory Visit: Payer: Self-pay | Admitting: Cardiology

## 2022-07-14 ENCOUNTER — Other Ambulatory Visit: Payer: Self-pay | Admitting: *Deleted

## 2022-07-14 MED ORDER — METOPROLOL SUCCINATE ER 25 MG PO TB24: ORAL_TABLET | ORAL | 3 refills | Status: DC

## 2022-07-27 ENCOUNTER — Other Ambulatory Visit: Payer: Self-pay | Admitting: Internal Medicine

## 2022-07-27 DIAGNOSIS — Z1231 Encounter for screening mammogram for malignant neoplasm of breast: Secondary | ICD-10-CM

## 2022-07-31 NOTE — Progress Notes (Unsigned)
[name]                                     Cardiology Office Note:    Date:  08/04/2022   ID:  [name] [name], DOB 11/02/1945, MRN [id number]  PCP:  [name] Fick, MD  [facility] Cardiologist:  Peter Swaziland, MD  [facility] Electrophysiologist:  Lanier Prude, MD   Referring MD: [name] Fick, MD   Chief Complaint  Patient presents with   Follow-up    1 month s/p LAAO    History of Present Illness:    [name] [name] is a 77 y.o. female with a hx of [name], [name] [name], [name] [name], [name] [name], [name] PAF who underwent LAAO closure with a 31mm Watchman FLX Pro.    [name] [name] is followed by Dr. Swaziland for her cardiology care. She has a low AF burden however she was interested in stroke risk mitigation to limited exposure to anticoagulation given the need to medication with NSAIDs for arthritic pain. In consultation she was felt to be a good candidate to proceed [name] is now s/p LAAO closure with 31mm Watchman FLX Pro device on 06/25/22.   She was restarted on Xarelto with a plan to continue for 45 days (6/23) then transition to Plavix 75mg  (6/24) for a total duration of 6 months (11/9).   Today she is here alone [name] reports that she has been doing very well with no issues since her procedure. She denies chest pain, SOB, palpitations, LE edema, orthopnea, PND, dizziness, or syncope. Denies bleeding in stool or urine.    Past Medical History:  Diagnosis Date   [name] disorder    Arthritis    Depression    Flu    01/2017    [name] [name] (gastroesophageal reflux disease)    Panic attack    panic attacks    Presence of Watchman left atrial appendage closure device 06/25/2022   Watchman FLX Pro 31mm placed by Dr. Lalla Brothers   [name] [name] (rheumatoid arthritis) [facility])    Urinary tract infection    hx of 02/2016    Vitamin D deficiency     Past Surgical History:  Procedure Laterality Date   ABDOMINAL HYSTERECTOMY     ANTERIOR [name]  POSTERIOR REPAIR WITH SACROSPINOUS FIXATION N/A 12/13/2014   Procedure: ANTERIOR [name] POSTERIOR REPAIR WITH SACROSPINOUS LIGAMENT SUSPENSION;  Surgeon: Harold Hedge, MD;  Location: [facility] ORS;  Service: Gynecology;  Laterality: N/A;   bladder tack surgery      BRONCHOSCOPY     COLONOSCOPY     LEFT ATRIAL APPENDAGE OCCLUSION N/A 06/25/2022   Procedure: LEFT ATRIAL APPENDAGE OCCLUSION;  Surgeon: Lanier Prude, MD;  Location: [facility] INVASIVE CV LAB;  Service: Cardiovascular;  Laterality: N/A;   TEE WITHOUT CARDIOVERSION N/A 06/25/2022   Procedure: TRANSESOPHAGEAL ECHOCARDIOGRAM;  Surgeon: Lanier Prude, MD;  Location: [facility] INVASIVE CV LAB;  Service: Cardiovascular;  Laterality: N/A;   TOTAL KNEE ARTHROPLASTY Left 04/07/2016   Procedure: LEFT  TOTAL KNEE ARTHROPLASTY;  Surgeon: Durene Romans, MD;  Location: [facility] ORS;  Service: Orthopedics;  Laterality: Left;    Current Medications: Current Meds  Medication Sig   acetaminophen (TYLENOL) 500 MG tablet Take 1,000 mg by mouth 2 (two) times daily.   amoxicillin (AMOXIL) 500 MG tablet Take 4 tablets (2,000 mg total) by mouth as directed. 1 HOUR PRIOR TO DENTAL APPOINTMENTS  Cholecalciferol (VITAMIN D3) 50 MCG (2000 UT) capsule Take 2,000 Units by mouth daily.   citalopram (CELEXA) 20 MG tablet Take 20 mg by mouth in the morning.   clopidogrel (PLAVIX) 75 MG tablet Take 1 tablet (75 mg total) by mouth daily. START ON 08/10/22   cyanocobalamin (VITAMIN B12) 1000 MCG tablet Take 1,000 mcg by mouth daily.   hydroxychloroquine (PLAQUENIL) 200 MG tablet Take 200 mg by mouth in the morning.   metoprolol succinate (TOPROL-XL) 25 MG 24 hr tablet TAKE 1 TABLET BY MOUTH DAILY WITH OR IMMEDIATELY FOLLOWING A MEAL. MAY TAKE AN ADDITIONAL TABLET IF HEART RATE IS PERSISTENTLY GREATER THAN 120.   rosuvastatin (CRESTOR) 5 MG tablet Take 1 tablet by mouth once daily   [DISCONTINUED] rivaroxaban (XARELTO) 20 MG TABS tablet Take 1 tablet by mouth once daily (Patient taking  differently: Take 20 mg by mouth at bedtime.)     Allergies:   Patient has no known allergies.   Social History   Socioeconomic History   Marital status: Married    Spouse name: Not on file   Number of children: Not on file   Years of education: Not on file   Highest education level: Not on file  Occupational History   Not on file  Tobacco Use   Smoking status: Former   Smokeless tobacco: Never  Vaping Use   Vaping Use: Never used  Substance [name] Sexual Activity   Alcohol use: Yes    Comment: 2-3 glasses of wine some days "I probably drink  more than I should"   Drug use: No   Sexual activity: Yes    Birth control/protection: Post-menopausal  Other Topics Concern   Not on file  Social History Narrative   Not on file   Social Determinants of Health   Financial Resource Strain: Not on file  Food Insecurity: Not on file  Transportation Needs: Not on file  Physical Activity: Not on file  Stress: Not on file  Social Connections: Not on file    Family History: The patient's family history includes Breast cancer in her maternal aunt [name] sister; Melanoma in her niece; Other in her mother.  ROS:   Please see the history of present illness.    All other systems reviewed [name] are negative.  EKGs/Labs/Other Studies Reviewed:    The following studies were reviewed today:  Watchman 06/25/22:  Primary Discharge Diagnosis:  Paroxysmal Atrial Fibrillation Poor candidacy for long term anticoagulation due to  the need for NSAID use for severe arthritic pain   Procedures This Admission:  Transeptal Puncture Intra-procedural TEE which showed no LAA thrombus Left atrial appendage occlusive device placement on 06/25/22 by Dr. Lalla Brothers.   EKG:  EKG is not ordered today.   Recent Labs: 06/12/2022: Hemoglobin 14.2; Platelets 195 08/03/2022: BUN 17; Creatinine, Ser 0.73; Potassium 4.6; Sodium 139   Recent Lipid Panel    Component Value Date/Time   CHOL 209 (H) 10/05/2014 0217    TRIG 42 10/05/2014 0217   HDL 76 10/05/2014 0217   CHOLHDL 2.8 10/05/2014 0217   VLDL 8 10/05/2014 0217   LDLCALC 125 (H) 10/05/2014 0217    Physical Exam:    VS:  BP 110/74   Pulse 81   Ht 5\' 8"  (1.727 m)   Wt 153 lb 9.6 oz (69.7 kg)   SpO2 98%   BMI 23.35 kg/m     Wt Readings from Last 3 Encounters:  08/03/22 153 lb 9.6 oz (69.7 kg)  06/25/22 153  lb (69.4 kg)  06/12/22 155 lb 6.4 oz (70.5 kg)    General: Well developed, well nourished, NAD Lungs:Clear to ausculation bilaterally. No wheezes, rales, or rhonchi. Breathing is unlabored. Cardiovascular: RRR with S1 S2. No murmurs Extremities: No edema. Neuro: Alert [name] oriented. No focal deficits. No facial asymmetry. MAE spontaneously. Psych: Responds to questions appropriately with normal affect.    ASSESSMENT/PLAN:    Paroxsymal atrial fibrillation: s/p LAAO closure with 31mm Watchman FLX Pro device on 5/9. She was restarted on Xarelto with a plan to continue for 45 days (6/23) then transition to Plavix 75mg  (6/24) for a total duration of 6 months (11/9). She will need dental SBE through 11/9. This was discussed [name] Amoxicillin was provided. Post implant [name] instructions reviewed with the patient with no questions. Obtain BMET today. Plan for 6 month follow up with myself. Will follow [name] results.   Medication Adjustments/Labs [name] Tests Ordered: Current medicines are reviewed at length with the patient today.  Concerns regarding medicines are outlined above.  Orders Placed This Encounter  Procedures   Basic metabolic panel   Meds ordered this encounter  Medications   clopidogrel (PLAVIX) 75 MG tablet    Sig: Take 1 tablet (75 mg total) by mouth daily. START ON 08/10/22    Dispense:  90 tablet    Refill:  3    STOP XARELTO ON 08/09/22 [name] START PLAVIX ON 08/10/22   rivaroxaban (XARELTO) 20 MG TABS tablet    Sig: Take 1 tablet (20 mg total) by mouth at bedtime. STOP ON 08/09/22    Dispense:  30 tablet    Refill:  0    STOP  XARELTO ON 08/09/22 [name] START PLAVIX ON 08/10/22   amoxicillin (AMOXIL) 500 MG tablet    Sig: Take 4 tablets (2,000 mg total) by mouth as directed. 1 HOUR PRIOR TO DENTAL APPOINTMENTS    Dispense:  12 tablet    Refill:  6    Patient Instructions  Medication Instructions:  Your physician has recommended you make the following change in your medication:  STOP XARLETO ON 08/09/22 START PLAVIX 75 MG DAILY ON 08/10/22 Start Amoxicillin 500 mg, take 4 tablets by mouth 1 hour prior to dental procedures [name] cleanings.   *If you need a refill on your cardiac medications before your next appointment, please call your pharmacy*   Lab Work: TODAY: BMET If you have labs (blood work) drawn today [name] your tests are completely normal, you will receive your results only by: MyChart Message (if you have MyChart) OR A paper copy in the mail If you have any lab test that is abnormal or we need to change your treatment, we will call you to review the results.   Testing/Procedures: SEE [name] INSTRUCTION LETTER   Follow-Up: At [facility], you [name] your health needs are our priority.  As part of our continuing mission to provide you with exceptional heart care, we have created designated Provider Care Teams.  These Care Teams include your primary Cardiologist (physician) [name] Advanced Practice Providers (APPs -  Physician Assistants [name] Nurse Practitioners) who all work together to provide you with the care you need, when you need it.  We recommend signing up for the patient portal called "MyChart".  Sign up information is provided on this After Visit Summary.  MyChart is used to connect with patients for Virtual Visits (Telemedicine).  Patients are able to view lab/test results, encounter notes, upcoming appointments, etc.  Non-urgent messages can be sent  to your provider as well.   To learn more about what you can do with MyChart, go to [email or web address].    Your next appointment:   KEEP  SCHEDULED FOLLOW-UP   Signed, Georgie Chard, NP  08/04/2022 9:06 AM    [facility]

## 2022-08-03 ENCOUNTER — Ambulatory Visit: Payer: Medicare Other | Attending: Cardiology | Admitting: Cardiology

## 2022-08-03 VITALS — BP 110/74 | HR 81 | Ht 68.0 in | Wt 153.6 lb

## 2022-08-03 DIAGNOSIS — Z95818 Presence of other cardiac implants and grafts: Secondary | ICD-10-CM

## 2022-08-03 DIAGNOSIS — I480 Paroxysmal atrial fibrillation: Secondary | ICD-10-CM

## 2022-08-03 LAB — BASIC METABOLIC PANEL
BUN/Creatinine Ratio: 23 (ref 12–28)
BUN: 17 mg/dL (ref 8–27)
CO2: 23 mmol/L (ref 20–29)
Calcium: 9.7 mg/dL (ref 8.7–10.3)
Chloride: 103 mmol/L (ref 96–106)
Creatinine, Ser: 0.73 mg/dL (ref 0.57–1.00)
Glucose: 97 mg/dL (ref 70–99)
Potassium: 4.6 mmol/L (ref 3.5–5.2)
Sodium: 139 mmol/L (ref 134–144)
eGFR: 85 mL/min/{1.73_m2} (ref >59)

## 2022-08-03 MED ORDER — CLOPIDOGREL BISULFATE 75 MG PO TABS: 75.0000 mg | ORAL_TABLET | Freq: Every day | ORAL | 3 refills | Status: DC

## 2022-08-03 MED ORDER — RIVAROXABAN 20 MG PO TABS
20.0000 mg | ORAL_TABLET | Freq: Every day | ORAL | 0 refills | Status: DC
Start: 2022-08-03 — End: 2022-09-07

## 2022-08-03 MED ORDER — AMOXICILLIN 500 MG PO TABS: 2000.0000 mg | ORAL_TABLET | ORAL | 6 refills | Status: DC

## 2022-08-03 NOTE — Patient Instructions (Addendum)
Medication Instructions:  Your physician has recommended you make the following change in your medication:  STOP XARLETO ON 08/09/22 START PLAVIX 75 MG DAILY ON 08/10/22 Start Amoxicillin 500 mg, take 4 tablets by mouth 1 hour prior to dental procedures and cleanings.   *If you need a refill on your cardiac medications before your next appointment, please call your pharmacy*   Lab Work: TODAY: BMET If you have labs (blood work) drawn today and your tests are completely normal, you will receive your results only by: MyChart Message (if you have MyChart) OR A paper copy in the mail If you have any lab test that is abnormal or we need to change your treatment, we will call you to review the results.   Testing/Procedures: SEE CT INSTRUCTION LETTER   Follow-Up: At [facility], you and your health needs are our priority.  As part of our continuing mission to provide you with exceptional heart care, we have created designated Provider Care Teams.  These Care Teams include your primary Cardiologist (physician) and Advanced Practice Providers (APPs -  Physician Assistants and Nurse Practitioners) who all work together to provide you with the care you need, when you need it.  We recommend signing up for the patient portal called "MyChart".  Sign up information is provided on this After Visit Summary.  MyChart is used to connect with patients for Virtual Visits (Telemedicine).  Patients are able to view lab/test results, encounter notes, upcoming appointments, etc.  Non-urgent messages can be sent to your provider as well.   To learn more about what you can do with MyChart, go to [email or web address].    Your next appointment:   KEEP SCHEDULED FOLLOW-UP

## 2022-08-14 ENCOUNTER — Ambulatory Visit: Payer: Medicare Other

## 2022-08-19 ENCOUNTER — Encounter: Payer: Self-pay | Admitting: Cardiology

## 2022-08-21 ENCOUNTER — Encounter: Payer: Self-pay | Admitting: Cardiology

## 2022-08-23 ENCOUNTER — Encounter: Payer: Self-pay | Admitting: Cardiology

## 2022-08-25 ENCOUNTER — Telehealth (HOSPITAL_COMMUNITY): Payer: Self-pay | Admitting: *Deleted

## 2022-08-25 NOTE — Telephone Encounter (Signed)
Attempted to call patient regarding upcoming cardiac CT appointment. °Left message on voicemail with name and callback number ° °Liyla Radliff RN Navigator Cardiac Imaging °[facility] °[phone] Office °[phone] Cell ° °

## 2022-08-26 ENCOUNTER — Ambulatory Visit (HOSPITAL_COMMUNITY)
Admission: RE | Admit: 2022-08-26 | Discharge: 2022-08-26 | Disposition: A | Discharge status: Home / Self Care | Payer: Medicare Other | Source: Ambulatory Visit | Attending: Cardiology | Admitting: Cardiology

## 2022-08-26 DIAGNOSIS — I480 Paroxysmal atrial fibrillation: Secondary | ICD-10-CM

## 2022-08-26 MED ORDER — IOHEXOL 350 MG/ML SOLN
100.0000 mL | Freq: Once | INTRAVENOUS | Status: AC | PRN
  Administered 2022-08-26: 100 mL via INTRAVENOUS

## 2022-08-28 ENCOUNTER — Ambulatory Visit
Admission: RE | Admit: 2022-08-28 | Discharge: 2022-08-28 | Disposition: A | Discharge status: Home / Self Care | Payer: Medicare Other | Source: Ambulatory Visit | Attending: Internal Medicine | Admitting: Internal Medicine

## 2022-08-28 DIAGNOSIS — Z1231 Encounter for screening mammogram for malignant neoplasm of breast: Secondary | ICD-10-CM

## 2022-09-07 NOTE — Telephone Encounter (Signed)
Reviewed results with patient who verbalized understanding.   Confirmed with the patient she stopped Xarelto as directed and is no longer Toprol. Her BP has been good and her HR well controlled in the 70s and 80s- she has not taken the Toprol "in a while."  Instructed her to monitor her BP and HR and call if either becomes elevated. She was grateful for assistance.  Med list updated.

## 2022-09-22 ENCOUNTER — Ambulatory Visit: Payer: Self-pay | Admitting: Plastic Surgery

## 2022-09-22 ENCOUNTER — Encounter: Payer: Self-pay | Admitting: Plastic Surgery

## 2022-09-22 DIAGNOSIS — Z719 Counseling, unspecified: Secondary | ICD-10-CM

## 2022-09-22 DIAGNOSIS — Z95818 Presence of other cardiac implants and grafts: Secondary | ICD-10-CM

## 2022-09-22 NOTE — Progress Notes (Signed)
   Subjective:    Patient ID: [name] [name], female    DOB: 1945-06-23, 77 y.o.   MRN: [id number]  The patient is a very pleasant 77 year old female.  She is here for discussion about her face.  She is interested in facial rejuvenation.  She has had a facelift approximately 27 years ago.  She has used Botox and filler.  She has not had filler in about 2-1/2 to 3 years.  She would like to see what options are available for the lowest cost.  She does not particularly want to do another facelift.  I think that is probably a good idea since she is on a blood thinner Plavix and Plaquenil.  She has loss of the midface with jowling and increase fullness in the nasolabial folds.  She says she often has redness in the face when she does not wear foundation so she may have some rosacea as well.      Review of Systems  Constitutional: Negative.   Eyes: Negative.   Respiratory: Negative.    Cardiovascular: Negative.   Gastrointestinal: Negative.   Genitourinary: Negative.        Objective:   Physical Exam Constitutional:      Appearance: Normal appearance.  Cardiovascular:     Rate and Rhythm: Normal rate.  Pulmonary:     Effort: Pulmonary effort is normal.  Skin:    General: Skin is warm.  Neurological:     Mental Status: She is alert and oriented to person, place, and time.  Psychiatric:        Mood and Affect: Mood normal.        Behavior: Behavior normal.        Thought Content: Thought content normal.        Judgment: Judgment normal.        Assessment & Plan:     ICD-10-CM   1. Presence of Watchman left atrial appendage closure device  Z95.818     2. Encounter for counseling  Z71.9         Patient is interested in laser.  I will send her information for the Moxi, halo and BBL.  Pictures were obtained of the patient and placed in the chart with the patient's or guardian's permission.

## 2022-09-27 ENCOUNTER — Encounter: Payer: Self-pay | Admitting: Cardiology

## 2022-09-28 ENCOUNTER — Emergency Department (HOSPITAL_BASED_OUTPATIENT_CLINIC_OR_DEPARTMENT_OTHER): Payer: Medicare Other

## 2022-09-28 ENCOUNTER — Emergency Department (HOSPITAL_BASED_OUTPATIENT_CLINIC_OR_DEPARTMENT_OTHER)
Admission: EM | Admit: 2022-09-28 | Discharge: 2022-09-28 | Disposition: A | Discharge status: Home / Self Care | Payer: Medicare Other

## 2022-09-28 ENCOUNTER — Other Ambulatory Visit: Payer: Self-pay

## 2022-09-28 ENCOUNTER — Encounter (HOSPITAL_BASED_OUTPATIENT_CLINIC_OR_DEPARTMENT_OTHER): Payer: Self-pay | Admitting: Emergency Medicine

## 2022-09-28 DIAGNOSIS — S01511A Laceration without foreign body of lip, initial encounter: Secondary | ICD-10-CM | POA: Insufficient documentation

## 2022-09-28 DIAGNOSIS — W010XXA Fall on same level from slipping, tripping and stumbling without subsequent striking against object, initial encounter: Secondary | ICD-10-CM | POA: Diagnosis not present

## 2022-09-28 DIAGNOSIS — Z7902 Long term (current) use of antithrombotics/antiplatelets: Secondary | ICD-10-CM | POA: Diagnosis not present

## 2022-09-28 DIAGNOSIS — S0181XA Laceration without foreign body of other part of head, initial encounter: Secondary | ICD-10-CM | POA: Diagnosis present

## 2022-09-28 MED ORDER — LIDOCAINE-EPINEPHRINE (PF) 2 %-1:200000 IJ SOLN
10.0000 mL | Freq: Once | INTRAMUSCULAR | Status: AC
  Administered 2022-09-28: 10 mL
  Filled 2022-09-28: qty 20

## 2022-09-28 NOTE — ED Provider Notes (Signed)
[facility] [facility] Provider Note   CSN: [id number] Arrival date & time: 09/28/22  4098     History  Chief Complaint  Patient presents with   Facial Injury    [name] [name] is a 77 y.o. female.  77 year old female presenting emergency department after mechanical fall.  Lost balance fell into bushes.  Has laceration to her left lower chin.  She is on Plavix.  Does not think she hit her head.  No LOC.  Denies any pain other than to lip.  Reports tetanus updated 3 years ago   Facial Injury      Home Medications Prior to Admission medications   Medication Sig Start Date End Date Taking? Authorizing Provider  acetaminophen (TYLENOL) 500 MG tablet Take 1,000 mg by mouth 2 (two) times daily.    [provider]  Cholecalciferol (VITAMIN D3) 50 MCG (2000 UT) capsule Take 2,000 Units by mouth daily.    [provider]  citalopram (CELEXA) 20 MG tablet Take 20 mg by mouth in the morning. 10/26/14   [provider]  clopidogrel (PLAVIX) 75 MG tablet Take 1 tablet (75 mg total) by mouth daily. START ON 08/10/22 08/03/22   Georgie Chard D, NP  cyanocobalamin (VITAMIN B12) 1000 MCG tablet Take 1,000 mcg by mouth daily.    [provider]  hydroxychloroquine (PLAQUENIL) 200 MG tablet Take 200 mg by mouth in the morning. 05/09/19   [provider]      Allergies    Patient has no known allergies.    Review of Systems   Review of Systems  Physical Exam Updated Vital Signs BP (!) 166/101   Pulse 78   Temp 97.9 F (36.6 C) (Oral)   Resp (!) 21   Wt 66.2 kg   SpO2 98%   BMI 22.20 kg/m  Physical Exam Vitals and nursing note reviewed.  Constitutional:      General: She is not in acute distress. HENT:     Head: Normocephalic.     Comments: Large 3 cm laceration to left lower lip crossing vermilion border.  Does not invade oral cavity.    Mouth/Throat:     Mouth: Mucous membranes are moist.   Eyes:     Conjunctiva/sclera: Conjunctivae normal.  Cardiovascular:     Rate and Rhythm: Normal rate and regular rhythm.  Pulmonary:     Effort: Pulmonary effort is normal.  Abdominal:     General: Abdomen is flat. There is no distension.  Musculoskeletal:        General: No tenderness. Normal range of motion.  Skin:    General: Skin is warm.  Neurological:     General: No focal deficit present.     Mental Status: She is alert.  Psychiatric:        Mood and Affect: Mood normal.        Behavior: Behavior normal.     ED Results / Procedures / Treatments   Labs (all labs ordered are listed, but only abnormal results are displayed) Labs Reviewed - No data to display  EKG None  Radiology CT Head Wo Contrast  Result Date: 09/28/2022 CLINICAL DATA:  Fall, facial laceration EXAM: CT HEAD WITHOUT CONTRAST CT MAXILLOFACIAL WITHOUT CONTRAST CT CERVICAL SPINE WITHOUT CONTRAST TECHNIQUE: Multidetector CT imaging of the head, cervical spine, and maxillofacial structures were performed using the standard protocol without intravenous contrast. Multiplanar CT image reconstructions of the cervical spine and maxillofacial structures were also generated.  RADIATION DOSE REDUCTION: This exam was performed according to the departmental dose-optimization program which includes automated exposure control, adjustment of the mA and/or kV according to patient size and/or use of iterative reconstruction technique. COMPARISON:  02/02/2020 FINDINGS: CT HEAD FINDINGS Brain: No evidence of acute infarction, hemorrhage, hydrocephalus, extra-axial collection or mass lesion/mass effect. Periventricular and deep white matter hypodensity. Vascular: No hyperdense vessel or unexpected calcification. CT FACIAL BONES FINDINGS Skull: Hyperostosis frontalis. Negative for fracture or focal lesion. Facial bones: No displaced fractures or dislocations. Sinuses/Orbits: No acute finding. Other: Soft tissue contusion of the cheeks  and forehead. CT CERVICAL SPINE FINDINGS Alignment: Normal. Skull base and vertebrae: Osteopenia. No acute fracture. No primary bone lesion or focal pathologic process. Soft tissues and spinal canal: No prevertebral fluid or swelling. No visible canal hematoma. Disc levels: Moderate multilevel disc space height loss and osteophytosis throughout the cervical spine. Upper chest: Negative. Other: None. IMPRESSION: 1. No acute intracranial pathology. Small-vessel white matter disease. 2. No displaced fractures or dislocations of the facial bones. 3. Soft tissue contusion of the cheeks and forehead. 4. No fracture or static subluxation of the cervical spine. 5. Moderate multilevel disc degenerative disease throughout the cervical spine. Electronically Signed   By: Jearld Lesch M.D.   On: 09/28/2022 10:11   CT Cervical Spine Wo Contrast  Result Date: 09/28/2022 CLINICAL DATA:  Fall, facial laceration EXAM: CT HEAD WITHOUT CONTRAST CT MAXILLOFACIAL WITHOUT CONTRAST CT CERVICAL SPINE WITHOUT CONTRAST TECHNIQUE: Multidetector CT imaging of the head, cervical spine, and maxillofacial structures were performed using the standard protocol without intravenous contrast. Multiplanar CT image reconstructions of the cervical spine and maxillofacial structures were also generated. RADIATION DOSE REDUCTION: This exam was performed according to the departmental dose-optimization program which includes automated exposure control, adjustment of the mA and/or kV according to patient size and/or use of iterative reconstruction technique. COMPARISON:  02/02/2020 FINDINGS: CT HEAD FINDINGS Brain: No evidence of acute infarction, hemorrhage, hydrocephalus, extra-axial collection or mass lesion/mass effect. Periventricular and deep white matter hypodensity. Vascular: No hyperdense vessel or unexpected calcification. CT FACIAL BONES FINDINGS Skull: Hyperostosis frontalis. Negative for fracture or focal lesion. Facial bones: No displaced  fractures or dislocations. Sinuses/Orbits: No acute finding. Other: Soft tissue contusion of the cheeks and forehead. CT CERVICAL SPINE FINDINGS Alignment: Normal. Skull base and vertebrae: Osteopenia. No acute fracture. No primary bone lesion or focal pathologic process. Soft tissues and spinal canal: No prevertebral fluid or swelling. No visible canal hematoma. Disc levels: Moderate multilevel disc space height loss and osteophytosis throughout the cervical spine. Upper chest: Negative. Other: None. IMPRESSION: 1. No acute intracranial pathology. Small-vessel white matter disease. 2. No displaced fractures or dislocations of the facial bones. 3. Soft tissue contusion of the cheeks and forehead. 4. No fracture or static subluxation of the cervical spine. 5. Moderate multilevel disc degenerative disease throughout the cervical spine. Electronically Signed   By: Jearld Lesch M.D.   On: 09/28/2022 10:11   CT Maxillofacial Wo Contrast  Result Date: 09/28/2022 CLINICAL DATA:  Fall, facial laceration EXAM: CT HEAD WITHOUT CONTRAST CT MAXILLOFACIAL WITHOUT CONTRAST CT CERVICAL SPINE WITHOUT CONTRAST TECHNIQUE: Multidetector CT imaging of the head, cervical spine, and maxillofacial structures were performed using the standard protocol without intravenous contrast. Multiplanar CT image reconstructions of the cervical spine and maxillofacial structures were also generated. RADIATION DOSE REDUCTION: This exam was performed according to the departmental dose-optimization program which includes automated exposure control, adjustment of the mA and/or kV according to patient size and/or  use of iterative reconstruction technique. COMPARISON:  02/02/2020 FINDINGS: CT HEAD FINDINGS Brain: No evidence of acute infarction, hemorrhage, hydrocephalus, extra-axial collection or mass lesion/mass effect. Periventricular and deep white matter hypodensity. Vascular: No hyperdense vessel or unexpected calcification. CT FACIAL BONES  FINDINGS Skull: Hyperostosis frontalis. Negative for fracture or focal lesion. Facial bones: No displaced fractures or dislocations. Sinuses/Orbits: No acute finding. Other: Soft tissue contusion of the cheeks and forehead. CT CERVICAL SPINE FINDINGS Alignment: Normal. Skull base and vertebrae: Osteopenia. No acute fracture. No primary bone lesion or focal pathologic process. Soft tissues and spinal canal: No prevertebral fluid or swelling. No visible canal hematoma. Disc levels: Moderate multilevel disc space height loss and osteophytosis throughout the cervical spine. Upper chest: Negative. Other: None. IMPRESSION: 1. No acute intracranial pathology. Small-vessel white matter disease. 2. No displaced fractures or dislocations of the facial bones. 3. Soft tissue contusion of the cheeks and forehead. 4. No fracture or static subluxation of the cervical spine. 5. Moderate multilevel disc degenerative disease throughout the cervical spine. Electronically Signed   By: Jearld Lesch M.D.   On: 09/28/2022 10:11    Procedures .Marland KitchenLaceration Repair  Date/Time: 09/28/2022 10:34 AM  Performed by: Coral Spikes, DO Authorized by: Coral Spikes, DO   Consent:    Consent obtained:  Verbal   Consent given by:  Patient   Risks discussed:  Infection, pain, poor cosmetic result and retained foreign body   Alternatives discussed:  No treatment Anesthesia:    Anesthesia method:  Local infiltration   Local anesthetic:  Lidocaine 2% WITH epi Laceration details:    Location:  Lip   Lip location:  Lower exterior lip   Length (cm):  3 Pre-procedure details:    Preparation:  Patient was prepped and draped in usual sterile fashion Exploration:    Hemostasis achieved with:  Direct pressure and epinephrine   Imaging outcome: foreign body not noted     Wound extent: no foreign body     Contaminated: no   Treatment:    Area cleansed with:  Saline   Amount of cleaning:  Standard Skin repair:    Repair method:   Sutures   Suture size:  6-0   Suture material:  Prolene   Suture technique:  Simple interrupted   Number of sutures:  6 Approximation:    Approximation:  Close   Vermilion border well-aligned: yes   Repair type:    Repair type:  Simple Post-procedure details:    Dressing:  Open (no dressing)   Procedure completion:  Tolerated     Medications Ordered in ED Medications  lidocaine-EPINEPHrine (XYLOCAINE W/EPI) 2 %-1:200000 (PF) injection 10 mL (10 mLs Infiltration Given by Other 09/28/22 0917)    ED Course/ Medical Decision Making/ A&P Clinical Course as of 09/28/22 1036  Mon Sep 28, 2022  1032 CT Head Wo Contrast IMPRESSION: 1. No acute intracranial pathology. Small-vessel white matter disease. 2. No displaced fractures or dislocations of the facial bones. 3. Soft tissue contusion of the cheeks and forehead. 4. No fracture or static subluxation of the cervical spine. 5. Moderate multilevel disc degenerative disease throughout the cervical spine.   [TY]    Clinical Course User Index [TY] Coral Spikes, DO                                 Medical Decision Making Is a 77 year old female present emergency department after mechanical fall  with a lip laceration to her lower left lip.  She is afebrile vital signs reassuring.  She is on Plavix.  Given age and fall CT images obtained.  No acute pathology identified.  Considered labs, however fall was mechanical in nature and patient is asymptomatic currently.  Labs unlikely to change management or disposition.  She has large laceration to her left lower lip which was sutured with 6 sutures.  Patient has been updated on tetanus several years ago, no need to update.  Patient to return for suture removal in 6 days.  Amount and/or Complexity of Data Reviewed Radiology: ordered. Decision-making details documented in ED Course.  Risk Prescription drug management.           Final Clinical Impression(s) / ED Diagnoses Final  diagnoses:  None    Rx / DC Orders ED Discharge Orders     None         Coral Spikes, DO 09/28/22 1036

## 2022-09-28 NOTE — ED Triage Notes (Signed)
Fell 30 minutes ago , tripped landed face down on ground , left chin laceration . On plavix .  Large laceration noted , bleeding controlled .  Denis loc . No neck pain . Alert and oriented x 4 , ambulatory to treatment room with steady gait .

## 2022-09-28 NOTE — ED Notes (Signed)
EDP Dr. Maple Hudson suturing at [facility]. Husband at [facility].

## 2022-09-28 NOTE — Discharge Instructions (Addendum)
You may take over-the-counter Tylenol or Motrin for pain.  Turn immediately felt fevers, chills, sudden onset headache, vision changes, facial droop, unilateral weakness, the area around your laceration starts becoming red or starts draining pus or if you develop any new or worsening symptoms that are concerning to you.  Please return to the emergency department or any urgent care in 1 week for suture removal.  Follow-up with primary doctor soon as possible.

## 2022-10-18 ENCOUNTER — Encounter: Payer: Self-pay | Admitting: Cardiology

## 2022-12-22 NOTE — Progress Notes (Unsigned)
[facility]                                     Cardiology Office Note:    Date:  12/23/2022   ID:  [name] [name], DOB Dec 17, 1945, MRN [id number]  PCP:  Georgianne Fick, MD  [facility] Cardiologist:  Peter Swaziland, MD  [facility] Electrophysiologist:  Lanier Prude, MD   Referring MD: Georgianne Fick, MD   Chief Complaint  Patient presents with   6 month s/p LAAO    History of Present Illness:    [name] [name] is a 77 y.o. female with a hx of anxiety, RA, GERD, elevated coronary calcium score on CT, and PAF who underwent LAAO closure with a 31mm Watchman FLX Pro and is here today for 6 month follow up.    [name] is followed by Dr. Swaziland for her cardiology care. She has a low AF burden however she was interested in stroke risk mitigation to limited exposure to anticoagulation given the need to medication with NSAIDs for arthritic pain. In consultation she was felt to be a good candidate to proceed and is now s/p LAAO closure with 31mm Watchman FLX Pro device on 06/25/22. 60 day post implant CT 08/26/22 showed stable device with no leak or thrombus. There was evidence of coronary calcifications therefore plan for transition to ASA 81mg .   Today she is here and reports she has been very well with no complaints of chest pain, SOB, palpitations, LE edema, orthopnea, PND, dizziness, or syncope. Denies bleeding in stool or urine. She has completed 6 months of post Watchman therapy and can now stop Plavix. As above, with coronary calcifications, will have her start ASA 81mg  daily. She reports that she has   Past Medical History:  Diagnosis Date   Anxiety disorder    Arthritis    Depression    Flu    01/2017    GERD (gastroesophageal reflux disease)    Panic attack    panic attacks    Presence of Watchman left atrial appendage closure device 06/25/2022   Watchman FLX Pro 31mm placed by Dr. Lalla Brothers   RA (rheumatoid arthritis) [facility])     Urinary tract infection    hx of 02/2016    Vitamin D deficiency     Past Surgical History:  Procedure Laterality Date   ABDOMINAL HYSTERECTOMY     ANTERIOR AND POSTERIOR REPAIR WITH SACROSPINOUS FIXATION N/A 12/13/2014   Procedure: ANTERIOR AND POSTERIOR REPAIR WITH SACROSPINOUS LIGAMENT SUSPENSION;  Surgeon: Harold Hedge, MD;  Location: [facility];  Service: Gynecology;  Laterality: N/A;   bladder tack surgery      BRONCHOSCOPY     COLONOSCOPY     LEFT ATRIAL APPENDAGE OCCLUSION N/A 06/25/2022   Procedure: LEFT ATRIAL APPENDAGE OCCLUSION;  Surgeon: Lanier Prude, MD;  Location: [facility] INVASIVE CV LAB;  Service: Cardiovascular;  Laterality: N/A;   TEE WITHOUT CARDIOVERSION N/A 06/25/2022   Procedure: TRANSESOPHAGEAL ECHOCARDIOGRAM;  Surgeon: Lanier Prude, MD;  Location: [facility] INVASIVE CV LAB;  Service: Cardiovascular;  Laterality: N/A;   TOTAL KNEE ARTHROPLASTY Left 04/07/2016   Procedure: LEFT  TOTAL KNEE ARTHROPLASTY;  Surgeon: Durene Romans, MD;  Location: [facility];  Service: Orthopedics;  Laterality: Left;    Current Medications: Current Meds  Medication Sig   acetaminophen (TYLENOL) 500 MG tablet Take 1,000 mg by mouth 2 (two) times daily.  Cholecalciferol (VITAMIN D3) 50 MCG (2000 UT) capsule Take 2,000 Units by mouth daily.   citalopram (CELEXA) 20 MG tablet Take 20 mg by mouth in the morning.   clopidogrel (PLAVIX) 75 MG tablet Take 1 tablet (75 mg total) by mouth daily. START ON 08/10/22   cyanocobalamin (VITAMIN B12) 1000 MCG tablet Take 1,000 mcg by mouth daily.   [DISCONTINUED] hydroxychloroquine (PLAQUENIL) 200 MG tablet Take 200 mg by mouth in the morning.     Allergies:   Patient has no known allergies.   Social History   Socioeconomic History   Marital status: Married    Spouse name: Not on file   Number of children: Not on file   Years of education: Not on file   Highest education level: Not on file  Occupational History   Not on file  Tobacco Use   Smoking  status: Former   Smokeless tobacco: Never  Vaping Use   Vaping status: Never Used  Substance and Sexual Activity   Alcohol use: Yes    Comment: 2-3 glasses of wine some days "I probably drink  more than I should"   Drug use: No   Sexual activity: Yes    Birth control/protection: Post-menopausal  Other Topics Concern   Not on file  Social History Narrative   Not on file   Social Determinants of Health   Financial Resource Strain: Not on file  Food Insecurity: Not on file  Transportation Needs: Not on file  Physical Activity: Not on file  Stress: Not on file  Social Connections: Not on file    Family History: The patient's family history includes Breast cancer in her maternal aunt and sister; Melanoma in her niece; Other in her mother.  ROS:   Please see the history of present illness.    All other systems reviewed and are negative.  EKGs/Labs/Other Studies Reviewed:    The following studies were reviewed today:   Cardiac Studies & Procedures     STRESS TESTS  MYOCARDIAL PERFUSION IMAGING 04/20/2019  Narrative  Lexiscan stress EKG nondiagnostic due to baseline changes  Myovue scan with normal perfusion No ischemia  LVEF 64%  This is a low risk study.   ECHOCARDIOGRAM  ECHOCARDIOGRAM COMPLETE 03/19/2022  Narrative ECHOCARDIOGRAM REPORT    Patient Name:   [name] [name] Date of Exam: 03/19/2022 Medical Rec #:  [id number]             Height:       68.0 in Accession #:    [id number]            Weight:       161.0 lb Date of Birth:  1945-07-05            BSA:          1.864 m Patient Age:    79 years              BP:           132/68 mmHg Patient Gender: F                     HR:           83 bpm. Exam Location:  [address]  Procedure: 2D Echo, Cardiac Doppler and Color Doppler  Indications:    I48.91 Atrial fibrillation  History:        Patient has prior history of Echocardiogram examinations, most recent 10/12/2014. CAD, Arrythmias:Atrial  Fibrillation and RBBB; Risk Factors:Dyslipidemia.  Sonographer:    Samule Ohm RDCS Referring Phys: [id number] Rossie Muskrat LAMBERT  IMPRESSIONS   1. Left ventricular ejection fraction, by estimation, is 65 to 70%. The left ventricle has normal function. The left ventricle has no regional wall motion abnormalities. There is moderate hypertrophy of the basal septal segment. The rest of the LV segments demonstrate mild left ventricular hypertrophy. Left ventricular diastolic parameters are consistent with Grade I diastolic dysfunction (impaired relaxation). 2. Right ventricular systolic function is normal. The right ventricular size is normal. 3. The mitral valve is normal in structure. Trivial mitral valve regurgitation. 4. The aortic valve is tricuspid. Aortic valve regurgitation is not visualized. No aortic stenosis is present. 5. The inferior vena cava is normal in size with greater than 50% respiratory variability, suggesting right atrial pressure of 3 mmHg.  FINDINGS Left Ventricle: Left ventricular ejection fraction, by estimation, is 65 to 70%. The left ventricle has normal function. The left ventricle has no regional wall motion abnormalities. The left ventricular internal cavity size was normal in size. There is moderate hypertrophy of the basal septal segment. The rest of the LV segments demonstrate mild left ventricular hypertrophy. Left ventricular diastolic parameters are consistent with Grade I diastolic dysfunction (impaired relaxation).  Right Ventricle: The right ventricular size is normal. No increase in right ventricular wall thickness. Right ventricular systolic function is normal.  Left Atrium: Left atrial size was normal in size.  Right Atrium: Right atrial size was normal in size.  Pericardium: There is no evidence of pericardial effusion.  Mitral Valve: The mitral valve is normal in structure. Trivial mitral valve regurgitation.  Tricuspid Valve: The tricuspid valve  is normal in structure. Tricuspid valve regurgitation is not demonstrated.  Aortic Valve: The aortic valve is tricuspid. Aortic valve regurgitation is not visualized. No aortic stenosis is present.  Pulmonic Valve: The pulmonic valve was normal in structure. Pulmonic valve regurgitation is trivial.  Aorta: The aortic root and ascending aorta are structurally normal, with no evidence of dilitation.  Venous: The inferior vena cava is normal in size with greater than 50% respiratory variability, suggesting right atrial pressure of 3 mmHg.  IAS/Shunts: The atrial septum is grossly normal.   LEFT VENTRICLE PLAX 2D LVIDd:         4.10 cm   Diastology LVIDs:         2.70 cm   LV e' medial:    6.64 cm/s LV PW:         1.20 cm   LV E/e' medial:  8.9 LV IVS:        1.60 cm   LV e' lateral:   5.98 cm/s LVOT diam:     1.90 cm   LV E/e' lateral: 9.9 LV SV:         54 LV SV Index:   29 LVOT Area:     2.84 cm   RIGHT VENTRICLE         IVC TAPSE (M-mode): 1.2 cm  IVC diam: 0.90 cm  LEFT ATRIUM             Index        RIGHT ATRIUM           Index LA diam:        4.10 cm 2.20 cm/m   RA Pressure: 3.00 mmHg LA Vol (A2C):   59.7 ml 32.03 ml/m  RA Area:     9.68 cm LA Vol (A4C):   44.4 ml 23.82 ml/m  RA  Volume:   19.50 ml  10.46 ml/m LA Biplane Vol: 54.9 ml 29.46 ml/m AORTIC VALVE LVOT Vmax:   103.00 cm/s LVOT Vmean:  61.700 cm/s LVOT VTI:    0.190 m  AORTA Ao Root diam: 3.60 cm Ao Asc diam:  3.50 cm  MITRAL VALVE               TRICUSPID VALVE MV Area (PHT): 3.21 cm    Estimated RAP:  3.00 mmHg MV Decel Time: 236 msec MV E velocity: 59.20 cm/s  SHUNTS MV A velocity: 93.80 cm/s  Systemic VTI:  0.19 m MV E/A ratio:  0.63        Systemic Diam: 1.90 cm  Laurance Flatten MD Electronically signed by Laurance Flatten MD Signature Date/Time: 03/19/2022/2:50:21 PM    Final   TEE  ECHO TEE 06/25/2022  Narrative TRANSESOPHOGEAL ECHO REPORT    Patient Name:   [name] [name] Date of Exam: 06/25/2022 Medical Rec #:  [id number]             Height:       68.0 in Accession #:    [id number]            Weight:       153.0 lb Date of Birth:  Jul 13, 1945            BSA:          1.824 m Patient Age:    76 years              BP:           152/84 mmHg Patient Gender: F                     HR:           71 bpm. Exam Location:  Inpatient  Procedure: 2D Echo, 3D Echo, Cardiac Doppler and Color Doppler  Indications:     Watchman placement  History:         Patient has prior history of Echocardiogram examinations, most recent 03/19/2022. Abnormal ECG, Arrythmias:Atrial Fibrillation, Signs/Symptoms:Dizziness/Lightheadedness and Chest Pain; Risk Factors:Dyslipidemia. ETOH abuse,.  Sonographer:     Milbert Coulter Referring Phys:  [id number] Lanier Prude Diagnosing Phys: Charlton Haws MD  PROCEDURE: After discussion of the risks and benefits of a TEE, an informed consent was obtained from the patient. The patient was intubated. The transesophogeal probe was passed without difficulty through the esophogus of the patient. Imaged were obtained with the patient in a supine position. Sedation performed by different physician. The patient was monitored while under deep sedation. Anesthestetic sedation was provided intravenously by Anesthesiology: 150mg  of Propofol, 60mg  of Lidocaine. Image quality was good. The patient's vital signs; including heart rate, blood pressure, and oxygen saturation; remained stable throughout the procedure. The patient developed no complications during the procedure.  IMPRESSIONS   1. Post trans septal left to right shunting. 2. Well placed 31 mm FLX device. Negative tug test No leak by color flow Average compression 19.4%. 3. Left ventricular ejection fraction, by estimation, is 60 to 65%. The left ventricle has normal function. The left ventricle has no regional wall motion abnormalities. There is moderate left ventricular hypertrophy. 4. Right  ventricular systolic function is normal. The right ventricular size is normal. 5. No left atrial/left atrial appendage thrombus was detected. 6. The mitral valve is abnormal. Trivial mitral valve regurgitation. No evidence of mitral stenosis. 7. The aortic valve is tricuspid. There is mild calcification  of the aortic valve. There is mild thickening of the aortic valve. Aortic valve regurgitation is not visualized. Aortic valve sclerosis is present, with no evidence of aortic valve stenosis. 8. The inferior vena cava is normal in size with greater than 50% respiratory variability, suggesting right atrial pressure of 3 mmHg. 9. Agitated saline contrast bubble study was positive with shunting observed within 3-6 cardiac cycles suggestive of interatrial shunt.  Conclusion(s)/Recommendation(s): Normal biventricular function without evidence of hemodynamically significant valvular heart disease.  FINDINGS Left Ventricle: Left ventricular ejection fraction, by estimation, is 60 to 65%. The left ventricle has normal function. The left ventricle has no regional wall motion abnormalities. The left ventricular internal cavity size was normal in size. There is moderate left ventricular hypertrophy.  Right Ventricle: The right ventricular size is normal. No increase in right ventricular wall thickness. Right ventricular systolic function is normal.  Left Atrium: Left atrial size was normal in size. No left atrial/left atrial appendage thrombus was detected.  Right Atrium: Right atrial size was normal in size.  Pericardium: There is no evidence of pericardial effusion.  Mitral Valve: The mitral valve is abnormal. There is mild thickening of the mitral valve leaflet(s). Trivial mitral valve regurgitation. No evidence of mitral valve stenosis.  Tricuspid Valve: The tricuspid valve is normal in structure. Tricuspid valve regurgitation is mild . No evidence of tricuspid stenosis.  Aortic Valve: The aortic  valve is tricuspid. There is mild calcification of the aortic valve. There is mild thickening of the aortic valve. Aortic valve regurgitation is not visualized. Aortic valve sclerosis is present, with no evidence of aortic valve stenosis.  Pulmonic Valve: The pulmonic valve was normal in structure. Pulmonic valve regurgitation is not visualized. No evidence of pulmonic stenosis.  Aorta: The aortic root is normal in size and structure.  Venous: The inferior vena cava is normal in size with greater than 50% respiratory variability, suggesting right atrial pressure of 3 mmHg.  IAS/Shunts: No atrial level shunt detected by color flow Doppler. Agitated saline contrast bubble study was positive with shunting observed within 3-6 cardiac cycles suggestive of interatrial shunt.  Additional Comments: Well placed 31 mm FLX device. Negative tug test No leak by color flow Average compression 19.4%. Post trans septal left to right shunting. Spectral Doppler performed.  Charlton Haws MD Electronically signed by Charlton Haws MD Signature Date/Time: 06/25/2022/12:59:03 PM    Final            EKG:  EKG is not ordered today.    Recent Labs: 06/12/2022: Hemoglobin 14.2; Platelets 195 08/03/2022: BUN 17; Creatinine, Ser 0.73; Potassium 4.6; Sodium 139   Recent Lipid Panel    Component Value Date/Time   CHOL 209 (H) 10/05/2014 0217   TRIG 42 10/05/2014 0217   HDL 76 10/05/2014 0217   CHOLHDL 2.8 10/05/2014 0217   VLDL 8 10/05/2014 0217   LDLCALC 125 (H) 10/05/2014 0217   Risk Assessment/Calculations:    CHA2DS2-VASc Score = 4  The patient's score is based upon: CHF History: 0 HTN History: 0 Diabetes History: 0 Stroke History: 0 Vascular Disease History: 1 Age Score: 2 Gender Score: 1  Physical Exam:    VS:  BP 120/80   Pulse 84   Ht 5\' 8"  (1.727 m)   Wt 148 lb 3.2 oz (67.2 kg)   SpO2 99%   BMI 22.53 kg/m     Wt Readings from Last 3 Encounters:  12/23/22 148 lb 3.2 oz (67.2 kg)   09/28/22 146 lb (  66.2 kg)  08/03/22 153 lb 9.6 oz (69.7 kg)    General: Well developed, well nourished, NAD Lungs:Clear to ausculation bilaterally. No wheezes, rales, or rhonchi. Breathing is unlabored. Cardiovascular: RRR with S1 S2. No murmurs Extremities: No edema.  Neuro: Alert and oriented. No focal deficits. No facial asymmetry. MAE spontaneously. Psych: Responds to questions appropriately with normal affect.    ASSESSMENT/PLAN:    Paroxsymal atrial fibrillation: s/p LAAO closure with 31mm Watchman FLX Pro device on 5/9. She is now 6 months post implant and no longer requires Plavix. She will stop today and start ASA 81mg  tomorrow given coronary calcifications on CT imaging. Additionally she no longer requires dental SBE. Plan regular follow up with Dr. Swaziland 03/2023 and our team will touch base at her one year LAAO.     Medication Adjustments/Labs and Tests Ordered: Current medicines are reviewed at length with the patient today.  Concerns regarding medicines are outlined above.  No orders of the defined types were placed in this encounter.  No orders of the defined types were placed in this encounter.   There are no Patient Instructions on file for this visit.   Signed, Georgie Chard, NP  12/23/2022 9:56 AM    [facility]

## 2022-12-23 ENCOUNTER — Ambulatory Visit: Payer: Medicare Other | Attending: Cardiology | Admitting: Cardiology

## 2022-12-23 VITALS — BP 120/80 | HR 84 | Ht 68.0 in | Wt 148.2 lb

## 2022-12-23 DIAGNOSIS — I480 Paroxysmal atrial fibrillation: Secondary | ICD-10-CM

## 2022-12-23 DIAGNOSIS — I2510 Atherosclerotic heart disease of native coronary artery without angina pectoris: Secondary | ICD-10-CM | POA: Diagnosis not present

## 2022-12-23 DIAGNOSIS — Z95818 Presence of other cardiac implants and grafts: Secondary | ICD-10-CM | POA: Diagnosis not present

## 2022-12-23 MED ORDER — ASPIRIN 81 MG PO TBEC: 81.0000 mg | DELAYED_RELEASE_TABLET | Freq: Every day | ORAL | Status: AC

## 2022-12-23 NOTE — Patient Instructions (Signed)
Medication Instructions:  Your physician has recommended you make the following change in your medication:  1) STOP taking Plavix (clopidogrel) 2) START taking Aspirin 81 mg daily *If you need a refill on your cardiac medications before your next appointment, please call your pharmacy*   Follow-Up: At [facility], you and your health needs are our priority.  As part of our continuing mission to provide you with exceptional heart care, we have created designated Provider Care Teams.  These Care Teams include your primary Cardiologist (physician) and Advanced Practice Providers (APPs -  Physician Assistants and Nurse Practitioners) who all work together to provide you with the care you need, when you need it.  Your next appointment:   February 2025  Provider:   Peter Swaziland, MD

## 2023-03-19 NOTE — Progress Notes (Signed)
 Cardiology Office Note   Date:  03/23/2023   ID:  [name] [name] 02-13-1946, MRN [id number]  PCP:  Verdia Lombard, MD  Cardiologist:  Geoffrey Mankin, MD EP: OLE ONEIDA HOLTS, MD  Chief Complaint  Patient presents with   Atrial Fibrillation      History of Present Illness: [name] [name] is a 78 y.o. female with PMH of paroxysmal atrial fibrillation, coronary artery calcifications on CT, anxiety/panic attacks, RA, and GERD, who is seen for follow-up.   She was last evaluated  04/13/19 to evaluate coronary artery calcifications seen on CT Chest during a recent admission for COVID-19 PNA 02/2019. She continued to have SOB following her COVID infection. She was recommended to undergo a NST which showed EF 64% and no evidence of ischemia. Aggressive risk factor modifications were encouraged. Her last echocardiogram in 02/2019 which showed EF  60-65%, mild LVH, no significant valvular abnormalities, and normal LAE.   She presented to [facility] ED 07/03/19 with complaints of rapid palpitations with associated chest tightness and SOB. Initial EKG on arrival showed atrial fibrillation with RVR. She spontaneously converted to NSR. She was started on xarelto  for stroke ppx and metoprolol  succinate for rate control.   She does have a low Afib burden. She was felt to be a good candidate to proceed and is now s/p LAAO closure with 31mm Watchman FLX Pro device on 06/25/22.   On follow up today she is doing well. Denies any chest pain or dyspnea. Not aware of Afib. Is taking ASA.     Past Medical History:  Diagnosis Date   Anxiety disorder    Arthritis    Depression    Flu    01/2017    GERD (gastroesophageal reflux disease)    Panic attack    panic attacks    Presence of Watchman left atrial appendage closure device 06/25/2022   Watchman FLX Pro 31mm placed by Dr. Holts   RA (rheumatoid arthritis) [facility])    Urinary tract infection    hx of 02/2016    Vitamin D deficiency      Past Surgical History:  Procedure Laterality Date   ABDOMINAL HYSTERECTOMY     ANTERIOR AND POSTERIOR REPAIR WITH SACROSPINOUS FIXATION N/A 12/13/2014   Procedure: ANTERIOR AND POSTERIOR REPAIR WITH SACROSPINOUS LIGAMENT SUSPENSION;  Surgeon: Lynwood Clubs, MD;  Location: [facility] ORS;  Service: Gynecology;  Laterality: N/A;   bladder tack surgery      BRONCHOSCOPY     COLONOSCOPY     LEFT ATRIAL APPENDAGE OCCLUSION N/A 06/25/2022   Procedure: LEFT ATRIAL APPENDAGE OCCLUSION;  Surgeon: Holts Ole ONEIDA, MD;  Location: [facility] INVASIVE CV LAB;  Service: Cardiovascular;  Laterality: N/A;   TEE WITHOUT CARDIOVERSION N/A 06/25/2022   Procedure: TRANSESOPHAGEAL ECHOCARDIOGRAM;  Surgeon: Holts Ole ONEIDA, MD;  Location: [facility] INVASIVE CV LAB;  Service: Cardiovascular;  Laterality: N/A;   TOTAL KNEE ARTHROPLASTY Left 04/07/2016   Procedure: LEFT  TOTAL KNEE ARTHROPLASTY;  Surgeon: Donnice Car, MD;  Location: [facility] ORS;  Service: Orthopedics;  Laterality: Left;     Current Outpatient Medications  Medication Sig Dispense Refill   acetaminophen  (TYLENOL ) 500 MG tablet Take 1,000 mg by mouth 2 (two) times daily.     aspirin  EC 81 MG tablet Take 1 tablet (81 mg total) by mouth daily. Swallow whole.     Cholecalciferol (VITAMIN D3) 50 MCG (2000 UT) capsule Take 2,000 Units by mouth daily.     citalopram  (CELEXA ) 20 MG tablet Take  20 mg by mouth in the morning.  0   cyanocobalamin (VITAMIN B12) 1000 MCG tablet Take 1,000 mcg by mouth daily.     rosuvastatin  (CRESTOR ) 5 MG tablet Take 1 tablet (5 mg total) by mouth daily. 90 tablet 3   No current facility-administered medications for this visit.    Allergies:   Patient has no known allergies.    Social History:  The patient  reports that she has quit smoking. She has never used smokeless tobacco. She reports current alcohol use. She reports that she does not use drugs.   Family History:  The patient's family history includes Breast cancer in her maternal  aunt and sister; Melanoma in her niece; Other in her mother.    ROS:  Please see the history of present illness.   Otherwise, review of systems are positive for none.   All other systems are reviewed and negative.    PHYSICAL EXAM: VS:  BP 133/88   Pulse 74   Ht 5' 8 (1.727 m)   Wt 150 lb (68 kg)   SpO2 96%   BMI 22.81 kg/m  , BMI Body mass index is 22.81 kg/m. GEN: Well nourished, well developed, in no acute distress HEENT: sclera anicteric Neck: no JVD, carotid bruits, or masses Cardiac: RRR; no murmurs, rubs, or gallops, no edema  Respiratory:  clear to auscultation bilaterally, normal work of breathing GI: soft, nontender, nondistended, + BS MS: no deformity or atrophy Skin: warm and dry, no rash Neuro:  Strength and sensation are intact Psych: euthymic mood, full affect   EKG:  EKG is not  ordered today.     Recent Labs: 06/12/2022: Hemoglobin 14.2; Platelets 195 08/03/2022: BUN 17; Creatinine, Ser 0.73; Potassium 4.6; Sodium 139    Lipid Panel    Component Value Date/Time   CHOL 209 (H) 10/05/2014 0217   TRIG 42 10/05/2014 0217   HDL 76 10/05/2014 0217   CHOLHDL 2.8 10/05/2014 0217   VLDL 8 10/05/2014 0217   LDLCALC 125 (H) 10/05/2014 0217   Dated 06/02/19: LDL 125. Triglycerides 41 Dated 07/23/20: cholesterol 171, triglycerides 70, HDL 80. nonHDL 91. LDL 77 Dated 02/26/21: BMET and CBC normal. Dated 11/02/22: cholesterol 163, triglycerides 112, HDL 57, LDL 86. BMET normal  Wt Readings from Last 3 Encounters:  03/23/23 150 lb (68 kg)  12/23/22 148 lb 3.2 oz (67.2 kg)  09/28/22 146 lb (66.2 kg)      Other studies Reviewed: Additional studies/ records that were reviewed today include:    NST 04/2019: Study Highlights  Lexiscan  stress EKG nondiagnostic due to baseline changes Myovue scan with normal perfusion No ischemia LVEF 64% This is a low risk study.   Echocardiogram 02/2019: Normal left ventricular size and systolic function with no  appreciable segmental abnormality. Ejection fraction is visually estimated at 60-65% Mild concentric left ventricular hypertrophy No significant valvular abnormalities.  Echocardiogram 2016: Study Conclusions   - Left ventricle: The cavity size was normal. There was mild focal    basal and mild concentric hypertrophy of the septum. Systolic    function was normal. The estimated ejection fraction was in the    range of 55% to 60%. Wall motion was normal; there were no    regional wall motion abnormalities. Left ventricular diastolic    function parameters were normal.  - Atrial septum: No defect or patent foramen ovale was identified.    Echo 03/19/22: IMPRESSIONS     1. Left ventricular ejection fraction, by estimation, is  65 to 70%. The  left ventricle has normal function. The left ventricle has no regional  wall motion abnormalities. There is moderate hypertrophy of the basal  septal segment. The rest of the LV  segments demonstrate mild left ventricular hypertrophy. Left ventricular  diastolic parameters are consistent with Grade I diastolic dysfunction  (impaired relaxation).   2. Right ventricular systolic function is normal. The right ventricular  size is normal.   3. The mitral valve is normal in structure. Trivial mitral valve  regurgitation.   4. The aortic valve is tricuspid. Aortic valve regurgitation is not  visualized. No aortic stenosis is present.   5. The inferior vena cava is normal in size with greater than 50%  respiratory variability, suggesting right atrial pressure of 3 mmHg.    ASSESSMENT AND PLAN:  1. Paroxysmal atrial fibrillation: infrequent.  Prior Echo normal - now s/p Watchman device placement. No longer on anticoagulation  2. Coronary artery calcifications on CT: NST 04/2019 was without ischemia. Reviewed CT showing high degree of calcification. Recommend ASA 81 mg daily. Lipid lowering therapy with goal LDL < 55  3. HLD: recommend resuming  Crestor  5 mg daily - will have follow up labs with PCP  4. RBBB. Chronic   Disposition:   FU with me in one year  Signed, Payden Bonus, MD  03/23/2023 12:06 PM

## 2023-03-23 ENCOUNTER — Ambulatory Visit: Payer: Medicare Other | Attending: Cardiology | Admitting: Cardiology

## 2023-03-23 ENCOUNTER — Encounter: Payer: Self-pay | Admitting: Cardiology

## 2023-03-23 VITALS — BP 133/88 | HR 74 | Ht 68.0 in | Wt 150.0 lb

## 2023-03-23 DIAGNOSIS — Z95818 Presence of other cardiac implants and grafts: Secondary | ICD-10-CM

## 2023-03-23 DIAGNOSIS — I2510 Atherosclerotic heart disease of native coronary artery without angina pectoris: Secondary | ICD-10-CM | POA: Diagnosis not present

## 2023-03-23 DIAGNOSIS — I480 Paroxysmal atrial fibrillation: Secondary | ICD-10-CM | POA: Diagnosis not present

## 2023-03-23 MED ORDER — ROSUVASTATIN CALCIUM 5 MG PO TABS: 5.0000 mg | ORAL_TABLET | Freq: Every day | ORAL | 3 refills | Status: AC

## 2023-03-23 NOTE — Patient Instructions (Signed)
 Medication Instructions:  Take Crestor  5 mg daily Continue all other medications *If you need a refill on your cardiac medications before your next appointment, please call your pharmacy*   Lab Work: Follow up lab with Primary Dr.   Testing/Procedures: None ordered   Follow-Up: At [facility], you and your health needs are our priority.  As part of our continuing mission to provide you with exceptional heart care, we have created designated Provider Care Teams.  These Care Teams include your primary Cardiologist (physician) and Advanced Practice Providers (APPs -  Physician Assistants and Nurse Practitioners) who all work together to provide you with the care you need, when you need it.  We recommend signing up for the patient portal called MyChart.  Sign up information is provided on this After Visit Summary.  MyChart is used to connect with patients for Virtual Visits (Telemedicine).  Patients are able to view lab/test results, encounter notes, upcoming appointments, etc.  Non-urgent messages can be sent to your provider as well.   To learn more about what you can do with MyChart, go to [email or web address].    Your next appointment:  1 year    Call in Oct to schedule Feb appointment     Provider:  Dr.Jordan

## 2023-05-07 DIAGNOSIS — M7989 Other specified soft tissue disorders: Secondary | ICD-10-CM | POA: Diagnosis not present

## 2023-05-07 DIAGNOSIS — M0579 Rheumatoid arthritis with rheumatoid factor of multiple sites without organ or systems involvement: Secondary | ICD-10-CM | POA: Diagnosis not present

## 2023-05-07 DIAGNOSIS — M25511 Pain in right shoulder: Secondary | ICD-10-CM | POA: Diagnosis not present

## 2023-05-07 DIAGNOSIS — M8580 Other specified disorders of bone density and structure, unspecified site: Secondary | ICD-10-CM | POA: Diagnosis not present

## 2023-05-07 DIAGNOSIS — M1991 Primary osteoarthritis, unspecified site: Secondary | ICD-10-CM | POA: Diagnosis not present

## 2023-05-07 DIAGNOSIS — Z6823 Body mass index (BMI) 23.0-23.9, adult: Secondary | ICD-10-CM | POA: Diagnosis not present

## 2023-05-07 DIAGNOSIS — E559 Vitamin D deficiency, unspecified: Secondary | ICD-10-CM | POA: Diagnosis not present

## 2023-06-08 DIAGNOSIS — H2513 Age-related nuclear cataract, bilateral: Secondary | ICD-10-CM | POA: Diagnosis not present

## 2023-06-08 DIAGNOSIS — H52203 Unspecified astigmatism, bilateral: Secondary | ICD-10-CM | POA: Diagnosis not present

## 2023-06-08 DIAGNOSIS — Z79899 Other long term (current) drug therapy: Secondary | ICD-10-CM | POA: Diagnosis not present

## 2023-06-08 DIAGNOSIS — M069 Rheumatoid arthritis, unspecified: Secondary | ICD-10-CM | POA: Diagnosis not present

## 2023-06-30 DIAGNOSIS — I83899 Varicose veins of unspecified lower extremities with other complications: Secondary | ICD-10-CM | POA: Diagnosis not present

## 2023-06-30 DIAGNOSIS — M79606 Pain in leg, unspecified: Secondary | ICD-10-CM | POA: Diagnosis not present

## 2023-07-13 DIAGNOSIS — M79662 Pain in left lower leg: Secondary | ICD-10-CM | POA: Diagnosis not present

## 2023-07-13 DIAGNOSIS — M79604 Pain in right leg: Secondary | ICD-10-CM | POA: Diagnosis not present

## 2023-07-13 DIAGNOSIS — M79661 Pain in right lower leg: Secondary | ICD-10-CM | POA: Diagnosis not present

## 2023-07-13 DIAGNOSIS — M79605 Pain in left leg: Secondary | ICD-10-CM | POA: Diagnosis not present

## 2023-07-13 DIAGNOSIS — I83893 Varicose veins of bilateral lower extremities with other complications: Secondary | ICD-10-CM | POA: Diagnosis not present

## 2023-07-29 ENCOUNTER — Telehealth: Payer: Self-pay

## 2023-07-29 NOTE — Telephone Encounter (Signed)
 Called to check in with patient, who had LAAO on 06/25/2022. The patient reports doing well with no issues.  She is due to see Dr. [place] early 2026 and will call if she has any issues prior to that time. She was grateful for call.

## 2023-08-16 DIAGNOSIS — R1031 Right lower quadrant pain: Secondary | ICD-10-CM | POA: Diagnosis not present

## 2023-08-16 DIAGNOSIS — M545 Low back pain, unspecified: Secondary | ICD-10-CM | POA: Diagnosis not present

## 2023-10-12 DIAGNOSIS — I4891 Unspecified atrial fibrillation: Secondary | ICD-10-CM | POA: Diagnosis not present

## 2023-10-12 DIAGNOSIS — I10 Essential (primary) hypertension: Secondary | ICD-10-CM | POA: Diagnosis not present

## 2023-10-12 DIAGNOSIS — R051 Acute cough: Secondary | ICD-10-CM | POA: Diagnosis not present

## 2023-10-12 DIAGNOSIS — R0600 Dyspnea, unspecified: Secondary | ICD-10-CM | POA: Diagnosis not present

## 2023-11-17 ENCOUNTER — Other Ambulatory Visit: Payer: Self-pay | Admitting: Internal Medicine

## 2023-11-17 DIAGNOSIS — Z1231 Encounter for screening mammogram for malignant neoplasm of breast: Secondary | ICD-10-CM

## 2023-11-19 DIAGNOSIS — Z6823 Body mass index (BMI) 23.0-23.9, adult: Secondary | ICD-10-CM | POA: Diagnosis not present

## 2023-11-19 DIAGNOSIS — M25511 Pain in right shoulder: Secondary | ICD-10-CM | POA: Diagnosis not present

## 2023-11-19 DIAGNOSIS — M0579 Rheumatoid arthritis with rheumatoid factor of multiple sites without organ or systems involvement: Secondary | ICD-10-CM | POA: Diagnosis not present

## 2023-11-19 DIAGNOSIS — M8580 Other specified disorders of bone density and structure, unspecified site: Secondary | ICD-10-CM | POA: Diagnosis not present

## 2023-11-19 DIAGNOSIS — E559 Vitamin D deficiency, unspecified: Secondary | ICD-10-CM | POA: Diagnosis not present

## 2023-11-19 DIAGNOSIS — M1991 Primary osteoarthritis, unspecified site: Secondary | ICD-10-CM | POA: Diagnosis not present

## 2023-12-09 ENCOUNTER — Ambulatory Visit
Admission: RE | Admit: 2023-12-09 | Discharge: 2023-12-09 | Disposition: A | Source: Ambulatory Visit | Attending: Internal Medicine | Admitting: Internal Medicine

## 2023-12-09 DIAGNOSIS — Z1231 Encounter for screening mammogram for malignant neoplasm of breast: Secondary | ICD-10-CM | POA: Diagnosis not present
